# Patient Record
Sex: Male | Born: 1960 | Race: White | Hispanic: No | Marital: Married | Smoking: Former smoker
Health system: Southern US, Community
[De-identification: ages and names within clinical notes are randomized; demographics above are authoritative.]

## PROBLEM LIST (undated history)

## (undated) DIAGNOSIS — K219 Gastro-esophageal reflux disease without esophagitis: Secondary | ICD-10-CM

## (undated) DIAGNOSIS — I214 Non-ST elevation (NSTEMI) myocardial infarction: Secondary | ICD-10-CM

## (undated) DIAGNOSIS — E349 Endocrine disorder, unspecified: Secondary | ICD-10-CM

## (undated) DIAGNOSIS — N529 Male erectile dysfunction, unspecified: Secondary | ICD-10-CM

## (undated) DIAGNOSIS — I25810 Atherosclerosis of coronary artery bypass graft(s) without angina pectoris: Secondary | ICD-10-CM

## (undated) DIAGNOSIS — K2270 Barrett's esophagus without dysplasia: Secondary | ICD-10-CM

## (undated) DIAGNOSIS — J411 Mucopurulent chronic bronchitis: Secondary | ICD-10-CM

## (undated) DIAGNOSIS — I10 Essential (primary) hypertension: Secondary | ICD-10-CM

## (undated) DIAGNOSIS — I208 Other forms of angina pectoris: Secondary | ICD-10-CM

## (undated) DIAGNOSIS — Z951 Presence of aortocoronary bypass graft: Principal | ICD-10-CM

## (undated) DIAGNOSIS — M109 Gout, unspecified: Secondary | ICD-10-CM

## (undated) DIAGNOSIS — I255 Ischemic cardiomyopathy: Secondary | ICD-10-CM

## (undated) DIAGNOSIS — I8390 Asymptomatic varicose veins of unspecified lower extremity: Secondary | ICD-10-CM

## (undated) DIAGNOSIS — R7302 Impaired glucose tolerance (oral): Secondary | ICD-10-CM

## (undated) DIAGNOSIS — Z87891 Personal history of nicotine dependence: Secondary | ICD-10-CM

## (undated) DIAGNOSIS — I2510 Atherosclerotic heart disease of native coronary artery without angina pectoris: Secondary | ICD-10-CM

## (undated) DIAGNOSIS — E785 Hyperlipidemia, unspecified: Secondary | ICD-10-CM

## (undated) HISTORY — DX: Atherosclerotic heart disease of native coronary artery without angina pectoris: I25.10

## (undated) HISTORY — DX: Endocrine disorder, unspecified: E34.9

## (undated) HISTORY — DX: Hyperlipidemia, unspecified: E78.5

## (undated) HISTORY — DX: Atherosclerosis of coronary artery bypass graft(s) without angina pectoris: I25.810

## (undated) HISTORY — DX: Non-ST elevation (NSTEMI) myocardial infarction: I21.4

## (undated) HISTORY — DX: Personal history of nicotine dependence: Z87.891

## (undated) HISTORY — DX: Barrett's esophagus without dysplasia: K22.70

## (undated) HISTORY — DX: Male erectile dysfunction, unspecified: N52.9

## (undated) HISTORY — DX: Presence of aortocoronary bypass graft: Z95.1

## (undated) HISTORY — DX: Mucopurulent chronic bronchitis: J41.1

## (undated) HISTORY — DX: Gastro-esophageal reflux disease without esophagitis: K21.9

## (undated) HISTORY — DX: Gout, unspecified: M10.9

## (undated) HISTORY — PX: ESOPHAGOGASTRODUODENOSCOPY: SHX1529

## (undated) HISTORY — DX: Impaired glucose tolerance (oral): R73.02

## (undated) HISTORY — DX: Asymptomatic varicose veins of unspecified lower extremity: I83.90

## (undated) HISTORY — DX: Essential (primary) hypertension: I10

---

## 2001-09-29 ENCOUNTER — Encounter: Payer: Self-pay | Admitting: *Deleted

## 2001-09-29 ENCOUNTER — Ambulatory Visit (HOSPITAL_COMMUNITY): Admission: RE | Admit: 2001-09-29 | Discharge: 2001-09-29 | Payer: Self-pay | Admitting: *Deleted

## 2004-01-30 DIAGNOSIS — Z951 Presence of aortocoronary bypass graft: Secondary | ICD-10-CM

## 2004-01-30 HISTORY — DX: Presence of aortocoronary bypass graft: Z95.1

## 2004-02-22 ENCOUNTER — Inpatient Hospital Stay (HOSPITAL_COMMUNITY): Admission: EM | Admit: 2004-02-22 | Discharge: 2004-03-03 | Payer: Self-pay | Admitting: Emergency Medicine

## 2004-02-22 DIAGNOSIS — I214 Non-ST elevation (NSTEMI) myocardial infarction: Secondary | ICD-10-CM

## 2004-02-22 DIAGNOSIS — I2510 Atherosclerotic heart disease of native coronary artery without angina pectoris: Secondary | ICD-10-CM

## 2004-02-22 HISTORY — DX: Non-ST elevation (NSTEMI) myocardial infarction: I21.4

## 2004-02-22 HISTORY — DX: Atherosclerotic heart disease of native coronary artery without angina pectoris: I25.10

## 2004-02-27 HISTORY — PX: CORONARY ARTERY BYPASS GRAFT: SHX141

## 2004-03-20 ENCOUNTER — Encounter: Admission: RE | Admit: 2004-03-20 | Discharge: 2004-03-20 | Payer: Self-pay | Admitting: Cardiothoracic Surgery

## 2004-08-07 ENCOUNTER — Ambulatory Visit (HOSPITAL_COMMUNITY): Admission: RE | Admit: 2004-08-07 | Discharge: 2004-08-07 | Payer: Self-pay | Admitting: Cardiology

## 2005-05-08 ENCOUNTER — Encounter: Admission: RE | Admit: 2005-05-08 | Discharge: 2005-05-08 | Payer: Self-pay | Admitting: Cardiology

## 2005-05-21 ENCOUNTER — Ambulatory Visit: Payer: Self-pay | Admitting: Internal Medicine

## 2005-06-11 ENCOUNTER — Encounter (INDEPENDENT_AMBULATORY_CARE_PROVIDER_SITE_OTHER): Payer: Self-pay | Admitting: Specialist

## 2005-06-11 ENCOUNTER — Ambulatory Visit: Payer: Self-pay | Admitting: Internal Medicine

## 2007-06-20 ENCOUNTER — Emergency Department (HOSPITAL_COMMUNITY): Admission: EM | Admit: 2007-06-20 | Discharge: 2007-06-21 | Payer: Self-pay | Admitting: *Deleted

## 2007-12-30 DIAGNOSIS — R7302 Impaired glucose tolerance (oral): Secondary | ICD-10-CM

## 2007-12-30 HISTORY — DX: Impaired glucose tolerance (oral): R73.02

## 2008-06-20 LAB — HEMOGLOBIN A1C: A1c: 5.9

## 2009-12-29 HISTORY — PX: VEIN SURGERY: SHX48

## 2010-12-22 ENCOUNTER — Inpatient Hospital Stay (HOSPITAL_COMMUNITY)
Admission: EM | Admit: 2010-12-22 | Discharge: 2010-12-26 | Payer: Self-pay | Source: Home / Self Care | Attending: Cardiology | Admitting: Cardiology

## 2010-12-25 HISTORY — PX: CORONARY ANGIOPLASTY WITH STENT PLACEMENT: SHX49

## 2011-03-10 LAB — BASIC METABOLIC PANEL
BUN: 7 mg/dL (ref 6–23)
BUN: 8 mg/dL (ref 6–23)
Chloride: 102 mEq/L (ref 96–112)
Chloride: 104 mEq/L (ref 96–112)
Chloride: 105 mEq/L (ref 96–112)
Creatinine, Ser: 0.9 mg/dL (ref 0.4–1.5)
Creatinine, Ser: 0.98 mg/dL (ref 0.4–1.5)
GFR calc Af Amer: >60 mL/min (ref >60)
GFR calc non Af Amer: >60 mL/min (ref >60)
GFR calc non Af Amer: >60 mL/min (ref >60)
Glucose, Bld: 105 mg/dL — ABNORMAL HIGH (ref 70–99)
Potassium: 3.7 mEq/L (ref 3.5–5.1)
Potassium: 3.9 mEq/L (ref 3.5–5.1)
Potassium: 3.9 mEq/L (ref 3.5–5.1)
Sodium: 140 mEq/L (ref 135–145)

## 2011-03-10 LAB — COMPREHENSIVE METABOLIC PANEL
ALT: 30 U/L (ref 0–53)
AST: 59 U/L — ABNORMAL HIGH (ref 0–37)
Alkaline Phosphatase: 61 U/L (ref 39–117)
CO2: 23 mEq/L (ref 19–32)
Calcium: 8.6 mg/dL (ref 8.4–10.5)
GFR calc Af Amer: >60 mL/min (ref >60)
GFR calc non Af Amer: >60 mL/min (ref >60)
Glucose, Bld: 99 mg/dL (ref 70–99)
Potassium: 4 mEq/L (ref 3.5–5.1)
Sodium: 138 mEq/L (ref 135–145)
Total Protein: 6.1 g/dL (ref 6.0–8.3)

## 2011-03-10 LAB — LIPID PANEL
Cholesterol: 190 mg/dL (ref 0–200)
HDL: 36 mg/dL — ABNORMAL LOW (ref >39)
LDL Cholesterol: 111 mg/dL — ABNORMAL HIGH (ref 0–99)
Total CHOL/HDL Ratio: 5.3 RATIO
VLDL: 43 mg/dL — ABNORMAL HIGH (ref 0–40)

## 2011-03-10 LAB — POCT I-STAT, CHEM 8
BUN: 8 mg/dL (ref 6–23)
Calcium, Ion: 1.12 mmol/L (ref 1.12–1.32)
Chloride: 104 mEq/L (ref 96–112)
Creatinine, Ser: 1 mg/dL (ref 0.4–1.5)
Glucose, Bld: 105 mg/dL — ABNORMAL HIGH (ref 70–99)
TCO2: 28 mmol/L (ref 0–100)

## 2011-03-10 LAB — CBC
HCT: 35.2 % — ABNORMAL LOW (ref 39.0–52.0)
HCT: 36.9 % — ABNORMAL LOW (ref 39.0–52.0)
HCT: 36.9 % — ABNORMAL LOW (ref 39.0–52.0)
HCT: 37.8 % — ABNORMAL LOW (ref 39.0–52.0)
HCT: 41 % (ref 39.0–52.0)
Hemoglobin: 12.7 g/dL — ABNORMAL LOW (ref 13.0–17.0)
Hemoglobin: 14 g/dL (ref 13.0–17.0)
MCH: 28.1 pg (ref 26.0–34.0)
MCH: 28.5 pg (ref 26.0–34.0)
MCHC: 33.5 g/dL (ref 30.0–36.0)
MCV: 83.5 fL (ref 78.0–100.0)
MCV: 83.8 fL (ref 78.0–100.0)
MCV: 83.9 fL (ref 78.0–100.0)
MCV: 84.7 fL (ref 78.0–100.0)
MCV: 84.8 fL (ref 78.0–100.0)
Platelets: 181 10*3/uL (ref 150–400)
Platelets: 197 10*3/uL (ref 150–400)
RBC: 4.4 MIL/uL (ref 4.22–5.81)
RBC: 4.42 MIL/uL (ref 4.22–5.81)
RBC: 4.46 MIL/uL (ref 4.22–5.81)
RDW: 13.4 % (ref 11.5–15.5)
RDW: 13.4 % (ref 11.5–15.5)
RDW: 13.4 % (ref 11.5–15.5)
WBC: 8.1 10*3/uL (ref 4.0–10.5)
WBC: 8.2 10*3/uL (ref 4.0–10.5)
WBC: 8.3 10*3/uL (ref 4.0–10.5)
WBC: 8.9 10*3/uL (ref 4.0–10.5)

## 2011-03-10 LAB — HEMOCCULT GUIAC POC 1CARD (OFFICE): Fecal Occult Bld: NEGATIVE

## 2011-03-10 LAB — DRUGS OF ABUSE SCREEN W/O ALC, ROUTINE URINE
Barbiturate Quant, Ur: NEGATIVE
Benzodiazepines.: NEGATIVE
Cocaine Metabolites: NEGATIVE
Methadone: NEGATIVE
Opiate Screen, Urine: NEGATIVE
Phencyclidine (PCP): NEGATIVE

## 2011-03-10 LAB — HEPARIN LEVEL (UNFRACTIONATED)
Heparin Unfractionated: 0.12 IU/mL — ABNORMAL LOW (ref 0.30–0.70)
Heparin Unfractionated: 0.3 IU/mL (ref 0.30–0.70)
Heparin Unfractionated: 0.36 IU/mL (ref 0.30–0.70)

## 2011-03-10 LAB — POCT CARDIAC MARKERS
Myoglobin, poc: 317 ng/mL (ref 12–200)
Troponin i, poc: 0.12 ng/mL — ABNORMAL HIGH (ref 0.00–0.09)
Troponin i, poc: <0.05 ng/mL (ref 0.00–0.09)

## 2011-03-10 LAB — CK TOTAL AND CKMB (NOT AT ARMC)
CK, MB: 40.8 ng/mL (ref 0.3–4.0)
Relative Index: 9.2 — ABNORMAL HIGH (ref 0.0–2.5)
Total CK: 578 U/L — ABNORMAL HIGH (ref 7–232)

## 2011-03-10 LAB — DIFFERENTIAL
Basophils Absolute: 0.1 10*3/uL (ref 0.0–0.1)
Eosinophils Relative: 7 % — ABNORMAL HIGH (ref 0–5)
Lymphocytes Relative: 33 % (ref 12–46)
Lymphs Abs: 2.7 10*3/uL (ref 0.7–4.0)
Monocytes Absolute: 0.5 10*3/uL (ref 0.1–1.0)
Monocytes Relative: 6 % (ref 3–12)
Neutro Abs: 4.4 10*3/uL (ref 1.7–7.7)

## 2011-03-10 LAB — MRSA PCR SCREENING
MRSA by PCR: NEGATIVE
MRSA by PCR: NEGATIVE

## 2011-03-10 LAB — CARDIAC PANEL(CRET KIN+CKTOT+MB+TROPI)
Relative Index: 14.5 — ABNORMAL HIGH (ref 0.0–2.5)
Relative Index: 2.3 (ref 0.0–2.5)
Relative Index: 6.5 — ABNORMAL HIGH (ref 0.0–2.5)
Total CK: 1282 U/L — ABNORMAL HIGH (ref 7–232)
Total CK: 835 U/L — ABNORMAL HIGH (ref 7–232)
Troponin I: 5.3 ng/mL (ref 0.00–0.06)

## 2011-03-10 LAB — URINALYSIS, ROUTINE W REFLEX MICROSCOPIC
Glucose, UA: NEGATIVE mg/dL
Hgb urine dipstick: NEGATIVE
Ketones, ur: NEGATIVE mg/dL
Protein, ur: NEGATIVE mg/dL
Urobilinogen, UA: 0.2 mg/dL (ref 0.0–1.0)

## 2011-03-10 LAB — TROPONIN I: Troponin I: 11.05 ng/mL (ref 0.00–0.06)

## 2011-03-10 LAB — PROTIME-INR
INR: 1.02 (ref 0.00–1.49)
Prothrombin Time: 13.6 seconds (ref 11.6–15.2)

## 2011-03-10 LAB — HEMOGLOBIN A1C: Hgb A1c MFr Bld: 6 % — ABNORMAL HIGH (ref <5.7)

## 2011-04-28 ENCOUNTER — Ambulatory Visit
Admission: RE | Admit: 2011-04-28 | Discharge: 2011-04-28 | Disposition: A | Discharge status: Home / Self Care | Payer: 59 | Source: Ambulatory Visit | Attending: Cardiology | Admitting: Cardiology

## 2011-04-28 ENCOUNTER — Other Ambulatory Visit: Payer: Self-pay | Admitting: Cardiology

## 2011-04-28 DIAGNOSIS — Z01811 Encounter for preprocedural respiratory examination: Secondary | ICD-10-CM

## 2011-04-28 DIAGNOSIS — R0602 Shortness of breath: Secondary | ICD-10-CM

## 2011-05-01 ENCOUNTER — Observation Stay (HOSPITAL_COMMUNITY)
Admission: RE | Admit: 2011-05-01 | Discharge: 2011-05-02 | Disposition: A | Discharge status: Home / Self Care | Payer: 59 | Source: Ambulatory Visit | Attending: Cardiology | Admitting: Cardiology

## 2011-05-01 DIAGNOSIS — I10 Essential (primary) hypertension: Secondary | ICD-10-CM | POA: Insufficient documentation

## 2011-05-01 DIAGNOSIS — I25810 Atherosclerosis of coronary artery bypass graft(s) without angina pectoris: Principal | ICD-10-CM | POA: Insufficient documentation

## 2011-05-01 DIAGNOSIS — I872 Venous insufficiency (chronic) (peripheral): Secondary | ICD-10-CM | POA: Insufficient documentation

## 2011-05-01 DIAGNOSIS — E785 Hyperlipidemia, unspecified: Secondary | ICD-10-CM | POA: Insufficient documentation

## 2011-05-01 HISTORY — PX: CARDIAC CATHETERIZATION: SHX172

## 2011-05-02 LAB — BASIC METABOLIC PANEL
BUN: 8 mg/dL (ref 6–23)
CO2: 26 mEq/L (ref 19–32)
Calcium: 8.9 mg/dL (ref 8.4–10.5)
Chloride: 105 mEq/L (ref 96–112)
Creatinine, Ser: 0.88 mg/dL (ref 0.4–1.5)
GFR calc Af Amer: >60 mL/min (ref >60)
GFR calc non Af Amer: >60 mL/min (ref >60)
Glucose, Bld: 114 mg/dL — ABNORMAL HIGH (ref 70–99)
Potassium: 4.1 mEq/L (ref 3.5–5.1)
Sodium: 137 mEq/L (ref 135–145)

## 2011-05-02 LAB — CBC
MCH: 27 pg (ref 26.0–34.0)
MCHC: 33.2 g/dL (ref 30.0–36.0)
MCV: 81.3 fL (ref 78.0–100.0)
Platelets: 162 10*3/uL (ref 150–400)
RBC: 4.71 MIL/uL (ref 4.22–5.81)
RDW: 14.3 % (ref 11.5–15.5)

## 2011-05-15 NOTE — Cardiovascular Report (Signed)
Calvin Francis, Calvin Francis                ACCOUNT NO.:  [id number]  MEDICAL RECORD NO.:  [id number]           PATIENT TYPE:  O  LOCATION:  6531                         FACILITY:  [facility]  PHYSICIAN:  Thereasa Solo. Little, M.D. DATE OF BIRTH:  May 12, 1961  DATE OF PROCEDURE:  05/01/2011 DATE OF DISCHARGE:                           CARDIAC CATHETERIZATION   INDICATIONS FOR TEST:  This 50 year old male had bypass surgery in 2005. He had previous loss of a vein graft to his right coronary artery with left-to-right collaterals.  He was hospitalized in December 2011 with unstable angina, had a Vision stent placed to the vein graft to his OM system.  Unfortunately, he continued to have angina at least 3 times a week to his nitroglycerin responsive, because of this he was brought in for repeat cardiac catheterization.  PROCEDURE:  After obtaining informed consent, the patient was prepped and draped in the usual sterile fashion exposing the right groin, following local anesthetic with 1% Xylocaine the Seldinger technique was employed and a 5-French introducer sheath was placed in the right femoral artery, graft visualization x4, and left coronary arteriography was performed.  COMPLICATIONS:  None.  TOTAL CONTRAST USED:  115 mL  RESULTS:  Central aortic pressure was 119/79.  CORONARY ARTERIOGRAPHY:  The right coronary artery was occluded on previous cath. 1. No ventriculogram was performed. 2. Vein graft to the RCA was a 100% occluded his ostium. 3. Saphenous vein graft to OM #1 was occluded just past the ostium and     there was a small B-type area leading into the vein graft.  This is     the same vein graft and underwent intervention in December 2011 and     you could see a stent in the more proximal portion of this vessel. 4. Free radial to OM #2, this graft was widely patent.  OM #2 was free     of disease. 5. Internal mammary artery to the LAD.  The internal mammary artery     was patent.   The LAD itself, however , was small with 70 and 80%     sequential areas in the mid and distal segment.  This area is too     small for any type of intervention.  This is unchanged from his     cath December 2011.  1. Left main normal, bifurcating 2. Circumflex.  The circumflex was 95% occluded at the takeoff of OM     #1.  OM #1 itself was a totally occluded.  OM #2 was not visualized     to the native circulation and the ongoing circumflex stayed in the     AV groove and was small.  There were left-to-right collaterals     noted. 3. LAD.  The LAD was a 100% occluded after the first diagonal and     septal perforator.  The proximal LAD was free of significant     disease.  The diagonal was relatively long, but small in diameter     with moderate irregularities.  Because of what appeared to be a more recent obstruction of the vein  graft to the OM #1, the decision was made to try to intervene upon this. The patient was given IV Angiomax, had an ACT of 393, the system was upgraded to 6-French.  A variety of guide catheter was used, a JR-3.5, a JR-4 and LCB and finally an Amplatz catheter which was the best of the guide catheters.  A short Luge wire of [employer] and finally Miracle Brothers wire was used. The Miracle Brother wire with the Amplatz catheter gave me adequate backup, so that I could place the wire to pass the obstruction about 10 mm.  At this point, the guidewire would stop and back out the guide catheter.  I was able to pass into this area about 5 different times.  I was never able to visualize any type of channel or canal that had been accomplished with this wire and I could never see any distal flow down the graft.  At this point, the procedure was terminated.  There was no evidence of a dissection and there was no evidence of any perforation.  The patient will be treated medically.  We will start him on Ranexa 500 mg in addition to his outpatient medicines.           ______________________________ Thereasa Solo Little, M.D.     ABL/MEDQ  D:  05/01/2011  T:  05/01/2011  Job:  [id number]  cc:   Verline Lema  Electronically Signed by Julieanne Manson M.D. on 05/15/2011 03:51:17 PM

## 2011-05-15 NOTE — Discharge Summary (Signed)
NAMEALMON, Calvin Francis                ACCOUNT NO.:  [id number]  MEDICAL RECORD NO.:  [id number]           Francis TYPE:  O  LOCATION:  6531                         FACILITY:  [facility]  PHYSICIAN:  Thereasa Solo. Little, M.D. DATE OF BIRTH:  1961-02-17  DATE OF ADMISSION:  05/01/2011 DATE OF DISCHARGE:  05/02/2011                              DISCHARGE SUMMARY   DISCHARGE DIAGNOSES: 1. Recurrent angina. 2. Coronary artery disease with bypass grafting in 2005.     a.     Known loss of vein graft to Calvin right coronary artery with      left-to-right collaterals.     b.     Vision stent placed to Calvin vein graft to his obtuse marginal      in December 2011.     c.     Unsuccessful percutaneous coronary intervention to Calvin vein      graft to obtuse marginal 1 medical therapy now. 3. Premature ventricular contractions with increased dose of     metoprolol. 4. Hypertension. 5. Dyslipidemia. 6. Venous disease with improved venous insufficiency to his lower     extremity.  Discharge condition is stable.  PROCEDURES:  Combined left heart cath and attempted PCI by Dr. Julieanne Manson.  HOSPITAL COURSE:  Calvin Francis was brought in electively for cardiac catheterization to further evaluate his coronary anatomy as he was having increasing episodes of Calvin angina with chest pressure, shortness of breath with any type of sustained activity occurring at least 3 times a week and occasionally nitroglycerin responsive.  There was a crescendo- type response of Calvin angina.  It was felt due to his unknown disease and vein graft dysfunction after his bypass grafting, Calvin cath would be Calvin best test for Calvin Francis.  He was brought in, was found to have further graft dysfunction, PCI was attempted but was unsuccessful.  Please see Dr. Fredirick Maudlin dictated note.  Calvin Francis was able to ambulate Calvin next morning without complications.  We did add Ranexa to his medical regimen.  He did have PVCs on his telemetry  monitor couplets and his Lopressor was increased to 37.5 mg twice a day.  DISCHARGE INSTRUCTIONS: 1. Increase activity slowly.  May shower.  No lifting for 3 days.  No     driving for 3 days.  May return to work May 07, 2011. 2. Low-sodium heart-healthy diet. 3. Pick up Ranexa samples at our office. 4. Wash cath site with soap and water.  Call if any bleeding, swelling     or drainage. 5. Follow up with Dr. Clarene Duke May 14, 2011, at 11:30 a.m.  DISCHARGE MEDICATIONS:  See medication reconciliation sheet.  Calvin Francis was seen by Dr. Bryan Lemma on Calvin morning of discharge.  LABORATORY FINDINGS:  Sodium 137, potassium 4.1, BUN 8, creatinine 0.88, glucose 114.  Hemoglobin 12.7, hematocrit 38.3, platelets 162 and WBC 7.5.  Blood pressure 118/79, pulse 69, respiratory rate is 11 and temperature 97.6.  Oxygen saturation on room air was 96%.  Heart regular rate and rhythm.  S1 and S2 without murmur, gallop, rub or click.  Lungs are clear  to auscultation bilaterally.  Abdomen is soft, nontender. Positive bowel sounds.  Extremities, left lower extremity varicose veins.  Right groin site without hematoma.  PLAN:  Medical therapy as stated.     Darcella Gasman. Ingold, N.P.   ______________________________ Thereasa Solo Little, M.D.    LRI/MEDQ  D:  05/02/2011  T:  05/02/2011  Job:  [id number]  Electronically Signed by Nada Boozer N.P. on 05/05/2011 03:46:24 PM Electronically Signed by Julieanne Manson M.D. on 05/15/2011 03:51:15 PM

## 2011-05-16 NOTE — Cardiovascular Report (Signed)
NAME:  Calvin Francis, Calvin Francis                          ACCOUNT NO.:  [id number]   MEDICAL RECORD NO.:  [id number]                   PATIENT TYPE:  INP   LOCATION:  4734                                 FACILITY:  [facility]   PHYSICIAN:  Thereasa Solo. Little, M.D.              DATE OF BIRTH:  07-06-1961   DATE OF PROCEDURE:  02/23/2004  DATE OF DISCHARGE:                              CARDIAC CATHETERIZATION   INDICATIONS FOR TEST:  This 50 year old male on February 20, 2004 developed  chest discomfort that resolved when he went to bed.  He awoke on February 24  and had reoccurrence of the chest discomfort that sounded relatively classic  for angina.  He presented to the emergency room  and was pain free.  His  cardiac enzymes were low positive and his EKG was completely normal.  He was  started on IV integrilin and heparin last night and brought to the cath lab  this morning after sustaining what appears to be a non-Q-wave myocardial  infarction.  His only risk factor is cigarette smoking.   After obtaining informed consent, the patient was prepped and draped in the  usual sterile fashion.  Following local anesthetic with 1% Xylocaine, the  Seldinger technique was employed and a 5 [place] introducer sheath was placed  into his right femoral artery.  Left and right coronary arteriography,  ventriculography, distal aortogram and evaluation of the internal mammary  artery was done.   RESULTS:   HEMODYNAMIC MONITORING:  Central aortic pressure was 120/82.  Left  ventricular pressure was 121/16.   VENTRICULOGRAPHY:  Ventriculography in the RAO projection revealed the  inferior wall to be hypokinetic.  The anterior wall appeared to have normal  to slightly hyperdynamic function.  The ejection fraction was 45-50%.  End-  diastolic pressure was 20.   DISTAL AORTOGRAM:  No abdominal aortic aneurysm was seen.   LEFT SUBCLAVIAN/INTERNAL MAMMARY ARTERY:  Subclavian was without disease.  Internal mammary  artery was widely patent.   CORONARY ARTERIOGRAPHY:  Calcification in the proximal LAD and left main was  seen on fluoroscopy.   1. Left main normal.  2. LAD:  The left anterior descending cross the apex of the heart.  In the     mid portion of the LAD at the bifurcation of the second diagonal was an     area of 60% narrowing involving both the LAD and the diagonal itself.     The distal portion of the LAD had mild irregularities.  The first     diagonal had an area of 40% narrowing.  3. Circumflex:  The circumflex had some aneurysmal dilatation in its mid     portion.  There was a long proximal segment of 70-80% narrowing.  OM-1     was 100% occluded and was probably the culprit vessel for his event 48     hours earlier.  The ongoing circumflex after  OM-1 had an area of 80%     narrowing and this gave rise to an OM-2.  4. Right coronary artery:  The right coronary artery was 100% occluded in     its mid portion.  There was a large left-to-right collateral bed seen.   CONCLUSION:  1. Three-vessel coronary disease with total occlusion of the right with left-     to-right collaterals, high grade stenosis in the left anterior     descending, high grade stenosis in the circumflex and total occlusion of     a large marginal vessel.  2. Mild left ventricular dysfunction with an ejection fraction of around 45-     50%.  3. Patent internal mammary artery.   Despite his young age, the diffuseness of his disease will require bypass  surgery.  CVTS will see the patient.  I have restarted the heparin and will  continue integrilin.                                               Thereasa Solo. Little, M.D.    ABL/MEDQ  D:  02/23/2004  T:  02/24/2004  Job:  [id number]   cc:   CVTS   [facility]

## 2011-05-16 NOTE — Op Note (Signed)
NAME:  CLARE, CASTO NO.:  [id number]   MEDICAL RECORD NO.:  [id number]                   PATIENT TYPE:  INP   LOCATION:  2314                                 FACILITY:  [facility]   PHYSICIAN:  Kerin Perna III, M.D.           DATE OF BIRTH:  [birth year]   DATE OF PROCEDURE:  02/27/2004  DATE OF DISCHARGE:                                 OPERATIVE REPORT   OPERATION:  Coronary artery bypass grafting x 4 (left internal mammary  artery to LAD, right radial artery graft to OM2, saphenous vein graft to  OM1, saphenous vein graft to right coronary artery ).   Dictation ended at this point.                                               Mikey Bussing, M.D.    PV/MEDQ  D:  02/27/2004  T:  02/28/2004  Job:  [id number]

## 2011-05-16 NOTE — Op Note (Signed)
NAME:  Calvin Francis, Calvin Francis NO.:  [id number]   MEDICAL RECORD NO.:  [id number]                   PATIENT TYPE:  INP   LOCATION:  2314                                 FACILITY:  [facility]   PHYSICIAN:  Kerin Perna III, M.D.           DATE OF BIRTH:  12/02/1961   DATE OF PROCEDURE:  02/27/2004  DATE OF DISCHARGE:                                 OPERATIVE REPORT   OPERATION PERFORMED:  Coronary artery bypass grafting time four (left  internal mammary artery to left anterior descending, right radial artery  graft to obtuse marginal 2, saphenous vein graft to obtuse marginal 1,  saphenous vein graft to right coronary artery).   PREOPERATIVE DIAGNOSIS:  Class IV unstable angina with severe three-vessel  coronary artery disease.   POSTOPERATIVE DIAGNOSIS:  Class IV unstable angina with severe three-vessel  coronary artery disease.   SURGEON:  Kerin Perna, M.D.   ASSISTANT:  1. Salvatore Decent. Dorris Fetch, M.D.  2. Rowe Clack, P.A.-C.   ANESTHESIA:  General.   ANESTHESIOLOGIST:  Zenon Mayo, MD   INDICATIONS FOR PROCEDURE:  The patient is a 50 year old white male smoker  who presented with symptoms of unstable angina and he had mild elevation of  cardiac enzymes.  Cardiac catheterization demonstrated severe three-vessel  coronary artery disease with ejection fraction of 40%.  He was felt to be a  candidate for surgical revascularization.  Prior to surgery, I examined the  patient in his hospital room and reviewed the results of the cardiac  catheterization with the patient and his family.  I discussed the  indications and expected benefits of coronary artery bypass surgery for  treatment of his coronary artery disease.  I discussed the alternatives to  surgical therapy for treatment of his coronary artery disease.  I reviewed  with the patient the major aspects of the proposed procedure including the  choice of conduit to include radial artery,  mammary artery, and saphenous  vein, the location of the surgical incisions, the use of general anesthesia,  and cardiopulmonary bypass, and the expected postoperative hospital  recovery.  He understood that we would delay the surgery for four days to  allow the effect of his Plavix load to resolve to reduce bleeding  complications.  He understood the risks associated with this operation to  him including the risks of MI, CVA, bleeding, blood transfusion requirement,  infection and death.  The patient understood these implications for the  surgery and agreed to proceed with the operation as planned under what I  felt was an informed consent.   OPERATIVE FINDINGS:  The mammary artery was a good vessel with excellent  flow.  The right radial artery was a good vessel with excellent flow.  The  saphenous vein was harvested from the right leg using an open technique.  There is very little saphenous vein remaining in right leg and the left  leg  saphenous vein  is severely diseased with varicosities.  The patient  received a unit of platelets in the operating room for post pump  coagulopathy consistent with his preoperative use of Plavix.  He received  blood transfusion in the operating room for a hemoglobin of 7 g.  The  diagonal vessel was too small and diffusely diseased to graft.   DESCRIPTION OF PROCEDURE:  The patient was brought to the operating room and  placed supine on the operating table where general anesthesia was induced  under invasive hemodynamic monitoring.  The chest, abdomen and right arm  were prepped with Betadine and draped as a sterile field.  The radial artery  was harvested using a right forearm incision using the [employer] scalpel.  Prior to removing the radial artery, the patency of the palmar arch was  confirmed with the use of intraoperative Doppler ultrasound.  Radial artery  was harvested and flushed with a papaverine heparin solution.  The arm  incision was  closed in two layers.  A sterile dressing was applied and the  right arm was tucked to the patient's side.   A sternal incision was then made as the saphenous vein was harvested from  the right thigh.  The left internal mammary artery was harvested as a  pedicle graft from its origin at the subclavian vessels and was a good  vessel with excellent flow.  Heparin was administered and the ACT was  documented as being therapeutic.  The sternal retractor was placed.  The  pericardium was opened. Through pursestrings placed in the ascending aorta  and right atrium, the patient was cannulated and placed on bypass and cooled  to 32 degrees.  The coronaries were identified and grafted.  The LAD had  diffuse disease.  The two circumflex vessels were intramyocardial.  The  right coronary artery had diffuse disease and distally, the posterior  descending was too small to graft and the vein graft was placed to the  distal right coronary artery proper.  The mammary artery, radial artery, and  saphenous vein grafts were prepared for the distal anastomoses and the  cardioplegia cannula was placed in the ascending aorta.  The patient was  cooled to 30 degrees.  The aortic cross-clamp was applied.  750 mL of cold  blood cardioplegia was delivered to the aortic root with good cardioplegic  arrest and septal temperature dropped less than 12 degrees.  Topical iced  saline was used to augment myocardial preservation and a pericardial  insulator pad was used to protect the left phrenic nerve.  The distal  coronary anastomoses were then performed.   The first distal anastomosis was to the distal right.  This was totally  occluded proximally.  There was a 1.7 mm sclerotic vessel.  Reversed  saphenous vein was sewn end-to-side with running 7-0 Prolene.  There was  good flow through the graft.  The second distal anastomosis was the OM1. This was a totally occluded intramyocardial vessel.  It was heavily  diseased.   A reversed saphenous vein was sewn end-to-side with running 7-0  Prolene.  There was good flow through the graft.  Cardioplegia was redosed.  The third distal anastomosis was to the obtuse marginal 2.  This was a 1.7  mm vessel with proximal 90% stenosis.  It was intramyocardial.  A radial  artery graft was sewn end-to-side with running 8-0 Prolene. There was good  flow to the graft.  The fourth distal anastomosis was the  distal third of  the LAD.  This was a thickened sclerotic vessel with diffuse disease.  The  left internal mammary artery pedicle was brought through an opening created  in the left lateral pericardium and was brought down onto the LAD and sewn  end-to-side with running 8-0 Prolene.  There was excellent flow through the  anastomosis with immediate rise in septal temperature after release of the  pedicle clamp on the mammary artery.  The mammary pedicle was secured to the  epicardium and the aortic cross-clamp was removed.   The heart resumed a spontaneous rhythm.  Using a partial occlusion clamp,  the proximal anastomoses were placed using the radial artery for the  superior graft and two vein grafts for the middle and lower grafts.  The  partial clamp was removed.  The vein graft and radial artery graft were  perfused.  All grafts had good flow and hemostasis was documented at the  proximal and distal sites.  The patient was rewarmed to 37 degrees.  The  lungs were re-expanded and the ventilator was resumed.  The patient was  weaned from bypass without difficulty.  Blood pressure and cardiac output  were stable.  Protamine was administered without adverse reaction. Cannulas  were removed.  The mediastinum was irrigated with warm antibiotic  irrigation.  The patient remained stable.  The leg incision was irrigated  and closed in a standard fashion.  The superior pericardial fat was closed  over the aorta.  Two mediastinal and a left pleural chest tube were placed  and  brought out through separate incisions.  The sternum was closed with  interrupted steel wire. The pectoralis fascia was closed with a running #1  Vicryl.  The subcutaneous and skin layers were closed with a running Vicryl  and sterile dressings were applied.  Total bypass time was 130 minutes.  Cross-clamp time was 55 minutes.                                               Mikey Bussing, M.D.    PV/MEDQ  D:  02/27/2004  T:  02/28/2004  Job:  [id number]   cc:   Thereasa Solo. Little, M.D.  [address]  [address]  [place]  [place] [address]  Fax: [phone]

## 2011-05-16 NOTE — Consult Note (Signed)
NAME:  ROBEY, MASSMANN NO.:  [id number]   MEDICAL RECORD NO.:  [id number]                   PATIENT TYPE:  INP   LOCATION:  4734                                 FACILITY:  [facility]   PHYSICIAN:  Mikey Bussing, M.D.           DATE OF BIRTH:  Oct 19, 1961   DATE OF CONSULTATION:  DATE OF DISCHARGE:                                   CONSULTATION   PHYSICIAN REQUESTING CONSULTATION:  Thereasa Solo. Little, M.D.   PRIMARY CARE PHYSICIAN:  [facility]).   CHIEF COMPLAINT:  Chest pain.   REASON FOR CONSULTATION:  Subendocardial MI with class 4 unstable angina and  three vessel coronary disease.   HISTORY OF PRESENT ILLNESS:  I was asked to evaluate this 50 year old white  male smoker for potential surgical coronary revascularization for recently  diagnosed severe three vessel coronary artery disease.  The patient has a  long history of GERD and keeps Tums at his bedside.  He presented to the  emergency room after experiencing nocturnal chest pain that was not relieved  by antacids or rest on the evening of February 22, 2004.  An EKG at the  emergency department showed no acute changes, however, his cardiac enzymes  were mildly positive, a CPK-MB 11.2 and a troponin of 0.3.  He was admitted  and placed on heparin, nitroglycerin and Integrilin and was given Plavix  times two doses.  He underwent cardiac catheterization today which  demonstrated ejection fraction of 40%, a proximal 80% stenosis of the  circumflex, occlusion of the OM 1, 75% stenosis of the LAD with diffuse  distal disease, and chronic occlusion of the right coronary.  Left  ventricular end diastolic pressure is 20 mm/Hg and there is no mitral  regurgitation.  Ejection fraction was 45%.  He was felt to be a candidate  for surgical coronary revascularization.   PAST MEDICAL HISTORY:  1. Positive smoker, one pack per day times 20 years.  2. Right hand dominant.  3. Long history  of GERD.  4. Varicose veins with severe venous insufficiency of the left lower leg.  5. No known drug allergies.   HOME MEDICATIONS:  Mobic 15 mg p.o. daily, vitamins.   SOCIAL HISTORY:  He smokes one pack of cigarettes per day and does not use  alcohol.  He is married and has children.  He works as a Curator at [employer].   FAMILY HISTORY:  Positive for diabetes and hypertension.  Negative for  premature coronary disease or relatives requiring bypass surgery.   REVIEW OF SYSTEMS:  CONSTITUTIONAL:  No systemic symptoms of fever or weight  loss.  ENT:  Review is negative for change in vision, headache, dizziness or  active dental disease.  No difficulty swallowing.  PULMONARY:  Review is  negative for thoracic trauma, productive cough or history of abnormal chest  x-ray.  CARDIAC:  Review is positive  for his angina.  No known prior history  of myocardial infarction or heart disease or cardiac murmur.  ABDOMINAL:  Review is negative for change in bowel habits, blood per rectum, jaundice or  hepatitis.  NEUROLOGICAL:  Review is negative for dysuria, hematuria or  kidney stones.  VASCULAR:  Review is positive for venous insufficiency of  the left lower leg, otherwise negative for claudication or TIA.  HEMATOLOGIC:  Review is negative for bleeding disorder or previous blood  transfusion.  NEUROLOGICAL:  Review is negative for stroke or seizure and he  is right hand dominant.   PHYSICAL EXAMINATION:  VITAL SIGNS:  The patient is 6 feet 2 inches and  weighs 220 pounds.  Blood pressure is 118/60, heart rate is 74 and regular  in sinus.  Oxygen saturation 95%.  GENERAL APPEARANCE:  General appearance is that of an anxious middle aged  white male in the hospital room following cardiac cath in no acute distress.  HEENT:  Exam is normocephalic.  Pharynx is clear.  NECK:  Without JVD, mass or carotid bruit.  LYMPHATICS:  Reveal no palpable supraclavicular or axillary adenopathy.   LUNGS:  Clear.  There is no thoracic deformity.  CARDIAC:  Exam reveals a regular rhythm without S3 gallop or murmur.  ABDOMINAL:  Exam is soft, nontender without mass.  EXTREMITIES:  Reveal no cyanosis, clubbing or edema.  VASCULAR:  Exam reveals 2+ pulses in all extremities.  Allen test is  negative in the right arm.  RECTAL:  Exam is deferred.  SKIN:  Without rash or lesions.  NEUROLOGICAL:  Exam is alert and oriented times three with full motor  function.   LABORATORY DATA:  I reviewed his coronary arteriograms with Dr. Clarene Duke and I  agree with interpretation of severe three vessel disease with occlusion of  the right coronary and occlusion of the OM 1.  His carotid Dopplers show no  significant carotid stenosis and his brachial artery pressures are equal  bilaterally.  Palmar arch studies indicate a patent palmar arch bilaterally.  His chest x-ray is pending.   PLAN:  The patient will benefit from surgical revascularization with  arteriograph to the LAD, circumflex, OM 1, and distal right coronary.  We  will schedule his surgery for March 1 to allow the effects of Plavix to  resolve.  I reviewed the indications and benefits of surgery with the  patient as well as the alternatives and the plan for surgery on March 1st.   Thank you for the consultation.                                               Mikey Bussing, M.D.    PV/MEDQ  D:  02/23/2004  T:  02/23/2004  Job:  [id number]   cc:   CVTS Office   [facility]   [facility]

## 2011-05-16 NOTE — Discharge Summary (Signed)
NAME:  Calvin Francis, Calvin Francis NO.:  [id number]   MEDICAL RECORD NO.:  [id number]                   PATIENT TYPE:  INP   LOCATION:  2016                                 FACILITY:  [facility]   PHYSICIAN:  Kerin Perna, M.D.               DATE OF BIRTH:  [birth year]   DATE OF ADMISSION:  02/22/2004  DATE OF DISCHARGE:  03/03/2004                                 DISCHARGE SUMMARY   REFERRING PHYSICIAN:  Thereasa Solo. Little, M.D.   PRIMARY ADMITTING DIAGNOSIS:  Chest pain.   ADDITIONAL/DISCHARGE DIAGNOSES:  1. Status post acute subendocardial myocardial infarction.  2. Class IV unstable angina.  3. Three vessel coronary artery disease.  4. History of tobacco abuse.  5. History of gastroesophageal reflux disease.  6. History of varicose veins with severe venous insufficiency of the left     leg.  7. Dyslipidemia.   PROCEDURES PERFORMED:  1. Cardiac catheterization.  2. Coronary artery bypass grafting x4 (left internal mammary artery to LAD,     right radial artery to second obtuse marginal, saphenous vein graft to     the first obtuse marginal, saphenous vein graft to the right coronary     artery).  3. Open saphenous vein harvest from right thigh.   HISTORY OF PRESENT ILLNESS:  The patient is a 50 year old white male who  presented to the emergency department on the date of this admission  complaining of substernal chest pain which awakened him from sleep at night.  He has a history of gastroesophageal reflux disease and takes Tums on a  fairly regular basis.  When he began to have chest pain that evening, he  took antacids without any relief.  Upon presentation to the emergency room,  he had an EKG which showed no acute changes.  However, his cardiac enzymes  were mildly positive with CPK-MB of 11.2 and a troponin of 0.3.  He has a  long history of tobacco use and because of his ongoing symptoms and past  medical history, he was admitted for further  evaluation and treatment.   HOSPITAL COURSE:  He was admitted to [facility] and  started on heparin, Integrilin and nitroglycerin.  He is also treated with  two doses of Plavix.  He underwent cardiac catheterization on February 23, 2004, and was found to have severe three vessel coronary artery disease with  an ejection fraction of 40% and proximal 80%stenosis  of the circumflex,  occlusion of the OM1, 75% stenosis of the LAD with diffuse distal disease  and chronic occlusion of the right coronary artery.  He was not felt to be a  good candidate for percutaneous intervention, therefore a cardiothoracic  surgery consultation was obtained.  The patient was seen by Dr. Donata Clay  and he felt that CABG was the best option.  After explanation of the risks,  benefits, and alternatives,  the patient agreed to proceed with surgery.  He  was taken to the operating room on February 27, 2004, and underwent CABG x4 as  described in detail above, performed by Dr. Donata Clay.  He tolerated the  procedure well and was transferred to the SICU in stable condition.  He was  able to be extubated shortly after surgery.   He was hemodynamically stable and doing well on postoperative day #1.  At  that time, he was started on Imdur for patency of his radial artery graft.  He was also started on Lasix for volume overload.  He was able to be  transferred to the floor later in the day on postoperative day #1.  Overall,  he has done very well postoperatively.  He has been started on lipid  lowering agents as well as beta-blocker and ACE inhibitor.  He has been  quite volume overloaded and has been aggressively diuresed.  At present, he  is down to about nine pounds above his preoperative weight and still has  some mild lower extremity edema on examination, but has been diuresing very  well.  Surgical sites were all healing well.  His right hand has remained  well perfused and has good ulnar pulse.   He has been ambulating in the halls  without difficulty.  He has been weaned from supplemental oxygen and is  maintaining O2 saturations of greater than 90% on room air.  He is  maintaining sinus rhythm and other vital signs have been stable.  He has  remained afebrile throughout his hospitalization.   His most recent labs from March 03, 2004, show potassium of 3.9 which has  been supplemented.  BUN and creatinine of 9 and 1.0, respectively.  Also CBC  from March 01, 2004, showed a hemoglobin of 9.4, hematocrit 27.4, white count  7.7, platelets 176.  His most recent chest x-ray showed mild left lower lobe  atelectasis but otherwise stable.  It was felt that since he has remained  stable and is doing well, he is ready for discharge home at this time.  He  has been seen by cardiology prior to discharge and they wish to start him on  Plavix in addition to aspirin and this has been ordered.   DISCHARGE MEDICATIONS:  1. Enteric coated aspirin 325 mg daily.  2. Imdur 15 mg daily.  3. Lasix 40 mg daily x1 week.  4. K-Dur 20 mEq daily x1 week.  5. Mavik 1 mg daily.  6. Toprol XL 50 mg daily.  7. Tricor 48 mg daily.  8. Zocor 20 mg daily.  9. Plavix 75 mg daily.  10.      Tylox one to two q.4h. p.r.n. for pain.   DISCHARGE INSTRUCTIONS:  1. He is to refrain from driving, heavy lifting or strenuous activity.  He     may continue ambulating daily and using his incentive spirometer.  He is     also encouraged to keep his legs elevated when he is not ambulating.  2. He will continue a low fat, low sodium diet.  3. He is asked to shower daily and clean his incisions with soap and water.   FOLLOW UP:  He will make an appointment to see Dr. Clarene Duke for follow-up in  two weeks. The CVTS office will contact him with appointments for staple  removal and wound recheck with the nurse in one week, and appointment with Dr. Donata Clay in three weeks.  Prior to his  appointment with Dr. Donata Clay, he will  have a chest x-ray at [facility] and will  bring his films to our office for Dr. Donata Clay to review.  In the interim,  if he experiences any problems or has questions, he is asked to contact our  office immediately.      Coral Ceo, P.A.                        Kerin Perna, M.D.    GC/MEDQ  D:  03/03/2004  T:  03/04/2004  Job:  [id number]   cc:   Thereasa Solo. Little, M.D.  [address]  [address]  [place]  [place] [address]  Fax: [phone]   [facility] [place])

## 2011-05-16 NOTE — Cardiovascular Report (Signed)
NAME:  KAYLEM, Calvin Francis NO.:  [id number]   MEDICAL RECORD NO.:  [id number]                   PATIENT TYPE:  OIB   LOCATION:  2899                                 FACILITY:  [facility]   PHYSICIAN:  Thereasa Solo. Little, M.D.              DATE OF BIRTH:  1961-02-18   DATE OF PROCEDURE:  08/07/2004  DATE OF DISCHARGE:  08/07/2004                              CARDIAC CATHETERIZATION   INDICATIONS FOR TEST:  Mr. Spackman is a 50 year old male who had bypass  surgery February 27, 2004.  At that time he had saphenous vein grafts to the RCA  and OM #1, free right radial graft to OM #2, and a left internal mammary  artery to his LAD.   He had had a nuclear study because of recurrent exertional chest discomfort.  This was unremarkable.  He has returned to work and started having pain  identical to what he had prior to his bypass surgery and because of this is  brought in for outpatient cardiac catheterization with graft visualization.   PROCEDURE:  After obtaining informed consent the patient was prepped and  draped in the usual sterile fashion.  Following local anesthetic 1%  Xylocaine the Seldinger technique was employed and a 5-French introducer  sheath was placed into the right femoral artery.  Left and right coronary  arteriography, ventriculography, aortic root injection, and graft  visualization were performed.   COMPLICATIONS:  None.   CONTRAST:  115 mL total.   EQUIPMENT:  5-French Judkins configuration catheters and all grafts  including the internal mammary artery were easily cannulated using the RCA  catheter.   RESULTS:  HEMODYNAMIC MONITORING:  Central aortic pressure was 137/92.  Left ventricular pressure was 138/12  and there was no aortic valve gradient noted at time of pullback.   VENTRICULOGRAPHY:  Ventriculography in the RAO projection using 25 mL/second was performed.  Marked ventricular ectopy occurred.  There appeared to be no wall motion  abnormality and the ejection fraction was approximately 50%.  The end-  diastolic pressure was 19.   Aortic root:  Aortic root injection was performed to verify closure of the  right coronary graft.  The root appeared to be normal in size and there was  no evidence of a right coronary graft being patent.   CORONARY ARTERIOGRAPHY:  On fluoroscopy calcification was seen in the proximal LAD and left main  area.  1. Left main normal.  It bifurcated.  2. LAD had mild irregularities in the proximal LAD and in the first     diagonal.  No high grade stenosis was seen.  The second OM was a     relatively smaller vessel with mild irregularities also.  The LAD itself     was 100% occluded after the second diagonal.  Left to right collaterals     were noted.  3. Circumflex 100% occluded in its mid portion  with only a string of an     ongoing circumflex.  No obtuse marginal vessels were visualized via the     native circumflex.  There was, however, some left to right collaterals     seen supplied by the ongoing circumflex.  4. Right coronary artery 100% occluded in its mid portion with several right     ventricular branches giving rise to some collaterals to the distal RCA.     In addition to the right to right collaterals there were also left to     right collaterals from both the LAD and the circumflex.   GRAFTS:  1. Saphenous vein graft to the RCA:  This graft was 100% occluded at the     aorta.  An aortic root injection failed to show the graft being patent     also.  2. Saphenous vein graft to OM #1:  This graft was patent.  There was a valve     in the distal segment, but no high grade stenosis.  OM #1 was free of     disease.  3. Free right radial graft to OM #2:  The graft was patent.  The OM #2 was     patent with no significant disease seen.  4. Left internal mammary artery to the LAD:  This internal mammary artery     was patent.  It was markedly tortuous and did a significant  Shepherd's     crook in its mid portion.  It inserted into the mid/distal portion of the     LAD and about 1 cm distal to the insertion site was an area of 60%     narrowing in the LAD.   CONCLUSION:  1. Low normal ejection fraction of approximately 50%.  2. Severe native disease with total occlusion of the right coronary artery,     circumflex, and distal left anterior descending.  3. Occlusion of the saphenous vein graft to the right coronary artery;     however, there were left to right and right to right collaterals to the     distal right coronary artery which was very small.  4. Moderate area of narrowing in the left anterior descending distal to the     insertion site of the internal mammary artery.   DISCUSSION:  In view of the occlusion of the RCA, this is clearly a  potential source of his pain.  The vessel is very small and this appears to  be identical to what his catheterization was prior to his bypass surgery.  I  suspect the distal RCA bed was too small to maintain graft patency.  The LAD  lesion is also worrisome.  It is downstream from the insertion site but  because of the marked tortuosity and Shepherd crook formation in the mid  portion of the internal mammary artery it would be very difficult and we  would be jeopardizing the graft to the LAD should we try percutaneous  intervention.   The plan for now is to allow the patient to be discharged to home and follow  up in my office tomorrow.  I will readjust his medical therapy and consider  him for EECP therapy.                                               Thereasa Solo.  Little, M.D.    ABL/MEDQ  D:  08/07/2004  T:  08/08/2004  Job:  [id number]

## 2011-07-18 LAB — TESTOSTERONE: Testosterone: 208.52

## 2011-07-18 LAB — PSA: PSA: 0.5

## 2011-07-18 LAB — COMPREHENSIVE METABOLIC PANEL
ALT: 21 U/L (ref 10–40)
AST: 24 U/L
BUN: 17 mg/dL (ref 4–21)
Sodium: 137 mmol/L (ref 137–147)

## 2011-07-18 LAB — TSH: TSH: 0.95 u[IU]/mL (ref 0.41–5.90)

## 2011-07-24 LAB — TESTOSTERONE: Testosterone, total: 178.55

## 2011-07-24 LAB — TESTOSTERONE, FREE: Testosterone, Free: 2.7

## 2011-08-15 HISTORY — PX: OTHER SURGICAL HISTORY: SHX169

## 2012-04-28 DIAGNOSIS — Z87891 Personal history of nicotine dependence: Secondary | ICD-10-CM

## 2012-04-28 HISTORY — DX: Personal history of nicotine dependence: Z87.891

## 2012-08-11 ENCOUNTER — Encounter: Payer: Self-pay | Admitting: Family Medicine

## 2012-08-11 ENCOUNTER — Ambulatory Visit (INDEPENDENT_AMBULATORY_CARE_PROVIDER_SITE_OTHER): Payer: 59 | Admitting: Family Medicine

## 2012-08-11 VITALS — BP 110/82 | HR 84 | Temp 97.7°F | Ht 74.0 in | Wt 249.0 lb

## 2012-08-11 DIAGNOSIS — E291 Testicular hypofunction: Secondary | ICD-10-CM

## 2012-08-11 DIAGNOSIS — K2270 Barrett's esophagus without dysplasia: Secondary | ICD-10-CM

## 2012-08-11 DIAGNOSIS — E785 Hyperlipidemia, unspecified: Secondary | ICD-10-CM

## 2012-08-11 DIAGNOSIS — E349 Endocrine disorder, unspecified: Secondary | ICD-10-CM

## 2012-08-11 DIAGNOSIS — I10 Essential (primary) hypertension: Secondary | ICD-10-CM

## 2012-08-11 DIAGNOSIS — I8390 Asymptomatic varicose veins of unspecified lower extremity: Secondary | ICD-10-CM

## 2012-08-11 DIAGNOSIS — Z87891 Personal history of nicotine dependence: Secondary | ICD-10-CM

## 2012-08-11 DIAGNOSIS — I25119 Atherosclerotic heart disease of native coronary artery with unspecified angina pectoris: Secondary | ICD-10-CM | POA: Insufficient documentation

## 2012-08-11 DIAGNOSIS — I2510 Atherosclerotic heart disease of native coronary artery without angina pectoris: Secondary | ICD-10-CM

## 2012-08-11 MED ORDER — TESTOSTERONE 50 MG/5GM (1%) TD GEL: 5.0000 g | Freq: Every day | TRANSDERMAL | Status: DC

## 2012-08-11 MED ORDER — ROSUVASTATIN CALCIUM 5 MG PO TABS: 5.0000 mg | ORAL_TABLET | Freq: Every day | ORAL | Status: DC

## 2012-08-11 NOTE — Assessment & Plan Note (Signed)
Prescribed testosterone gel by cards but pt states they want me to take over.  Will request records on dx of hypogonadism.

## 2012-08-11 NOTE — Assessment & Plan Note (Signed)
Stable off cigs.  Encouraged continued abstinence.

## 2012-08-11 NOTE — Patient Instructions (Addendum)
Sign release form for records from Dr. Clarene Duke [facility] cards) and Dr. Nita Sells [facility] clinic) Call Dr. Fredirick Maudlin office to see if you're due for follow up. Good to meet you today, return in 3-4 months for follow up, sooner if needed.

## 2012-08-11 NOTE — Assessment & Plan Note (Signed)
Chronic. Good control on current regimen, continue.

## 2012-08-11 NOTE — Progress Notes (Signed)
Subjective:    Patient ID: Calvin Francis, male    DOB: 1961-01-16, 51 y.o.   MRN: [id number]  HPI CC: new pt to establish  Prior saw Dr. Yehuda Budd then [facility].  Cards is Dr. Clarene Duke ,[facility].  Brings copy of records from home.  No questions/concerns today.  H/o MI x3 - on ranexa 1000mg  daily.  Cardiologist is Dr. Clarene Duke, sees every 3-4 months.  S/p 5v CABG 2005, stent placed 2011.  Pending rpt open heart surgery.  CVTS - Donata Clay.  No upcoming appt with Dr. Clarene Duke.  H/o smoking - off for 3 months, now on patch. H/o recurrent bronchitis.  HTN - mildly elevated, controlled on current meds. No h/o HLD per pt.  States has been on statin in past, didn't like how he felt on them.  Caffeine: 1 cup coffee in am Lives with wife, 2 dogs Grown children, 3 grandchildren Occupation: disability from cardiac status for last year, prior worked for [employer] Activity: walks driveway, limited by chest pain/SOB Diet: some water, fruits/vegetables occasionally  Preventative:  no recent blood work/CPE  Medications and allergies reviewed and updated in chart.  Past histories reviewed and updated if relevant as below. There is no problem list on file for this patient.  Past Medical History  Diagnosis Date  . Testosterone deficiency   . MI (myocardial infarction)     x 3  . GERD (gastroesophageal reflux disease)     on omeprazole  . Barrett's esophagus ~2008    s/p EGD several years ago with   . Varicose veins     L leg s/p surgery  . CAD (coronary atherosclerotic disease)     native and bypass  . Bronchitis, mucopurulent recurrent    Past Surgical History  Procedure Date  . Coronary artery bypass graft 02/27/04    5v  . Coronary angioplasty with stent placement 2011    s/p autologous vessel blockage  . Vein surgery 2011    solomon - h/o vericose veins   History  Substance Use Topics  . Smoking status: Former Smoker    Quit date: 04/28/2012  . Smokeless tobacco: Never Used    Comment: long time smoker  . Alcohol Use: Yes     occasionally   Family History  Problem Relation Age of Onset  . Diabetes Paternal Aunt   . Coronary artery disease Paternal Uncle   . Stroke Maternal Uncle   . Cancer Neg Hx    Allergies  Allergen Reactions  . Codeine Rash   Current Outpatient Prescriptions on File Prior to Visit  Medication Sig Dispense Refill  . lisinopril (PRINIVIL,ZESTRIL) 2.5 MG tablet Take 2.5 mg by mouth daily.      . metoprolol tartrate (LOPRESSOR) 25 MG tablet Take 25 mg by mouth 2 (two) times daily.      Marland Kitchen omeprazole (PRILOSEC OTC) 20 MG tablet Take 20 mg by mouth daily.      . ranolazine (RANEXA) 1000 MG SR tablet Take 1,000 mg by mouth daily.      . rosuvastatin (CRESTOR) 5 MG tablet Take 1 tablet (5 mg total) by mouth daily.  30 tablet  3    Review of Systems  Constitutional: Negative for fever, chills, activity change, appetite change, fatigue and unexpected weight change.  HENT: Negative for hearing loss and neck pain.   Eyes: Negative for visual disturbance.  Respiratory: Positive for shortness of breath (see HPI). Negative for cough, chest tightness and wheezing.   Cardiovascular: Positive for  chest pain (see HPI). Negative for palpitations and leg swelling.  Gastrointestinal: Negative for nausea, vomiting, abdominal pain, diarrhea, constipation, blood in stool and abdominal distention.  Genitourinary: Negative for hematuria and difficulty urinating.  Musculoskeletal: Negative for myalgias and arthralgias.  Skin: Negative for rash.  Neurological: Positive for headaches. Negative for dizziness, seizures and syncope.  Hematological: Does not bruise/bleed easily.  Psychiatric/Behavioral: Negative for dysphoric mood. The patient is not nervous/anxious.        Objective:   Physical Exam  Nursing note and vitals reviewed. Constitutional: He is oriented to person, place, and time. He appears well-developed and well-nourished. No distress.    HENT:  Head: Normocephalic and atraumatic.  Right Ear: Hearing, tympanic membrane, external ear and ear canal normal.  Left Ear: Hearing, tympanic membrane, external ear and ear canal normal.  Nose: Nose normal.  Mouth/Throat: Oropharynx is clear and moist. No oropharyngeal exudate.  Eyes: Conjunctivae and EOM are normal. Pupils are equal, round, and reactive to light. No scleral icterus.  Neck: Normal range of motion. Neck supple. Carotid bruit is not present.  Cardiovascular: Normal rate, regular rhythm, normal heart sounds and intact distal pulses.   No murmur heard. Pulses:      Radial pulses are 2+ on the right side, and 2+ on the left side.  Pulmonary/Chest: Effort normal and breath sounds normal. No respiratory distress. He has no wheezes. He has no rales.  Abdominal: Soft. Bowel sounds are normal. He exhibits no distension and no mass. There is no tenderness. There is no rebound and no guarding.  Musculoskeletal: Normal range of motion. He exhibits no edema.  Lymphadenopathy:    He has no cervical adenopathy.  Neurological: He is alert and oriented to person, place, and time.       CN grossly intact, station and gait intact  Skin: Skin is warm and dry. No rash noted.  Psychiatric: He has a normal mood and affect. His behavior is normal. Judgment and thought content normal.       Assessment & Plan:

## 2012-08-11 NOTE — Assessment & Plan Note (Signed)
Advised pt to restart crestor.  Unsure why he's off this medicine. Start at low dose to ensure tolerated (states h/o mylagia with statins in past).

## 2012-08-11 NOTE — Assessment & Plan Note (Signed)
No records in our system of latest EGD although pt states done by [employer]. Continue PPI daily.  May ned future EGD to monitor h/o barretts

## 2012-08-11 NOTE — Assessment & Plan Note (Signed)
Chronic.  continud chest pain, on daily ranexa 1000mg  as well as effient and ASA. Has discussed possible rpt CABG with cards. Has regular f/u with cards (Dr. Clarene Duke, [facility]).  Will request updated records today.

## 2012-08-30 ENCOUNTER — Encounter: Payer: Self-pay | Admitting: Family Medicine

## 2012-09-06 ENCOUNTER — Encounter: Payer: Self-pay | Admitting: Family Medicine

## 2012-10-29 HISTORY — PX: TRANSTHORACIC ECHOCARDIOGRAM: SHX275

## 2012-11-02 HISTORY — PX: OTHER SURGICAL HISTORY: SHX169

## 2012-11-03 ENCOUNTER — Encounter: Payer: Self-pay | Admitting: Family Medicine

## 2012-11-22 ENCOUNTER — Encounter: Payer: Self-pay | Admitting: Family Medicine

## 2012-11-28 DIAGNOSIS — I208 Other forms of angina pectoris: Secondary | ICD-10-CM

## 2012-11-28 DIAGNOSIS — I8390 Asymptomatic varicose veins of unspecified lower extremity: Secondary | ICD-10-CM

## 2012-11-28 DIAGNOSIS — I2089 Other forms of angina pectoris: Secondary | ICD-10-CM

## 2012-11-28 HISTORY — DX: Other forms of angina pectoris: I20.8

## 2012-11-28 HISTORY — DX: Other forms of angina pectoris: I20.89

## 2012-11-28 HISTORY — DX: Asymptomatic varicose veins of unspecified lower extremity: I83.90

## 2012-11-30 ENCOUNTER — Other Ambulatory Visit (HOSPITAL_COMMUNITY): Payer: Self-pay | Admitting: Cardiology

## 2012-11-30 DIAGNOSIS — R079 Chest pain, unspecified: Secondary | ICD-10-CM

## 2012-11-30 DIAGNOSIS — I2510 Atherosclerotic heart disease of native coronary artery without angina pectoris: Secondary | ICD-10-CM

## 2012-11-30 DIAGNOSIS — R9439 Abnormal result of other cardiovascular function study: Secondary | ICD-10-CM

## 2012-12-13 ENCOUNTER — Ambulatory Visit: Payer: 59 | Admitting: Family Medicine

## 2012-12-15 ENCOUNTER — Ambulatory Visit (HOSPITAL_COMMUNITY)
Admission: RE | Admit: 2012-12-15 | Discharge: 2012-12-15 | Disposition: A | Discharge status: Home / Self Care | Payer: 59 | Source: Ambulatory Visit | Attending: Cardiology | Admitting: Cardiology

## 2012-12-15 DIAGNOSIS — R079 Chest pain, unspecified: Secondary | ICD-10-CM | POA: Insufficient documentation

## 2012-12-15 DIAGNOSIS — R9439 Abnormal result of other cardiovascular function study: Secondary | ICD-10-CM | POA: Insufficient documentation

## 2012-12-15 DIAGNOSIS — I2510 Atherosclerotic heart disease of native coronary artery without angina pectoris: Secondary | ICD-10-CM

## 2012-12-15 DIAGNOSIS — I10 Essential (primary) hypertension: Secondary | ICD-10-CM | POA: Insufficient documentation

## 2012-12-15 DIAGNOSIS — Z87891 Personal history of nicotine dependence: Secondary | ICD-10-CM | POA: Insufficient documentation

## 2012-12-15 DIAGNOSIS — E785 Hyperlipidemia, unspecified: Secondary | ICD-10-CM | POA: Insufficient documentation

## 2012-12-15 HISTORY — PX: NM MYOCAR PERF WALL MOTION: HXRAD629

## 2012-12-15 MED ORDER — REGADENOSON 0.4 MG/5ML IV SOLN
0.4000 mg | Freq: Once | INTRAVENOUS | Status: AC
  Administered 2012-12-15: 0.4 mg via INTRAVENOUS

## 2012-12-15 MED ORDER — TECHNETIUM TC 99M SESTAMIBI GENERIC - CARDIOLITE
10.1000 | Freq: Once | INTRAVENOUS | Status: AC | PRN
  Administered 2012-12-15: 10.1 via INTRAVENOUS

## 2012-12-15 MED ORDER — TECHNETIUM TC 99M SESTAMIBI GENERIC - CARDIOLITE
31.0000 | Freq: Once | INTRAVENOUS | Status: AC | PRN
  Administered 2012-12-15: 31 via INTRAVENOUS

## 2012-12-15 NOTE — Procedures (Addendum)
[facility] [address] [place] [address] [phone]  Cardiology Nuclear Med Study  Calvin Francis is a 51 y.o. male     MRN : [id number]     DOB: March 02, 1961  Procedure Date: 12/15/2012  Nuclear Med Background Indication for Stress Test:  Evaluation for Ischemia, Graft Patency and Stent Patency History:  CAD Cardiac Risk Factors: History of Smoking, Hypertension, Lipids and Overweight  Symptoms:  Chest Pain, Dizziness, DOE and SOB   Nuclear Pre-Procedure Caffeine/Decaff Intake:  9:00pm NPO After: 7:00am   IV Site: R Antecubital  IV 0.9% NS with Angio Cath:  22g  Chest Size (in):  46 IV Started by: Koren Shiver, CNMT  Height: 6\' 2"  (1.88 m)  Cup Size: n/a  BMI:  Body mass index is 32.35 kg/(m^2). Weight:  252 lb (114.306 kg)   Tech Comments:  n/a    Nuclear Med Study 1 or 2 day study: 1 day  Stress Test Type:  Lexiscan  Order Authorizing Provider:  Bryan Lemma, MD   Resting Radionuclide: Technetium 59m Sestamibi  Resting Radionuclide Dose: 10.1 mCi   Stress Radionuclide:  Technetium 8m Sestamibi  Stress Radionuclide Dose: 31.0 mCi           Stress Protocol Rest HR: 71 Stress HR: 86  Rest BP: 134/92 Stress BP: 128/93  Exercise Time (min): n/a METS: n/a          Dose of Adenosine (mg):  n/a Dose of Lexiscan: 0.4 mg  Dose of Atropine (mg): n/a Dose of Dobutamine: n/a mcg/kg/min (at max HR)  Stress Test Technologist: Ernestene Mention, CCT Nuclear Technologist: Koren Shiver, CNMT   Rest Procedure:  Myocardial perfusion imaging was performed at rest 45 minutes following the intravenous administration of Technetium 66m Sestamibi. Stress Procedure:  The patient received IV Lexiscan 0.4 mg over 15-seconds.  Technetium 86m Sestamibi injected at 30-seconds.  There were no significant changes with Lexiscan.  Quantitative spect images were obtained after a 45 minute delay.  Transient Ischemic Dilatation (Normal  <1.22):  1.07 Lung/Heart Ratio (Normal <0.45):  0.33 QGS EDV:  206 ml QGS ESV:  145 ml LV Ejection Fraction: 30%  Signed by   Rest ECG: Normal sinus rhythm  Stress ECG: No significant change from baseline ECG  QPS Raw Data Images:  Mild subdiaphragmatic attenuation with dilated LV.  ESV 145 Ml and EDV 206 ml. Stress Images:  Large fixed lateral wall defect which is scar. There is a paritally reversible anterolateral defect. Rest Images:  Fixed lateral wall defect. Mildly reduced anteroapical defect at rest. Subtraction (SDS):  5   Impression Exercise Capacity:  Lexiscan with no exercise. BP Response:  Normal blood pressure response. Clinical Symptoms:  There is dyspnea. ECG Impression:  No significant ECG changes with Lexiscan. Comparison with Prior Nuclear Study: Compared to a prior study in 2007, there is a new lateral scar and signs of anterior ischemia.  Overall Impression:  High risk stress nuclear study.  There is a large lateral wall scar.  There is partial, but significant reversibility of the anterior wall suggesting ischemia.   LV Wall Motion:  Severe global hypokinesis, worse of the mid to distal anterior wall and akinesis of the lateral wall. Inferior and septal walls seem to be preserved. LVEF is 30%.  Chrystie Nose, MD, [facility] Attending Cardiologist [facility]  Chrystie Nose, MD  12/15/2012 1:14 PM

## 2012-12-21 ENCOUNTER — Ambulatory Visit
Admission: RE | Admit: 2012-12-21 | Discharge: 2012-12-21 | Disposition: A | Discharge status: Home / Self Care | Payer: 59 | Source: Ambulatory Visit | Attending: Cardiology | Admitting: Cardiology

## 2012-12-21 ENCOUNTER — Other Ambulatory Visit: Payer: Self-pay | Admitting: Cardiology

## 2012-12-21 ENCOUNTER — Encounter: Payer: Self-pay | Admitting: Family Medicine

## 2012-12-21 DIAGNOSIS — Z01818 Encounter for other preprocedural examination: Secondary | ICD-10-CM

## 2012-12-23 ENCOUNTER — Encounter (HOSPITAL_COMMUNITY): Payer: Self-pay | Admitting: Pharmacy Technician

## 2012-12-27 ENCOUNTER — Encounter (HOSPITAL_COMMUNITY)
Admission: RE | Disposition: A | Discharge status: Home / Self Care | Payer: Self-pay | Source: Ambulatory Visit | Attending: Cardiology

## 2012-12-27 ENCOUNTER — Encounter (HOSPITAL_COMMUNITY): Payer: Self-pay | Admitting: Cardiology

## 2012-12-27 ENCOUNTER — Ambulatory Visit (HOSPITAL_COMMUNITY)
Admission: RE | Admit: 2012-12-27 | Discharge: 2012-12-27 | Disposition: A | Discharge status: Home / Self Care | Payer: 59 | Source: Ambulatory Visit | Attending: Cardiology | Admitting: Cardiology

## 2012-12-27 DIAGNOSIS — E785 Hyperlipidemia, unspecified: Secondary | ICD-10-CM | POA: Diagnosis present

## 2012-12-27 DIAGNOSIS — I10 Essential (primary) hypertension: Secondary | ICD-10-CM | POA: Diagnosis present

## 2012-12-27 DIAGNOSIS — I25810 Atherosclerosis of coronary artery bypass graft(s) without angina pectoris: Secondary | ICD-10-CM | POA: Insufficient documentation

## 2012-12-27 DIAGNOSIS — I25119 Atherosclerotic heart disease of native coronary artery with unspecified angina pectoris: Secondary | ICD-10-CM | POA: Diagnosis present

## 2012-12-27 DIAGNOSIS — Z87891 Personal history of nicotine dependence: Secondary | ICD-10-CM | POA: Insufficient documentation

## 2012-12-27 DIAGNOSIS — I2589 Other forms of chronic ischemic heart disease: Secondary | ICD-10-CM | POA: Insufficient documentation

## 2012-12-27 DIAGNOSIS — I2510 Atherosclerotic heart disease of native coronary artery without angina pectoris: Secondary | ICD-10-CM | POA: Insufficient documentation

## 2012-12-27 DIAGNOSIS — I2089 Other forms of angina pectoris: Secondary | ICD-10-CM | POA: Diagnosis present

## 2012-12-27 DIAGNOSIS — I209 Angina pectoris, unspecified: Secondary | ICD-10-CM | POA: Insufficient documentation

## 2012-12-27 DIAGNOSIS — I208 Other forms of angina pectoris: Secondary | ICD-10-CM | POA: Diagnosis present

## 2012-12-27 DIAGNOSIS — R9439 Abnormal result of other cardiovascular function study: Secondary | ICD-10-CM | POA: Diagnosis present

## 2012-12-27 DIAGNOSIS — K219 Gastro-esophageal reflux disease without esophagitis: Secondary | ICD-10-CM | POA: Insufficient documentation

## 2012-12-27 HISTORY — DX: Other forms of angina pectoris: I20.8

## 2012-12-27 HISTORY — PX: LEFT HEART CATHETERIZATION WITH CORONARY/GRAFT ANGIOGRAM: SHX5450

## 2012-12-27 HISTORY — DX: Ischemic cardiomyopathy: I25.5

## 2012-12-27 SURGERY — LEFT HEART CATHETERIZATION WITH CORONARY/GRAFT ANGIOGRAM
Anesthesia: LOCAL

## 2012-12-27 MED ORDER — ISOSORBIDE MONONITRATE ER 30 MG PO TB24
30.0000 mg | ORAL_TABLET | Freq: Every day | ORAL | Status: DC
  Filled 2012-12-27: qty 1

## 2012-12-27 MED ORDER — ACETAMINOPHEN 325 MG PO TABS: 650.0000 mg | ORAL_TABLET | ORAL | Status: DC | PRN

## 2012-12-27 MED ORDER — MORPHINE SULFATE 2 MG/ML IJ SOLN: 2.0000 mg | INTRAMUSCULAR | Status: DC | PRN

## 2012-12-27 MED ORDER — SODIUM CHLORIDE 0.9 % IV SOLN
INTRAVENOUS | Status: DC
  Administered 2012-12-27: 09:00:00 via INTRAVENOUS

## 2012-12-27 MED ORDER — ONDANSETRON HCL 4 MG/2ML IJ SOLN: 4.0000 mg | Freq: Four times a day (QID) | INTRAMUSCULAR | Status: DC | PRN

## 2012-12-27 MED ORDER — SODIUM CHLORIDE 0.9 % IV SOLN: 250.0000 mL | INTRAVENOUS | Status: DC | PRN

## 2012-12-27 MED ORDER — NITROGLYCERIN 0.2 MG/ML ON CALL CATH LAB
INTRAVENOUS | Status: AC
  Filled 2012-12-27: qty 1

## 2012-12-27 MED ORDER — DIAZEPAM 5 MG PO TABS
5.0000 mg | ORAL_TABLET | ORAL | Status: AC
  Administered 2012-12-27: 5 mg via ORAL
  Filled 2012-12-27: qty 1

## 2012-12-27 MED ORDER — HEPARIN (PORCINE) IN NACL 2-0.9 UNIT/ML-% IJ SOLN
INTRAMUSCULAR | Status: AC
  Filled 2012-12-27: qty 1000

## 2012-12-27 MED ORDER — MIDAZOLAM HCL 2 MG/2ML IJ SOLN
INTRAMUSCULAR | Status: AC
  Filled 2012-12-27: qty 2

## 2012-12-27 MED ORDER — LIDOCAINE HCL (PF) 1 % IJ SOLN
INTRAMUSCULAR | Status: AC
  Filled 2012-12-27: qty 30

## 2012-12-27 MED ORDER — SODIUM CHLORIDE 0.9 % IJ SOLN: 3.0000 mL | INTRAMUSCULAR | Status: DC | PRN

## 2012-12-27 MED ORDER — VERAPAMIL HCL 2.5 MG/ML IV SOLN
INTRAVENOUS | Status: AC
  Filled 2012-12-27: qty 2

## 2012-12-27 MED ORDER — ISOSORBIDE MONONITRATE ER 30 MG PO TB24: 30.0000 mg | ORAL_TABLET | Freq: Every day | ORAL | Status: DC

## 2012-12-27 MED ORDER — FENTANYL CITRATE 0.05 MG/ML IJ SOLN
INTRAMUSCULAR | Status: AC
  Filled 2012-12-27: qty 2

## 2012-12-27 MED ORDER — SODIUM CHLORIDE 0.9 % IV SOLN: 1.0000 mL/kg/h | INTRAVENOUS | Status: DC

## 2012-12-27 MED ORDER — SODIUM CHLORIDE 0.9 % IJ SOLN: 3.0000 mL | Freq: Two times a day (BID) | INTRAMUSCULAR | Status: DC

## 2012-12-27 NOTE — CV Procedure (Signed)
[facility] A CARDIAC CATHETERIZATION REPORT  NAME: Calvin Francis   MRN: [id number] DOB: 06-Jan-1961   ADMIT DATE:  12/27/2012 Procedure date: 12/27/2012  Performing Cardiologist: Marykay Lex , MD, MS Primary Physician: Eustaquio Boyden, MD Primary Cardiologist:  Marykay Lex, MD, MS  Procedures Performed:  Left Heart Catheterization via 5 Fr Left Radial Artery access  Left Ventriculography, (RAO/LAO) 12 ml/sec for 25 ml total contrast  Native Coronary Angiography  Internal Mammary Graft Angiography  Saphenous Vein Graft / Free Radial Artery Graft Angiography  IC NTG Injection   Indication(s): High Risk Myoview Stress Test  Stable Exertional Angina, Class II  Abnormal, moderate risk MET/CPET functional stress test  Known Coronary Artery Disease s/p Post CABG and PCI  History: 51 y.o. male with an extensive cardiac history noted below, who has been dealing with chronic stable (Class II) angina despite aggressive medical therapy.  He recently underwent a MET/CPET physiologic stress test that was noted to be significantly abnormal. This was followed up by a Lexiscan Myoview that was read as HIGH RISK - with anterior ischemia that is likely explained by the distal LAD disease noted on his most recent cardiac catheterizations. However, LIMA stenosis or an insertion site lesion cannot be excluded without invasive evaluation.  He was seen by me on 12/19 to discuss the results of the stress test. He has agreed to proceed with LHC/Angiography to better assess his anatomy to determine if there are any interventional options.   CAD (coronary artery disease) 2005: ischemic cardiomyopathy EF 40% by Echo  -- Last Cath May 2012 nonrevascularizable, h/o MI x3 s/p CABG and PCI (native and bypass CAD), SVG-RCA & SVG-OM1 100%; patent LIMA -LAD (severe ~70-80% distal LAD disease after LIMA) & Free Radial OM2   Consent: The procedure with  Risks/Benefits/Alternatives and Indications was reviewed with the patient (and wife).  All questions were answered.    Risks / Complications include, but not limited to: Death, MI, CVA/TIA, VF/VT (with defibrillation), Bradycardia (need for temporary pacer placement), contrast induced nephropathy, bleeding / bruising / hematoma / pseudoaneurysm, vascular or coronary injury (with possible emergent CT or Vascular Surgery), adverse medication reactions, infection.    The patient and his wife voice understanding and agree to proceed.    Consent for signed by MD and patient with RN witness -- placed on chart.  Procedure: The patient was brought to the 2nd Floor [facility] in the fasting state and prepped and draped in the usual sterile fashion for Right Groin or Left Radial access. A modified Allen's test with plethysmography was performed on the left wrist demonstrating adequate Ulnar Artery collateral flow.   Sterile technique was used including antiseptics, cap, gloves, gown, hand hygiene, mask and sheet.  Skin prep: Chlorhexidine.  Time Out: Verified patient identification, verified procedure, site/side was marked, verified correct patient position, special equipment/implants available, medications/allergies/relevent history reviewed, required imaging and test results available.  Performed  The left wrist was anesthetized with 1% subcutaneous Lidocaine.  The left radial artery was accessed using the Seldinger Technique with placement of a 5 Fr Glide Sheath. The sheath was aspirated and flushed.  Then a total of 10 ml of standard Radial Artery Cocktail (see medications) was infused.  Radial Cocktail: 5 mg Verapamil, 400 mcg NTG, 2 ml 2% Lidocaine in 10 ml NS  A 5 Fr TIG 4.0 Catheter was advanced of over a Versicore wire into the ascending Aorta.  The catheter was used to  engage the native Left Coronary Artery as well as the SVG-OM1 and Free Radial Graft- OM 2.  Multiple  cineangiographic views of the were performed.   This catheter was then exchanged over the Long Exchange Safety J wire for an angled Pigtail catheter that was advanced across the Aortic Valve.  LV hemodynamics were measured and Left Ventriculography was performed.  LV hemodynamics were then re-sampled, and the catheter was pulled back across the Aortic Valve for measurement of "pull-back" gradient.    The catheter was then exchanged over a Lexicographer J for first a JR 4 followed by an  IMA catheter that was used to engage the Left Internal Mammary-LAD graft. Multiple cineangiographic images were performed.   The catheter and wire  wire  were removed completely out of the body.  The sheath was removed in the Cath Lab with a TR band placed at 12  ml Air at  1200 hours.  Reverse Allen's test revealed non-occlusive hemostasis.  The patient was transported to the  PACU holding area in a  chest pain free, hemodynamically stable condition.   The patient  was stable before, during and following the procedure.   Patient did tolerate procedure well. There were not complications. EBL: <5 mL   Medications:  Premedication:  5 mg  Valium by mouth,   Sedation:  2 mg IV Versed,  50  mcg IV Fentanyl  Contrast:  140 mL  Omnipaque  Radial Cocktail: 5 mg Verapamil, 400 mcg NTG, 2 ml 2% Lidocaine in 10 ml NS  Hemodynamics:  Central Aortic Pressure / Mean Aortic Pressure: 110/77 mmHg; 96 mm Hg   LV Pressure / LV End diastolic Pressure:   114/5 mm Hg; 6 mm Hg   Left Ventriculography: Likely inaccurate, do to catheter related right bundle branch block induced when crossing the valve.  EF:   roughly 35%   Wall Motion: severe mid to distal/apical inferior hypokinesis as well as moderate anterolateral hypokinesis   Coronary Angiographic Data:  Left Main:   large-caliber vessel bifurcates into the circumflex and LAD. Mild distal tapering prior to bifurcation.   Left Anterior Descending (LAD):   Initially a large caliber vessel that tapers with diffuse calcification and disease up to a major septal trunk/first diagonal bifurcation. Beyond this the LAD is occluded 100%. There is slight progression of disease in this proximal portion of the LAD with near total occlusion prior to the septal trunk and diagonal takeoff.    1st diagonal (D1):  Small-caliber vessel with ostial 70-80% stenosis and diffuse luminal irregularities distally. The vessel is a roughly 1.5 mm vessel   Circumflex (LCx):  Large-caliber vessel with proximal 60% stenosis. This is followed by the takeoff of a OM1 with ostial 90% stenosis with mid occlusion. The AV Groove Circumflex then has a severe 90% stenosis in the AV groove itself before and after the stump of OM 2. The follow on vessel provides collaterals ages to the Right Posterior Lateral System that actually provides retrograde filling of the Right PDA   1st obtuse marginal:  Small to moderate caliber vessel with ostial 90% stenosis followed by a mid-vessel occlusion   2nd obtuse marginal:  Fills via widely patent Free Radial Artery Graft    Left to Right Collaterals to the Right Posterior Lateral Branch:  Small in caliber but robust. Somewhat improved from last catheterization  Right Coronary Artery: Known 100% occlusion, not visualized  LIMA-LAD: Widely patent with a tortuous course in the distal portion  of the graft. The downstream LAD has several lesions ranging from 60-80% in the mid to distal vessel as it wraps around the apex. There is minimal retrograde flow up the native vessel. No appreciable change noted from last heart catheterization. The distal vessel is small likely less than 2 mm diameter and due to the tortuous LIMA and small-caliber vessel, not likely a viable PCI target.  Free Radial Graft  to OM2: Widely patent with minimal retrograde flow to the native circumflex   SVG-OM1: 100 and occluded proximally at the previously placed stent.  Known  occlusion of SVG to RCA not a visualized  Impression:  Essentially stable coronary artery disease with progression of native disease in the proximal LAD but improved left to right collateralization from the left circumflex system.  Anterior ischemia can easily explained by the existence of significant lesions in the downstream LAD, that were previously noted and are not amenable to PCI.  Borderline significantly reduced LVEF with normal EDP.  Plan:  Discharge home today after post radial cath care.  Continue to optimize medical therapy for stable exertional angina. We'll add each bedtime Imdur 30 mg to his Ranexa. Unfortunately his beta blocker intolerant, therefore will not increase his ACE inhibitor dose as well as an outpatient.  I will see him as scheduled and followup.   The case and results was discussed with the patient (and family).  Time Spend Directly with Patient:  45 minutes  HARDING,Veto W, M.D., M.S. [facility] [address] [place], [place]  [address]  [phone]  12/27/2012 12:42 PM

## 2012-12-27 NOTE — H&P (Signed)
History and Physical Interval Note:  NAME:  Calvin Francis   MRN: [id number] DOB:  1961-12-07   ADMIT DATE: (Not on file)   12/27/2012 8:39 AM  Calvin Francis is a 51 y.o. male with an extensive cardiac history noted below who has been dealing with chronic stable (Class II) angina despite aggressive medical therapy. He recently underwent a MET/CPET physiologic stress test that was noted to be significantly abnormal.  This was followed up by a Lexiscan Myoview that was read as HIGH RISK - with anterior ischemia that is likely explained by the distal LAD disease noted on his most recent cardiac catheterizations.  However, LIMA stenosis or an insertion site lesion cannot be excluded without invasive evaluation.   He was seen by me on 12/19 to discuss the results of the stress test.  He has agreed to proceed with LHC/Angiography to better assess his anatomy to determine if there are any interventional options.  Past Medical History  Diagnosis Date  . CAD (coronary artery disease) 2005    severe; now nonrevascularizable, h/o MI x3 s/p CABG and PCI (native and bypass CAD), SVG-RCA & SVG-OM1 100%; patent LIMA -LAD (severe ~70-80% distal LAD disease after LIMA) & Free Radial OM2   . GERD (gastroesophageal reflux disease)     ?barrett's, with esophageal dysmotility, on omeprazole  . Barrett's esophagus ~2008    s/p EGD several years ago with [facility]  . Varicose veins     L leg s/p surgery  . Bronchitis, mucopurulent recurrent   . HTN (hypertension)   . HLD (hyperlipidemia)   . Smoking history quit 04/2012  . ED (erectile dysfunction)   . Prediabetes 2009  . Gout   . Testosterone deficiency   . Stable angina     Chronic  . Ischemic cardiomyopathy Echo 10/2012    EF ~40%; moderate Posterior HypoKinesis, minld-moderate inferior Hypokinesis; mild RV dilation, mild concentric LVH   Past Surgical History  Procedure Date  . Coronary artery bypass graft 02/27/04    LIMA -LAD, Free Radial-OM2 --  patent; SVG-OM1, SVG-RCA, - 100% occluded by recent cath  . Coronary angioplasty with stent placement 2011    s/p autologous vessel blockage  . Vein surgery 2011    [facility] - h/o vericose veins  . Esophagogastroduodenoscopy   . US echocardiography 09/2012    mild LVH, EF 40%, mod posterior and inf wall hypokinesis    FAMHx: Family History  Problem Relation Age of Onset  . Diabetes Paternal Aunt   . Coronary artery disease Paternal Uncle   . Stroke Maternal Uncle   . Cancer Neg Hx     SOCHx:  reports that he quit smoking about 7 months ago. He has never used smokeless tobacco. He reports that he drinks alcohol. He reports that he does not use illicit drugs.  ALLERGIES: Allergies  Allergen Reactions  . Codeine Rash    HOME MEDICATIONS: No prescriptions prior to admission    IMPRESSION & PLAN The patients' history has been reviewed, patient examined, no change in status from most recent note, stable for surgery. I have reviewed the patients' chart and labs. Questions were answered to the patient's satisfaction.    Calvin Francis has presented today for surgery, with the diagnosis of Class II Exertional Angina with abnormal MET Test and High Risk Myoview Stress Test. The various methods of treatment have been discussed with the patient and family.   Risks / Complications include, but not limited to: Death, MI, CVA/TIA,  VF/VT (with defibrillation), Bradycardia (need for temporary pacer placement), contrast induced nephropathy, bleeding / bruising / hematoma / pseudoaneurysm, vascular or coronary injury (with possible emergent CT or Vascular Surgery), adverse medication reactions, infection.     After consideration of risks, benefits and other options for treatment, the patient has consented to Procedure(s):  LEFT HEART CATHETERIZATION AND CORONARY/GRAFT ANGIOGRAPHY +/- AD HOC PERCUTANEOUS CORONARY INTERVENTION  as a surgical intervention.   We will proceed with the planned  procedure.   HARDING,Declyn W [facility] [address] [place], [place]  [address]  [phone]  12/27/2012 8:39 AM

## 2013-04-25 ENCOUNTER — Encounter: Payer: Self-pay | Admitting: Cardiology

## 2013-05-19 ENCOUNTER — Other Ambulatory Visit: Payer: Self-pay | Admitting: *Deleted

## 2013-05-19 MED ORDER — LISINOPRIL 10 MG PO TABS: 10.0000 mg | ORAL_TABLET | Freq: Every day | ORAL | Status: DC

## 2013-05-19 NOTE — Telephone Encounter (Signed)
Refill for lisinopril 

## 2013-06-09 ENCOUNTER — Telehealth: Payer: Self-pay | Admitting: Obstetrics and Gynecology

## 2013-06-09 MED ORDER — LISINOPRIL 2.5 MG PO TABS: 2.5000 mg | ORAL_TABLET | Freq: Every day | ORAL | Status: DC

## 2013-06-09 MED ORDER — PRASUGREL HCL 10 MG PO TABS: 10.0000 mg | ORAL_TABLET | Freq: Every day | ORAL | Status: DC

## 2013-06-09 MED ORDER — RANOLAZINE ER 1000 MG PO TB12: 1000.0000 mg | ORAL_TABLET | Freq: Every day | ORAL | Status: DC

## 2013-06-09 NOTE — Telephone Encounter (Signed)
Returned call.  Left message that no generics for meds and to call back before 4pm.

## 2013-06-09 NOTE — Telephone Encounter (Signed)
OK will look when next I am in clinic.

## 2013-06-09 NOTE — Telephone Encounter (Signed)
New Rx requests received from [facility] and electronic Rxs sent to pharmacy. (Lisinopril, Ranexa, Effient)

## 2013-06-09 NOTE — Telephone Encounter (Signed)
Pt called back and informed Rxs sent and that there are no generics for Effient and Ranexa.  Pt verbalized understanding.  Pt would like to know if there is something else he can be switched to that is not as expensive.  Stated he switched insurance and has to use [facility] now and they are about $100 more than [facility].  Pt informed Dr. Herbie Baltimore will be notified.  Pt verbalized understanding and agreed w/ plan.  Pt has enough meds and stated he likes them.  Doesn't want to switch unless he has to.  Message forwarded to Dr. Herbie Baltimore.

## 2013-06-09 NOTE — Telephone Encounter (Signed)
He wanted you to know he have changed mail order company-he changed to [facility]-Also wants to know if there is a generic for Ranexa and Effient? I hit the wrong doctor number-the doctor should be Dr Bryan Lemma!

## 2013-06-09 NOTE — Telephone Encounter (Signed)
Returned call and informed pt per instructions by MD/PA.  Pt verbalized understanding and agreed w/ plan.  Pt stated he thinks he may have taken Plavix before.  Reviewed paper chart and pt was on Plavix from March 2005 to August 2005.  Pt stated he can't remember why it was stopped.  Pt would like Dr. Herbie Baltimore to let him know if it's okay to take.  If so, pt would like Rx sent to [facility] for #30 for a trial.  Pt informed Dr. Herbie Baltimore will be notified and his paper chart will be placed on his cart for review when he returns to clinic next week.  Pt verbalized understanding and agreed w/ plan.  Paper chart# [id number] w/ this note on Dr. Elissa Hefty cart.

## 2013-06-09 NOTE — Telephone Encounter (Signed)
We can change from Effient to generic Plavix (clopidogrel) - which would be somewhat cost saving.  He is far enough out from last PCI for this.   Unfortunately, no alternative for Ranexa.  If he wants, we can do Clopidorel 75 mg & do [facility] online 1 yr Rx (~$60-100 for 365 tablets).  Marykay Lex, MD

## 2013-06-09 NOTE — Telephone Encounter (Signed)
Wanted you to know he have changed mail order company-He now will be using [facility]! On his Ranexa and his Effient are there generic for them?

## 2013-06-10 ENCOUNTER — Telehealth: Payer: Self-pay | Admitting: *Deleted

## 2013-06-10 NOTE — Telephone Encounter (Signed)
Returned call.  Pt stated he talked with his wife about the Plavix last night and they can't seem to remember why he was taken off of it.  Stated he was thinking about doing a trial on the Plavix before it was sent to mail order pharmacy.  Pt informed Dr. Herbie Baltimore authorized the Rx and that can be done or he can wait until Dr. Herbie Baltimore reviews his chart.  Pt informed Dr. Herbie Baltimore is aware of his concerns and will look at his chart next week.  Pt decided to wait until Dr. Herbie Baltimore reviews chart.

## 2013-06-10 NOTE — Telephone Encounter (Signed)
States that he wants to speak with [facility]

## 2013-06-14 ENCOUNTER — Telehealth: Payer: Self-pay | Admitting: *Deleted

## 2013-06-14 DIAGNOSIS — Z79899 Other long term (current) drug therapy: Secondary | ICD-10-CM

## 2013-06-14 MED ORDER — CLOPIDOGREL BISULFATE 75 MG PO TABS: 75.0000 mg | ORAL_TABLET | Freq: Every day | ORAL | Status: DC

## 2013-06-14 NOTE — Telephone Encounter (Signed)
Returned call and informed pt per instructions by MD/PA.  Pt verbalized understanding and agreed w/ plan.  Rx sent to pharmacy and lab ordered.  Pt informed he will need to go to the lab at [facility] to process this test.  Pt verbalized understanding and agreed w/ plan.  Pt stated he will start Plavix tomorrow.

## 2013-06-14 NOTE — Telephone Encounter (Signed)
Returning your call from this morning. °

## 2013-06-14 NOTE — Telephone Encounter (Signed)
Returned call.  Left message to call back before 4pm.  

## 2013-06-14 NOTE — Telephone Encounter (Signed)
Message copied by Chauncey Reading on Tue Jun 14, 2013  8:43 AM ------      Message from: [facility], Kapil      Created: Tue Jun 14, 2013  8:16 AM       I have looked through Mr. Hyudson's chart -- I cannot find any documentation on why we changed to Effient from Plavix.              He is far enough out from his stent that we can switch to Plavix.            The plan was to do a one month trial of Plavix & check a P2Y12 Inhibition assay after the 2nd week on Plavix -- if acceptable, we could then go to the yearly Rx.            Marykay Lex, MD       ------

## 2013-06-15 ENCOUNTER — Telehealth: Payer: Self-pay | Admitting: Cardiology

## 2013-06-15 NOTE — Telephone Encounter (Signed)
Returned call.  Pt concerned about change in milligrams.  Pt informed they are two different medications and come in different strengths.  Pt informed this is the dose Dr. Herbie Baltimore wants him to take and he will check labs in 2 wks to make sure it is working properly.  Pt verbalized understanding and agreed w/ plan.

## 2013-06-15 NOTE — Telephone Encounter (Signed)
Picked up his Plavix yesterday-concerned about the milligram-he was taking 10mg  of Effient-he thinks this is a big different!

## 2013-06-27 ENCOUNTER — Telehealth: Payer: Self-pay | Admitting: Cardiology

## 2013-06-27 NOTE — Telephone Encounter (Signed)
Upon review of patients chart he is suppose to be taking 2.5mg lisinopril QD not 10mg.  Had pt take BP while we were on the phone because he was telling me he was getting reading like 150/130, 140/120.  Reading today while on the phone was 143/99.  Reviewed w/kristin and we will make him an appt with her to go over all meds and check his BP. 

## 2013-06-27 NOTE — Telephone Encounter (Signed)
Upon review of patients chart he is suppose to be taking 2.5mg  lisinopril QD not 10mg .  Had pt take BP while we were on the phone because he was telling me he was getting reading like 150/130, 140/120.  Reading today while on the phone was 143/99.  Reviewed w/kristin and we will make him an appt with her to go over all meds and check his BP.

## 2013-06-27 NOTE — Telephone Encounter (Signed)
Switched from effient to clopidogrel 1 week ago.  After one day started feeling disoriented and slight blurred vision after taking for 6-8 hours. Started back on effient Saturday because he was feeling so bad. Has felt a little better.  Also found out today he has actually been taking 5mg  lisinopril QD, suppose to be taking 10mg  QD. Will review w/ kristin re: effient/plavix situation.

## 2013-06-27 NOTE — Telephone Encounter (Signed)
Needs appt w/kristin for BP/med review- 20 minute slot

## 2013-06-27 NOTE — Telephone Encounter (Signed)
Please call question-question about the generic Plavix!

## 2013-06-28 ENCOUNTER — Ambulatory Visit (INDEPENDENT_AMBULATORY_CARE_PROVIDER_SITE_OTHER): Payer: Medicare HMO | Admitting: Pharmacist Clinician (PhC)/ Clinical Pharmacy Specialist

## 2013-06-28 ENCOUNTER — Encounter: Payer: Self-pay | Admitting: Pharmacist Clinician (PhC)/ Clinical Pharmacy Specialist

## 2013-06-28 VITALS — BP 128/84

## 2013-06-28 DIAGNOSIS — I10 Essential (primary) hypertension: Secondary | ICD-10-CM

## 2013-06-28 NOTE — Patient Instructions (Addendum)
Continue to take your BP at home 3-4 times per week.  Keep taking 1/2 of the 10mg  lisinopril until they are gone, then be sure to re-order the 2.5mg  tabs.    Follow up with Dr. Herbie Baltimore in 2-3 months.

## 2013-06-28 NOTE — Progress Notes (Signed)
I agree with this plan.  Cant close encounter without diagnosis.  Marykay Lex, MD

## 2013-06-28 NOTE — Progress Notes (Signed)
HPI:  Calvin Francis is a 52 y.o. male with a PMH below who presents today for a blood pressure check and medication review.  He has been calling office frequently and seems a bit confused as to his meds.  He has been taking both Ranexa 1gm bid and Effient 10mg  qd, but was concerned over high cost, and asked for alternatives to Effient (he likes Ranexa and did not want anything else in its place).  He was switched to clopidogrel 75mg  but states that after a week he was having headaches, lightheadedness and vision problems that he associated with each dose of clopidogrel.  After 8 doses he switched back to his Effient and has been feeling better, although has noted that his blood pressure was as high as 150/98.  He was asked to come in and see me today to get everything straightened out.  He has been on lisinopril 2.5mg  for some time, without problem, however he realized about 2 months ago that his mail order pharmacy sent him 10mg  tabs (from a previous prescription).  He has been breaking those in half since he realized this.     Current Outpatient Prescriptions  Medication Sig Dispense Refill  . aspirin 81 MG chewable tablet Chew 81 mg by mouth daily.      Calvin Francis Oil Omega-3 300 MG CAPS Take 300 mg by mouth daily.      Calvin Francis Kitchen lisinopril (PRINIVIL,ZESTRIL) 2.5 MG tablet Take 1 tablet (2.5 mg total) by mouth daily.  90 tablet  3  . omeprazole (PRILOSEC OTC) 20 MG tablet Take 20 mg by mouth daily.      . prasugrel (EFFIENT) 10 MG TABS Take 1 tablet (10 mg total) by mouth daily.  90 tablet  3  . ranolazine (RANEXA) 1000 MG SR tablet Take 1 tablet (1,000 mg total) by mouth daily. In the morning  90 tablet  3  . testosterone (ANDROGEL) 50 MG/5GM GEL Place 5 g onto the skin daily. Compounded 10mL syringe x 3 for 1 mo supply, 1 cc daily.  150 g  5  . clopidogrel (PLAVIX) 75 MG tablet Take 1 tablet (75 mg total) by mouth daily.  30 tablet  1  . isosorbide mononitrate (IMDUR) 30 MG 24 hr tablet Take 1 tablet (30  mg total) by mouth daily. Take qhs  30 min after ASA  30 tablet  6  . NICOTINE TD Place 1 patch onto the skin daily.       No current facility-administered medications for this visit.    Allergies  Allergen Reactions  . Codeine Rash    Past Medical History  Diagnosis Date  . CAD (coronary artery disease) 2005    severe; now nonrevascularizable, h/o MI x3 s/p CABG and PCI (native and bypass CAD), SVG-RCA & SVG-OM1 100%; patent LIMA -LAD (severe ~70-80% distal LAD disease after LIMA) & Free Radial OM2   . GERD (gastroesophageal reflux disease)     ?barrett's, with esophageal dysmotility, on omeprazole  . Barrett's esophagus ~2008    s/p EGD several years ago with [facility]  . Varicose veins     L leg s/p surgery  . Bronchitis, mucopurulent recurrent   . HTN (hypertension)   . HLD (hyperlipidemia)   . Smoking history quit 04/2012  . ED (erectile dysfunction)   . Prediabetes 2009  . Gout   . Testosterone deficiency   . Stable angina     Chronic  . Ischemic cardiomyopathy Echo 10/2012    EF ~  40%; moderate Posterior HypoKinesis, minld-moderate inferior Hypokinesis; mild RV dilation, mild concentric LVH    BP today sitting: 118/78 BP today standing: 128/84  ASSESSMENT AND PLAN:  [name]'s medication list was reviewed and updated today.  His blood pressure is excellent at 118/78 with a slight rise to 128/84 when standing.  His home BP cuff read 124/72.  I advised him to finish off the 5mg  lisinopril dose then be sure to re-order the 2.5mg  tablets and continue the dose at 2.5mg  qd.  I also suggested that his elevated blood pressure readings may have been related to his use of testosterone cream, which he only uses sporadically.  He was asked to continue monitoring his home blood pressures and follow up with Dr. Herbie Baltimore in 2-3 months for his regular exam.   As far as the cost of Ranexa and Effient, his copays on each are $150/3 month supply thru his mail order pharmacy.  He was given  manufacturer copay cards on each med, which should bring his copays down to $5 and $25/month respectively.

## 2013-07-12 ENCOUNTER — Telehealth: Payer: Self-pay

## 2013-07-12 NOTE — Telephone Encounter (Signed)
Pt said his wife spoke with Shirlee Limerick earlier today and was told did not need a referral to [facility]; pt has been seeing [facility] for 9 years but [employer] said any HMO needs referral for any specialist. [employer] told pt [facility] is in network. Pt request call back [phone].

## 2013-08-02 ENCOUNTER — Ambulatory Visit: Payer: Medicare HMO | Admitting: Cardiology

## 2013-08-15 ENCOUNTER — Other Ambulatory Visit: Payer: Self-pay | Admitting: Family Medicine

## 2013-08-15 DIAGNOSIS — E785 Hyperlipidemia, unspecified: Secondary | ICD-10-CM

## 2013-08-15 DIAGNOSIS — R7303 Prediabetes: Secondary | ICD-10-CM

## 2013-08-15 DIAGNOSIS — I10 Essential (primary) hypertension: Secondary | ICD-10-CM

## 2013-08-15 DIAGNOSIS — Z125 Encounter for screening for malignant neoplasm of prostate: Secondary | ICD-10-CM

## 2013-08-15 DIAGNOSIS — E349 Endocrine disorder, unspecified: Secondary | ICD-10-CM

## 2013-08-17 ENCOUNTER — Other Ambulatory Visit (INDEPENDENT_AMBULATORY_CARE_PROVIDER_SITE_OTHER): Payer: Medicare HMO

## 2013-08-17 DIAGNOSIS — E291 Testicular hypofunction: Secondary | ICD-10-CM

## 2013-08-17 DIAGNOSIS — E349 Endocrine disorder, unspecified: Secondary | ICD-10-CM

## 2013-08-17 DIAGNOSIS — R7303 Prediabetes: Secondary | ICD-10-CM

## 2013-08-17 DIAGNOSIS — Z125 Encounter for screening for malignant neoplasm of prostate: Secondary | ICD-10-CM

## 2013-08-17 DIAGNOSIS — I10 Essential (primary) hypertension: Secondary | ICD-10-CM

## 2013-08-17 DIAGNOSIS — R7309 Other abnormal glucose: Secondary | ICD-10-CM

## 2013-08-17 DIAGNOSIS — E785 Hyperlipidemia, unspecified: Secondary | ICD-10-CM

## 2013-08-17 LAB — COMPREHENSIVE METABOLIC PANEL
ALT: 23 U/L (ref 0–53)
AST: 20 U/L (ref 0–37)
Albumin: 4.1 g/dL (ref 3.5–5.2)
Calcium: 9.1 mg/dL (ref 8.4–10.5)
Chloride: 103 mEq/L (ref 96–112)
Potassium: 4.2 mEq/L (ref 3.5–5.1)
Total Protein: 6.8 g/dL (ref 6.0–8.3)

## 2013-08-17 LAB — LIPID PANEL
Total CHOL/HDL Ratio: 7
VLDL: 56.8 mg/dL — ABNORMAL HIGH (ref 0.0–40.0)

## 2013-08-17 LAB — LDL CHOLESTEROL, DIRECT: Direct LDL: 119 mg/dL

## 2013-08-18 LAB — TESTOSTERONE, FREE, TOTAL, SHBG: Testosterone-% Free: 2.7 % (ref 1.6–2.9)

## 2013-08-23 ENCOUNTER — Encounter: Payer: Medicare HMO | Admitting: Family Medicine

## 2013-09-05 ENCOUNTER — Ambulatory Visit (INDEPENDENT_AMBULATORY_CARE_PROVIDER_SITE_OTHER): Payer: Medicare HMO | Admitting: Cardiology

## 2013-09-05 ENCOUNTER — Encounter: Payer: Self-pay | Admitting: Cardiology

## 2013-09-05 VITALS — BP 120/86 | HR 97 | Ht 74.0 in | Wt 256.7 lb

## 2013-09-05 DIAGNOSIS — I209 Angina pectoris, unspecified: Secondary | ICD-10-CM

## 2013-09-05 DIAGNOSIS — I208 Other forms of angina pectoris: Secondary | ICD-10-CM

## 2013-09-05 DIAGNOSIS — E669 Obesity, unspecified: Secondary | ICD-10-CM

## 2013-09-05 DIAGNOSIS — I255 Ischemic cardiomyopathy: Secondary | ICD-10-CM

## 2013-09-05 DIAGNOSIS — I83893 Varicose veins of bilateral lower extremities with other complications: Secondary | ICD-10-CM

## 2013-09-05 DIAGNOSIS — E291 Testicular hypofunction: Secondary | ICD-10-CM

## 2013-09-05 DIAGNOSIS — R7309 Other abnormal glucose: Secondary | ICD-10-CM

## 2013-09-05 DIAGNOSIS — I2089 Other forms of angina pectoris: Secondary | ICD-10-CM

## 2013-09-05 DIAGNOSIS — I2510 Atherosclerotic heart disease of native coronary artery without angina pectoris: Secondary | ICD-10-CM

## 2013-09-05 DIAGNOSIS — Z951 Presence of aortocoronary bypass graft: Secondary | ICD-10-CM

## 2013-09-05 DIAGNOSIS — I2589 Other forms of chronic ischemic heart disease: Secondary | ICD-10-CM

## 2013-09-05 DIAGNOSIS — R7303 Prediabetes: Secondary | ICD-10-CM

## 2013-09-05 DIAGNOSIS — E785 Hyperlipidemia, unspecified: Secondary | ICD-10-CM

## 2013-09-05 DIAGNOSIS — I10 Essential (primary) hypertension: Secondary | ICD-10-CM

## 2013-09-05 MED ORDER — DIGOXIN 125 MCG PO TABS: 0.1250 mg | ORAL_TABLET | Freq: Every day | ORAL | Status: DC

## 2013-09-05 NOTE — Patient Instructions (Addendum)
Your physician has requested that you have a lower  extremity venous duplex for venous insuff. This test is an ultrasound of the veins in the legs . It looks at venous blood flow that carries blood from the heart to the legs . Allow one hour for a Lower. There are no restrictions or special instructions.  Start taking Digoxin .125 MG once a day   Your physician wants you to follow-up in 6 months. You will receive a reminder letter in the mail two months in advance. If you don't receive a letter, please call our office to schedule the follow-up appointment.

## 2013-09-06 ENCOUNTER — Ambulatory Visit (INDEPENDENT_AMBULATORY_CARE_PROVIDER_SITE_OTHER): Payer: Medicare HMO | Admitting: Family Medicine

## 2013-09-06 ENCOUNTER — Encounter: Payer: Self-pay | Admitting: Family Medicine

## 2013-09-06 VITALS — BP 130/88 | HR 87 | Temp 97.8°F | Ht 74.0 in | Wt 249.5 lb

## 2013-09-06 DIAGNOSIS — I2510 Atherosclerotic heart disease of native coronary artery without angina pectoris: Secondary | ICD-10-CM

## 2013-09-06 DIAGNOSIS — R21 Rash and other nonspecific skin eruption: Secondary | ICD-10-CM | POA: Insufficient documentation

## 2013-09-06 DIAGNOSIS — Z789 Other specified health status: Secondary | ICD-10-CM

## 2013-09-06 DIAGNOSIS — I2589 Other forms of chronic ischemic heart disease: Secondary | ICD-10-CM

## 2013-09-06 DIAGNOSIS — E785 Hyperlipidemia, unspecified: Secondary | ICD-10-CM

## 2013-09-06 DIAGNOSIS — I255 Ischemic cardiomyopathy: Secondary | ICD-10-CM | POA: Insufficient documentation

## 2013-09-06 DIAGNOSIS — R7309 Other abnormal glucose: Secondary | ICD-10-CM

## 2013-09-06 DIAGNOSIS — R7303 Prediabetes: Secondary | ICD-10-CM

## 2013-09-06 DIAGNOSIS — R4589 Other symptoms and signs involving emotional state: Secondary | ICD-10-CM

## 2013-09-06 DIAGNOSIS — N529 Male erectile dysfunction, unspecified: Secondary | ICD-10-CM

## 2013-09-06 DIAGNOSIS — Z1211 Encounter for screening for malignant neoplasm of colon: Secondary | ICD-10-CM

## 2013-09-06 DIAGNOSIS — E291 Testicular hypofunction: Secondary | ICD-10-CM

## 2013-09-06 DIAGNOSIS — Z0000 Encounter for general adult medical examination without abnormal findings: Secondary | ICD-10-CM

## 2013-09-06 DIAGNOSIS — F418 Other specified anxiety disorders: Secondary | ICD-10-CM | POA: Insufficient documentation

## 2013-09-06 DIAGNOSIS — Z23 Encounter for immunization: Secondary | ICD-10-CM

## 2013-09-06 MED ORDER — SIMVASTATIN 20 MG PO TABS: 20.0000 mg | ORAL_TABLET | Freq: Every day | ORAL | Status: DC

## 2013-09-06 NOTE — Assessment & Plan Note (Signed)
Reviewed cardiovascular risks of testosterone supplementation Pt desires to continue for now, may decide to discontinue in future. Feels effect on fatigue. Doesn't use regularly.

## 2013-09-06 NOTE — Assessment & Plan Note (Signed)
Worse itch at night. rec try benadryl at night time to help itch. Consider testing for pinworms if not better.

## 2013-09-06 NOTE — Patient Instructions (Addendum)
For cholesterol - start simvastatin (zocor) 20mg  daily.  If tolerated, we may increase dose. Flu shot today. stool kit today. Let's watch mood - let me know if you'd like to try medicine for this. Good to see you today, call us with questions. Try benadryl at night for rash and for sleep.

## 2013-09-06 NOTE — Assessment & Plan Note (Signed)
Stable. Discussed importance of carb control in diet and weight loss.

## 2013-09-06 NOTE — Assessment & Plan Note (Signed)
Continue effient, aspirin, and ranexa.  Digoxin started by cards yesterday.

## 2013-09-06 NOTE — Assessment & Plan Note (Signed)
Intolerant of viagra 2/2 flushing. Avoid PDE5 inhibitors for now Would want cards input prior to starting med like daily cialis.

## 2013-09-06 NOTE — Assessment & Plan Note (Signed)
Discussed options - start simvastatin 20mg  daily - if tolerated, consider increase to 40mg  .  O/w will do trial of pravastatin.

## 2013-09-06 NOTE — Assessment & Plan Note (Signed)
Preventative protocols reviewed and updated unless pt declined. Discussed healthy diet and lifestyle.  Flu shot today. iFOB today. DRE/PSA today.

## 2013-09-06 NOTE — Assessment & Plan Note (Signed)
Discussed stressors as well as prevalence of post-MI depression.  Pt has trouble dealing with decreased physical stamina given extensive cardiac hx. Pt declines pharmacotherapy for now.

## 2013-09-06 NOTE — Assessment & Plan Note (Signed)
Digoxin started.

## 2013-09-06 NOTE — Progress Notes (Signed)
Subjective:    Patient ID: Calvin Francis, male    DOB: 12/30/1960, 52 y.o.   MRN: [id number]  HPI CC: CPE  Broke toe 2 wks ago.  Also had recent dental work.  Using hydrocodone prn pain.  Rash around bottom - comes and goes.  Red rash, pruritic, scratches at night.  Seems to improve with neosporin.  Trouble with sweating.  H/o MI x3 - on ranexa 1000mg  daily. Cardiologist is Dr. Herbie Baltimore, sees regularly.  S/p 5v CABG 2005, stent placed 2011. Seen cards yesterday [facility] - started on digoxin.  Told EF 40%.  Discussing ICD. HTN - only on lisinopril. HLD - crestor and lipitor caused worsening myalgias.  Interested in another statin trial Hypogonadism - on androgel.  Started by previous PCP, I now prescribe this. Does not take regularly.  Aware of CV risk.  Helps with energy level and libido. ED issues - takes OTC herbal supplement.  Depressed mood mainly stemming from this.  viagra caused flushing.  CV[facility] - [facility] - Dr. Herbie Baltimore  Denies falls in last year. Endorses depression - PHQ2 = 2.  Occasionally feels overwhelmed.  Denies anhedonia.  Denies SI/HI.  PHQ9 = 5/27. Declines pharmacotherapy.  Preventative:  Colon cancer screening - discussed, requests iFOB. Prostate cancer screening - none in past - done today Flu shot today Tdap done - 11/2012  Caffeine: 1 cup coffee in am  Lives with wife, 2 dogs  Grown children, 3 grandchildren  Occupation: disability from cardiac status for last year, prior worked for [employer]  Activity: walks driveway, limited by chest pain/SOB  Diet: some water, fruits/vegetables occasionally   Medications and allergies reviewed and updated in chart.  Past histories reviewed and updated if relevant as below. Patient Active Problem List   Diagnosis Date Noted  . Prediabetes 08/15/2013  . Exertional angina -- Class II 12/27/2012  . Abnormal cardiovascular stress tests - METS test with VO2 of ~50, HIGH Risk Myoview - Anterior Ischemia 12/15/2012   . Testosterone deficiency   . CAD (coronary artery disease)   . Barrett's esophagus   . Varicose veins   . HTN (hypertension)   . HLD (hyperlipidemia)   . Smoking history    Past Medical History  Diagnosis Date  . CAD (coronary artery disease) 2005    severe; now nonrevascularizable, h/o MI x3 s/p CABG and PCI (native and bypass CAD), SVG-RCA & SVG-OM1 100%; patent LIMA -LAD (severe ~70-80% distal LAD disease after LIMA) & Free Radial OM2   . GERD (gastroesophageal reflux disease)     ?barrett's, with esophageal dysmotility, on omeprazole  . Barrett's esophagus ~2008    s/p EGD several years ago with [place] [facility]  . Varicose veins     L leg s/p surgery  . Bronchitis, mucopurulent recurrent   . HTN (hypertension)   . HLD (hyperlipidemia)   . Smoking history quit 04/2012  . ED (erectile dysfunction)   . Prediabetes 2009  . Gout   . Testosterone deficiency   . Stable angina     Chronic  . Ischemic cardiomyopathy Echo 10/2012    EF ~40%; moderate Posterior HypoKinesis, minld-moderate inferior Hypokinesis; mild RV dilation, mild concentric LVH   Past Surgical History  Procedure Laterality Date  . Coronary artery bypass graft  02/27/04    LIMA -LAD, Free Radial-OM2 -- patent; SVG-OM1, SVG-RCA, - 100% occluded by recent cath  . Coronary angioplasty with stent placement  2011    s/p autologous vessel blockage  . Vein  surgery  2011    [facility] - h/o vericose veins  . Esophagogastroduodenoscopy    . US echocardiography  09/2012    mild LVH, EF 40%, mod posterior and inf wall hypokinesis   History  Substance Use Topics  . Smoking status: Light Tobacco Smoker    Types: Cigarettes    Last Attempt to Quit: 04/28/2012  . Smokeless tobacco: Never Used     Comment: long time smoker, smokes occasional -anxiety  . Alcohol Use: Yes     Comment: occasionally   Family History  Problem Relation Age of Onset  . Diabetes Paternal Aunt   . Coronary artery disease Paternal Uncle   . Stroke  Maternal Uncle   . Cancer Neg Hx    Allergies  Allergen Reactions  . Clopidogrel Other (See Comments)    Disoriented   . Codeine Rash   Current Outpatient Prescriptions on File Prior to Visit  Medication Sig Dispense Refill  . ALPRAZolam (XANAX) 0.25 MG tablet Take 0.25 mg by mouth as needed for sleep.      Marland Kitchen aspirin 81 MG chewable tablet Chew 81 mg by mouth daily.      . digoxin (LANOXIN) 0.125 MG tablet Take 1 tablet (0.125 mg total) by mouth daily.  30 tablet  6  . HYDROcodone-acetaminophen (NORCO/VICODIN) 5-325 MG per tablet 5-325 tablets.      Boris Lown Oil Omega-3 300 MG CAPS Take 300 mg by mouth daily.      . lansoprazole (PREVACID) 15 MG capsule Take 15 mg by mouth 2 (two) times daily.      Marland Kitchen lisinopril (PRINIVIL,ZESTRIL) 2.5 MG tablet Take 5 mg by mouth daily.      . prasugrel (EFFIENT) 10 MG TABS Take 1 tablet (10 mg total) by mouth daily.  90 tablet  3  . ranolazine (RANEXA) 1000 MG SR tablet Take 1 tablet (1,000 mg total) by mouth daily. In the morning  90 tablet  3  . testosterone (ANDROGEL) 50 MG/5GM GEL Place 5 g onto the skin daily. Compounded 10mL syringe x 3 for 1 mo supply, 1 cc daily.  150 g  5   No current facility-administered medications on file prior to visit.     Review of Systems  Constitutional: Negative for fever, chills, activity change, appetite change, fatigue and unexpected weight change.  HENT: Negative for hearing loss and neck pain.   Eyes: Negative for visual disturbance.  Respiratory: Negative for cough, chest tightness, shortness of breath and wheezing.   Cardiovascular: Positive for chest pain (occaisonal). Negative for palpitations and leg swelling.  Gastrointestinal: Negative for nausea, vomiting, abdominal pain, diarrhea, constipation, blood in stool and abdominal distention.  Genitourinary: Negative for hematuria and difficulty urinating.  Musculoskeletal: Negative for myalgias and arthralgias.  Skin: Negative for rash.  Neurological:  Negative for dizziness, seizures, syncope and headaches.  Hematological: Negative for adenopathy. Does not bruise/bleed easily.  Psychiatric/Behavioral: Negative for dysphoric mood. The patient is not nervous/anxious.        Objective:   Physical Exam  Nursing note and vitals reviewed. Constitutional: He is oriented to person, place, and time. He appears well-developed and well-nourished. No distress.  HENT:  Head: Normocephalic and atraumatic.  Mouth/Throat: Oropharynx is clear and moist. No oropharyngeal exudate.  Eyes: Conjunctivae and EOM are normal. Pupils are equal, round, and reactive to light. No scleral icterus.  Neck: Normal range of motion. Neck supple. Carotid bruit is not present.  Cardiovascular: Normal rate, regular rhythm, normal heart sounds and  intact distal pulses.   No murmur heard. Pulses:      Radial pulses are 2+ on the right side, and 2+ on the left side.  Pulmonary/Chest: Effort normal and breath sounds normal. No respiratory distress. He has no wheezes. He has no rales.  Abdominal: Soft. Bowel sounds are normal. He exhibits no distension and no mass. There is no tenderness. There is no rebound and no guarding.  Genitourinary: Rectum normal and prostate normal. Rectal exam shows no external hemorrhoid, no internal hemorrhoid, no fissure, no mass, no tenderness and anal tone normal. Prostate is not enlarged (15gm) and not tender.  Musculoskeletal: Normal range of motion. He exhibits no edema.  Lymphadenopathy:    He has no cervical adenopathy.  Neurological: He is alert and oriented to person, place, and time.  CN grossly intact, station and gait intact  Skin: Skin is warm and dry. No rash noted.  Dry papulo pustular rash on perianal region  Psychiatric: He has a normal mood and affect. His behavior is normal. Judgment and thought content normal.       Assessment & Plan:

## 2013-09-13 ENCOUNTER — Encounter: Payer: Self-pay | Admitting: *Deleted

## 2013-09-18 ENCOUNTER — Encounter: Payer: Self-pay | Admitting: Cardiology

## 2013-09-18 DIAGNOSIS — I83899 Varicose veins of unspecified lower extremities with other complications: Secondary | ICD-10-CM | POA: Insufficient documentation

## 2013-09-18 DIAGNOSIS — I25810 Atherosclerosis of coronary artery bypass graft(s) without angina pectoris: Secondary | ICD-10-CM | POA: Insufficient documentation

## 2013-09-18 NOTE — Assessment & Plan Note (Signed)
So he has been intolerant to Crestor or Lipitor in the past, I see simvastatin listed, diagnoses been intolerant of that as well. We just checked last in August which were not at goal at all. I recommended to his PCP to consider starting either Zetia or TriCor. He is due to see his PCP soon for labs. Just in case, I will order a set of lipids and chemistries.

## 2013-09-18 NOTE — Assessment & Plan Note (Signed)
He does have chronic stable angina symptoms. He is on Ranexa which seems that it pretty well controlled. As I mentioned above May consider beta blockers, although he did  not tolerate beta blockers in the past. is also been not great: Nitrates due to headaches.  This unfortunate adult he doesn't have any revascularization options for him because of his severe native vessel disease as poor distal targets.  He has a lot of anxiety and probably some depression symptoms as a result of this. I would just continue encourage him to continue to exercise and use nitroglycerin if need be. As long as not resting pain, angina is not harmful as long as it subsides.

## 2013-09-18 NOTE — Assessment & Plan Note (Signed)
Overall severe native CAD with 2 vein grafts occluded with the native vessel is totally occluded. The distal LAD is also very severe. Just because of the significance of that lesion, we made a decision to continue on dual antiplatelet therapy unless her significant bleeding issues. He was intolerant of Plavix at that gives her rash or some other interaction. Therefore he continues on Effient plus aspirin. Is also on a PPI. There are 2 PPI as listed, and he is not clear on which one he is using.

## 2013-09-18 NOTE — Assessment & Plan Note (Signed)
MS until his swelling will worsen it had been before. He is also getting more cramps in his legs. There was some thought that he may have some other things to work on him when he had his VNUS ablation of his saphenous vein.  Plan: Followup Lower Extremity Venous Insufficiency Dopplers --> if abnormal, will refer to Dr. Rennis Golden.

## 2013-09-18 NOTE — Assessment & Plan Note (Signed)
His blood pressure is well-controlled on lisinopril. He really only on low-dose. He never really had that much blood pressure with which to work.

## 2013-09-18 NOTE — Assessment & Plan Note (Signed)
Again we discussed important dietary modification. His limiting anginal symptoms, he really needs to work on dietary modification. Dr. reduce carbohydrates, and fatty foods. In with increased vegetables and fruits with lean proteins at smaller portion sizes.

## 2013-09-18 NOTE — Assessment & Plan Note (Signed)
I reiterated his primary physicians recommendations for dietary modification as well as continued efforts for exercise. Weight loss is vital.

## 2013-09-18 NOTE — Assessment & Plan Note (Addendum)
His EF is roughly 40%, not low enough really to pursue defibrillator. He will have that much to live 2 heart failure symptoms. This is a really simply exertion related. He is not even on diuretic. He is not currently on beta blocker, this is partly because it is history of fatigue, and presumed intolerance. I would like to probably pursue restarting a beta blocker such as Bystolic in the future.  He is on Ranexa for small vessel disease, but was relatively intolerant of Imdur in the past. He seemed to do pretty well with cruise and was not as much limited by dyspnea and chest pain he was by if he hurt his leg.  Plan: Continue lisinopril, DAPT, and digoxin. Consider adding Bystolic, if blood pressure will tolerate.

## 2013-09-18 NOTE — Progress Notes (Signed)
PCP: Eustaquio Boyden, MD  Clinic Note: Chief Complaint  Patient presents with  . 4 month visit    no chest pain the normal, sob wth activity, no edema ,broke toe on a cruise    HPI: Calvin Francis is a 52 y.o. male with a PMH below who presents today for a long overdue followup. He was a former patient of Dr. Julieanne Manson, who I first met back in December  2011 when he presented with a Non-STEMI. At that time he underwent staged PCI of SVG-OM1 with a bare-metal stent. He had a known occlusion of the SVG-RCA but patent LIMA LAD and free radial to OM 2. The distal LAD had a significant 70% stenosis noted then as well. He said to her catheterizations and several stress tests since then. The upshot of these catheterizations is that he has an occluded SVG-OM1 prior to the stent, as well as the occluded SVG-RCA with occluded native RCA. An attempt was made to intervene on the native circumflex which was unsuccessful. So now he hasn't LIMA-LAD with a distal LAD 70% lesion not amenable to PCI, along with a radial graft to the OM 2. He has chronic stable angina in quite a bit of anxiety..  Interval History: Actually since I last saw him he had gone for his heart catheterization that was done right before any years of 2013. There was no new lesions to intervene upon. This is done secondary to an abnormal CPET test where his peak VO2 was only 52%. He actually has been doing okay since I last saw him. He remains on dual antiplatelet therapy, and was placed on Ranexa along with his ACE inhibitor and digoxin. He had labs checked in August at that showed his lipids were poorly controlled, so he was started on simvastatin which far as I can tell he did not tolerate.  He just returned from a cruise, which he really enjoyed. He was however limited because he injured his foot tripping over a step. He was really more limited due to leg pain and he was noted to his angina or shortness of breath.  He just basically  anxious about the fact that his angina. He's had some occasional palpitations and occasional orthostatic hypotension symptoms. Nothing significant. His Motrin and Valium more than usual. There cramping more and more uncomfortable.  The remainder of Cardiovascular ROS is as follows: positive for - chest pain, dyspnea on exertion, edema, orthopnea and palpitations negative for - irregular heartbeat, loss of consciousness, murmur, paroxysmal nocturnal dyspnea, rapid heart rate or shortness of breath  Additional cardiac review of systems: Lightheadedness / dizziness- occasionally he has orthostatic symptoms, syncope/near-syncope - no; TIA/amaurosis fugax - no Melena - no, hematochezia no; hematuria - no; nosebleeds - no; claudication - no;  he does have mild bruising  Past Medical History  Diagnosis Date  . History of Non-STEMI (non-ST elevated myocardial infarction) 02/22/2004    Three-vessel disease --> referred for CABG  . 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 --> CABG x 4  . S/P CABG x 02 February 2004    LIMA-LAD, SVG to OM 1, SVG to RCA,fRad-OM2  . Abnormal nuclear stress test August 2005    Occluded SVG-RCA  . History of Non-STEMI (non-ST elevated myocardial infarction) December 2011    Hazy lesion in SVG-OM1 --> staged PCI with 4.0 mm x  20 mm vision BMS (4.5 mm)  . CAD (coronary artery disease) of bypass graft May 2012; December 2013    Cath for angina and abnormal stress test: SVG-OM1 now occluded, attempt at PCI to the native circumflex OM1 unsuccessful; distal LAD beyond patent LIMA 70-80% (not PCI amenable) free radial-OM 2 patent; EF 45-50%  . Stable angina 11/2012    Chronic,some what stable but still present; cardiac cath results above, no significant change from 2012  . S/P most recent cardiac catheterization December 2013    severe; now nonrevascularizable, h/o MI  x3 s/p CABG and PCI (native and bypass CAD), SVG-RCA & SVG-OM1 100%; patent LIMA -LAD (severe ~70-80% distal LAD disease after LIMA) & Free Radial OM2   . Ischemic cardiomyopathy Echo 10/2012    EF ~40%; moderate Posterior HypoKinesis, minld-moderate inferior Hypokinesis; mild RV dilation, mild concentric LVH  . HLD (hyperlipidemia)     Statin intolerant  . GERD (gastroesophageal reflux disease)     ?barrett's, with esophageal dysmotility, on omeprazole  . Barrett's esophagus     s/p EGD several years ago with [facility]  . Varicose veins December 2013    with venous reflux status VNUS ablation of left greater saphenous wein  . Hypertension, benign   . Former heavy cigarette smoker (20-39 per day) May 2013   . Glucose intolerance (impaired glucose tolerance)  2009    Prediabetes  . Erectile dysfunction   . Testosterone deficiency     On replacement; goal is low normal.  . Gout   . Bronchitis, mucopurulent recurrent     Prior Cardiac Evaluation and Past Surgical History: Past Surgical History  Procedure Laterality Date  . Vein surgery  2011    solomon - h/o vericose veins  . US echocardiography  09/2012    mild LVH, EF 40%, mod posterior and inf wall hypokinesis,mild concentric LVH;; RV dilated, normal  fx.trace MR  . Coronary artery bypass graft  02/27/04    LIMA -LAD, Free Radial-OM2 -- patent; SVG-OM1, SVG-RCA, - 100% occluded by recent cath  . Met/cpet  11/02/2012    suboptimal effort but peak VO2  was 52% which relatively significant. Myoview was suggestive for ant. ischemia may go along with his distal LAD   . Lower venous extremity doppler Left 08/15/2011    normal ,s/p vein ablation  . Nm myocar perf wall motion  12/15/2012    high risk study  . Coronary angioplasty with stent placement  12/25/2010    s/p autologous vessel blockage  . Cardiac catheterization  May 01 2011    knowwn occlusion of SVG to RCA  as well as native RCA ; PCI to the SVG to OM1 ;patent LIMA to LAD with  diffuse distal  LAD 70-80%,small  vessel dx not thought to be amenable to PCI .RCA 100% occluded ,OM2 patent,Circ diseased aftr OM1. EF 45-50%  . Cardiac catheterization - stable from 2012; no PCI options; Med Rx  12/27/2012    Allergies  Allergen Reactions  . Clopidogrel Other (See Comments)    Disoriented   . Statins   . Zetia [Ezetimibe]   . Codeine Rash    Current Outpatient Prescriptions  Medication Sig Dispense Refill  . ALPRAZolam (XANAX) 0.25 MG tablet Take 0.25 mg by mouth as needed for sleep.      Marland Kitchen aspirin 81 MG chewable tablet Chew 81 mg by mouth daily.      Boris Lown Oil Omega-3 300 MG CAPS Take 300 mg by mouth daily.      Marland Kitchen  lansoprazole (PREVACID) 15 MG capsule Take 15 mg by mouth 2 (two) times daily.      Marland Kitchen lisinopril (PRINIVIL,ZESTRIL) 2.5 MG tablet Take 5 mg by mouth daily.      . prasugrel (EFFIENT) 10 MG TABS Take 1 tablet (10 mg total) by mouth daily.  90 tablet  3  . ranolazine (RANEXA) 1000 MG SR tablet Take 1 tablet (1,000 mg total) by mouth daily. In the morning  90 tablet  3  . testosterone (ANDROGEL) 50 MG/5GM GEL Place 5 g onto the skin daily. Compounded 10mL syringe x 3 for 1 mo supply, 1 cc daily.  150 g  5  . digoxin (LANOXIN) 0.125 MG tablet Take 1 tablet (0.125 mg total) by mouth daily.  30 tablet  6  . HYDROcodone-acetaminophen (NORCO/VICODIN) 5-325 MG per tablet 5-325 tablets.      . simvastatin (ZOCOR) 20 MG tablet Take 1 tablet (20 mg total) by mouth at bedtime.  30 tablet  3   No current facility-administered medications for this visit.    History   Social History Narrative   Caffeine: 1 cup coffee in am   Lives with wife, 2 dogs   Married, 2 Grown children, 3 grandchildren   Occupation: disability from cardiac status for last year, prior worked for [employer]   Activity: walks driveway, limited by chest pain/SOB -- is trying to pick up his activity.   Diet: good water, fruits/vegetables daily   ROS: A comprehensive Review of Systems -  Negative except As above, also noted occasional palpitations and orthopnea with some cramping. His energy level has been pretty well-controlled. Also noted is bilateral knee and hip pain. Psychological ROS: positive for - anxiety  PHYSICAL EXAM BP 120/86  Pulse 97  Ht 6\' 2"  (1.88 m)  Wt 256 lb 11.2 oz (116.438 kg)  BMI 32.94 kg/m2 General: he is a very pleasant, healthy-appearing gentleman in no acutedistress, A&Ox3, answers questions appropriately. He is well nourished and well groomed. HEENT: NCAT. EOMI. MMM. Anicteric sclerae.  Neck: Supple with no LAN, JVD, or carotid bruit.  Heart: RRR. Normal S1, S2. No M/R/G. Nondisplaced PMI. No significant ectopy.  Lungs: CTAB. Nonlabored. Normal effort. Good air movement.  Abdomen: Mildly obese but otherwise soft/NT/ND/NABS. No HSM.  Extremities: No C/C/E but mild varicosities bilaterally (has noted improvement s/p VNUS ablation)  [id number] today: Yes Rate: 97 , Rhythm: Normal Sinus Rhythm, Nonspecific ST-T Changes.  Recent Labs: 08/17/2013  TC 208, HDL 30, TG 284, unable to calculate LDL (previous LDL in 2011 was 111 with a CC of 190)  ASSESSMENT / PLAN: Ischemic cardiomyopathy His EF is roughly 40%, not low enough really to pursue defibrillator. He will have that much to live 2 heart failure symptoms. This is a really simply exertion related. He is not even on diuretic. He is not currently on beta blocker, this is partly because it is history of fatigue, and presumed intolerance. I would like to probably pursue restarting a beta blocker such as Bystolic in the future.  He is on Ranexa for small vessel disease, but was relatively intolerant of Imdur in the past. He seemed to do pretty well with cruise and was not as much limited by dyspnea and chest pain he was by if he hurt his leg.  Plan: Continue lisinopril, DAPT, and digoxin. Consider adding Bystolic, if blood pressure will tolerate.   Exertional angina -- Class II He does have  chronic stable angina symptoms. He is on Ranexa which seems  that it pretty well controlled. As I mentioned above May consider beta blockers, although he did  not tolerate beta blockers in the past. is also been not great: Nitrates due to headaches.  This unfortunate adult he doesn't have any revascularization options for him because of his severe native vessel disease as poor distal targets.  He has a lot of anxiety and probably some depression symptoms as a result of this. I would just continue encourage him to continue to exercise and use nitroglycerin if need be. As long as not resting pain, angina is not harmful as long as it subsides.   CAD (coronary artery disease) of native and graft disease-  Overall severe native CAD with 2 vein grafts occluded with the native vessel is totally occluded. The distal LAD is also very severe. Just because of the significance of that lesion, we made a decision to continue on dual antiplatelet therapy unless her significant bleeding issues. He was intolerant of Plavix at that gives her rash or some other interaction. Therefore he continues on Effient plus aspirin. Is also on a PPI. There are 2 PPI as listed, and he is not clear on which one he is using.  HTN (hypertension) His blood pressure is well-controlled on lisinopril. He really only on low-dose. He never really had that much blood pressure with which to work.  HLD (hyperlipidemia) So he has been intolerant to Crestor or Lipitor in the past, I see simvastatin listed, diagnoses been intolerant of that as well. We just checked last in August which were not at goal at all. I recommended to his PCP to consider starting either Zetia or TriCor. He is due to see his PCP soon for labs. Just in case, I will order a set of lipids and chemistries.  Hypogonadism male I also reviewed risks of the testosterone supplementation with him. He now does this risk but really says that he doesn't use the testosterone he  significantly decompensates. He says he does not use it every day. Again my recommendation is to shoot for upper limit of abnormal versus limit of normal.  This would be our best attempt to mitigate the cardiovascular risk.  Prediabetes I reiterated his primary physicians recommendations for dietary modification as well as continued efforts for exercise. Weight loss is vital.  Varicose veins of leg with swelling MS until his swelling will worsen it had been before. He is also getting more cramps in his legs. There was some thought that he may have some other things to work on him when he had his VNUS ablation of his saphenous vein.  Plan: Followup Lower Extremity Venous Insufficiency Dopplers --> if abnormal, will refer to Dr. Rennis Golden.  Obesity (BMI 30-39.9) Again we discussed important dietary modification. His limiting anginal symptoms, he really needs to work on dietary modification. Dr. reduce carbohydrates, and fatty foods. In with increased vegetables and fruits with lean proteins at smaller portion sizes.   Orders Placed This Encounter  Procedures  . Lipid Profile  . Comp Met (CMET)  . EKG 12-Lead  . Lower Extremity Venous Duplex Bilateral    Venous insuff;  Varicose weins    Standing Status: Future     Number of Occurrences:      Standing Expiration Date: 09/05/2014    Order Specific Question:  Laterality    Answer:  Bilateral    Order Specific Question:  Where should this test be performed:    Answer:  [facility] [address]   Meds ordered  this encounter  Medications  . lansoprazole (PREVACID) 15 MG capsule    Sig: Take 15 mg by mouth 2 (two) times daily.  Marland Kitchen lisinopril (PRINIVIL,ZESTRIL) 2.5 MG tablet    Sig: Take 5 mg by mouth daily.  Marland Kitchen ALPRAZolam (XANAX) 0.25 MG tablet    Sig: Take 0.25 mg by mouth as needed for sleep.  Marland Kitchen HYDROcodone-acetaminophen (NORCO/VICODIN) 5-325 MG per tablet    Sig: 5-325 tablets.  . digoxin (LANOXIN) 0.125 MG tablet    Sig: Take 1 tablet  (0.125 mg total) by mouth daily.    Dispense:  30 tablet    Refill:  6   Followup: 6 months  Tyre W. Herbie Baltimore, M.D., M.S. [facility] [address] [place], [place]  [address]  [phone] Pager # [phone]

## 2013-09-18 NOTE — Assessment & Plan Note (Signed)
I also reviewed risks of the testosterone supplementation with him. He now does this risk but really says that he doesn't use the testosterone he significantly decompensates. He says he does not use it every day. Again my recommendation is to shoot for upper limit of abnormal versus limit of normal.  This would be our best attempt to mitigate the cardiovascular risk.

## 2013-09-28 ENCOUNTER — Other Ambulatory Visit: Payer: Self-pay | Admitting: *Deleted

## 2013-10-12 ENCOUNTER — Other Ambulatory Visit: Payer: Self-pay | Admitting: *Deleted

## 2013-10-12 MED ORDER — DIGOXIN 125 MCG PO TABS: 0.1250 mg | ORAL_TABLET | Freq: Every day | ORAL | Status: DC

## 2013-10-31 ENCOUNTER — Inpatient Hospital Stay (HOSPITAL_COMMUNITY): Admission: RE | Admit: 2013-10-31 | Payer: Medicare HMO | Source: Ambulatory Visit

## 2013-10-31 ENCOUNTER — Telehealth: Payer: Self-pay | Admitting: Cardiology

## 2013-10-31 NOTE — Telephone Encounter (Signed)
Please call-says the quanity of his prescription was put in wrong.

## 2013-10-31 NOTE — Telephone Encounter (Signed)
Returned call and pt verified x 2.  Pt stated he has been getting 30-day supply of Ranexa.  Stated he is supposed to take it twice daily and is only getting 30 pills and he should be getting a 90-day supply from the mail order pharmacy.  Pt informed record shows he should be taking 1000 mg once daily in AM.  Pt stated at some point it was changed and he can't remember when.  Pt also stated he usually takes it once a day b/c it is so expensive.  Pt informed RN will leave samples at front desk while this is being resolved.  Pt also informed RN will call [facility] as Rx on file written in June 2014 states to dispense #90 w/ 3 refills.  Pt verbalized understanding and agreed w/ plan.  Pt will pick up samples today.  Call to [facility].  Informed Rx in dispensing mode and unable to make changes, but Rx was received and for #90.  Stated initially PA was needed and 30-day supply was sent until approved and system automatically dispensed #30 each month.  Stated will update when out of dispensing mode.  Pt will need to call back next month for refill and after that 90-day supply will be dispensed.  Call to pt and informed.  Pt verbalized understanding and agreed w/ plan.

## 2013-11-17 ENCOUNTER — Other Ambulatory Visit: Payer: Self-pay

## 2013-11-17 MED ORDER — NITROGLYCERIN 0.4 MG SL SUBL: 0.4000 mg | SUBLINGUAL_TABLET | SUBLINGUAL | Status: DC | PRN

## 2013-11-17 NOTE — Telephone Encounter (Signed)
Rx was sent to pharmacy electronically. 

## 2013-11-18 ENCOUNTER — Ambulatory Visit (INDEPENDENT_AMBULATORY_CARE_PROVIDER_SITE_OTHER): Payer: Medicare HMO | Admitting: Family Medicine

## 2013-11-18 ENCOUNTER — Encounter: Payer: Self-pay | Admitting: Family Medicine

## 2013-11-18 VITALS — BP 110/80 | HR 68 | Temp 97.9°F | Wt 242.5 lb

## 2013-11-18 DIAGNOSIS — M79676 Pain in unspecified toe(s): Secondary | ICD-10-CM | POA: Insufficient documentation

## 2013-11-18 DIAGNOSIS — M79674 Pain in right toe(s): Secondary | ICD-10-CM

## 2013-11-18 DIAGNOSIS — M79609 Pain in unspecified limb: Secondary | ICD-10-CM

## 2013-11-18 LAB — CBC WITH DIFFERENTIAL/PLATELET
Basophils Absolute: 0 10*3/uL (ref 0.0–0.1)
Basophils Relative: 0.5 % (ref 0.0–3.0)
Eosinophils Absolute: 0.3 10*3/uL (ref 0.0–0.7)
Lymphocytes Relative: 27.8 % (ref 12.0–46.0)
MCHC: 33.4 g/dL (ref 30.0–36.0)
Monocytes Relative: 5.7 % (ref 3.0–12.0)
Neutrophils Relative %: 62.3 % (ref 43.0–77.0)
Platelets: 247 10*3/uL (ref 150.0–400.0)
RBC: 5.05 Mil/uL (ref 4.22–5.81)
WBC: 7.3 10*3/uL (ref 4.5–10.5)

## 2013-11-18 LAB — BASIC METABOLIC PANEL
GFR: 78.13 mL/min (ref >60.00)
Glucose, Bld: 83 mg/dL (ref 70–99)
Potassium: 4.1 mEq/L (ref 3.5–5.1)
Sodium: 133 mEq/L — ABNORMAL LOW (ref 135–145)

## 2013-11-18 MED ORDER — PREDNISONE 20 MG PO TABS: ORAL_TABLET | ORAL | Status: DC

## 2013-11-18 MED ORDER — COLCHICINE 0.6 MG PO TABS: 0.6000 mg | ORAL_TABLET | Freq: Every day | ORAL | Status: DC

## 2013-11-18 NOTE — Progress Notes (Signed)
  Subjective:    Patient ID: Calvin Francis, male    DOB: 02-14-1961, 52 y.o.   MRN: [id number]  HPI CC: red, swollen painful right great toe  Pain and swelling started on third toe 9 days ago and lasted about 2 days. Big toe then began hurting for last 7 days. Pain is described as severe throbbing that has increased and is worse at night. Light palpation, sheets touching toe, and weight bearing worsen pain. Warm water soak with" deep blue" natural supplement helped yesterday. Ibuprofen 600 mg twice daily for last week. Rarely eats red meat. Approx 4 glasses of wine a week.   Hx of multiple ingrown toenail removals. No diagnosed hx of gout but similar flares of pain that last 1-2 days. No hx arthritis.  Fractured left great toe 3 months ago, healed well.   Denies injury to foot, insect bite, fevers, chills, rashes, numbness or tingling, other joint pains.   Review of Systems Per HPI.    Objective:   Physical Exam Right great toe Erythematous, swollen distal joint.  No visible ingrown toenails or insect bites. Tenderness to light palpation. Decreased sensation to great toe. Decreased ROM 2/2 pain  Third toe mildly erythematous and swollen at distal joint  Full sensation and ROM to other toes. Pulses intact.   Left foot: Great toe without erythema, swelling, or deformity.  Full ROM, sensation, and pulses.     Assessment & Plan:  Suspect gout. Will obtain uric acid level, kidney panel, and CBC. Short course of prednisone to decrease inflammation.Take two tablets daily for 2 days followed by one tablet daily for 3 days.  Colchicine, take 2 tablets on the first day then one tablet daily as needed.  Given gout diet of foods to avoid.

## 2013-11-18 NOTE — Assessment & Plan Note (Addendum)
Anticipate gout related but will check blood work to help r/o joint infection. PMH includes gout but pt states never diagnosed. Treat with prednisone course and colchicine prn. Gout handout provided as well as gout diet handout.

## 2013-11-18 NOTE — Patient Instructions (Signed)
I think this may be gout Blood work today to help rule out infection. Treat with steroid course as well as colchicine to help decrease urate level. Push fluids and rest. Continue warm water soaks. Let us know if not improving as expected.  Gout Gout is an inflammatory arthritis caused by a buildup of uric acid crystals in the joints. Uric acid is a chemical that is normally present in the blood. When the level of uric acid in the blood is too high it can form crystals that deposit in your joints and tissues. This causes joint redness, soreness, and swelling (inflammation). Repeat attacks are common. Over time, uric acid crystals can form into masses (tophi) near a joint, destroying bone and causing disfigurement. Gout is treatable and often preventable. CAUSES  The disease begins with elevated levels of uric acid in the blood. Uric acid is produced by your body when it breaks down a naturally found substance called purines. Certain foods you eat, such as meats and fish, contain high amounts of purines. Causes of an elevated uric acid level include:  Being passed down from parent to child (heredity).  Diseases that cause increased uric acid production (such as obesity, psoriasis, and certain cancers).  Excessive alcohol use.  Diet, especially diets rich in meat and seafood.  Medicines, including certain cancer-fighting medicines (chemotherapy), water pills (diuretics), and aspirin.  Chronic kidney disease. The kidneys are no longer able to remove uric acid well.  Problems with metabolism. Conditions strongly associated with gout include:  Obesity.  High blood pressure.  High cholesterol.  Diabetes. Not everyone with elevated uric acid levels gets gout. It is not understood why some people get gout and others do not. Surgery, joint injury, and eating too much of certain foods are some of the factors that can lead to gout attacks. SYMPTOMS   An attack of gout comes on quickly. It  causes intense pain with redness, swelling, and warmth in a joint.  Fever can occur.  Often, only one joint is involved. Certain joints are more commonly involved:  Base of the big toe.  Knee.  Ankle.  Wrist.  Finger. Without treatment, an attack usually goes away in a few days to weeks. Between attacks, you usually will not have symptoms, which is different from many other forms of arthritis. DIAGNOSIS  Your caregiver will suspect gout based on your symptoms and exam. In some cases, tests may be recommended. The tests may include:  Blood tests.  Urine tests.  X-rays.  Joint fluid exam. This exam requires a needle to remove fluid from the joint (arthrocentesis). Using a microscope, gout is confirmed when uric acid crystals are seen in the joint fluid. TREATMENT  There are two phases to gout treatment: treating the sudden onset (acute) attack and preventing attacks (prophylaxis).  Treatment of an Acute Attack.  Medicines are used. These include anti-inflammatory medicines or steroid medicines.  An injection of steroid medicine into the affected joint is sometimes necessary.  The painful joint is rested. Movement can worsen the arthritis.  You may use warm or cold treatments on painful joints, depending which works best for you.  Treatment to Prevent Attacks.  If you suffer from frequent gout attacks, your caregiver may advise preventive medicine. These medicines are started after the acute attack subsides. These medicines either help your kidneys eliminate uric acid from your body or decrease your uric acid production. You may need to stay on these medicines for a very long time.  The early  phase of treatment with preventive medicine can be associated with an increase in acute gout attacks. For this reason, during the first few months of treatment, your caregiver may also advise you to take medicines usually used for acute gout treatment. Be sure you understand your  caregiver's directions. Your caregiver may make several adjustments to your medicine dose before these medicines are effective.  Discuss dietary treatment with your caregiver or dietitian. Alcohol and drinks high in sugar and fructose and foods such as meat, poultry, and seafood can increase uric acid levels. Your caregiver or dietician can advise you on drinks and foods that should be limited. HOME CARE INSTRUCTIONS   Do not take aspirin to relieve pain. This raises uric acid levels.  Only take over-the-counter or prescription medicines for pain, discomfort, or fever as directed by your caregiver.  Rest the joint as much as possible. When in bed, keep sheets and blankets off painful areas.  Keep the affected joint raised (elevated).  Apply warm or cold treatments to painful joints. Use of warm or cold treatments depends on which works best for you.  Use crutches if the painful joint is in your leg.  Drink enough fluids to keep your urine clear or pale yellow. This helps your body get rid of uric acid. Limit alcohol, sugary drinks, and fructose drinks.  Follow your dietary instructions. Pay careful attention to the amount of protein you eat. Your daily diet should emphasize fruits, vegetables, whole grains, and fat-free or low-fat milk products. Discuss the use of coffee, vitamin C, and cherries with your caregiver or dietician. These may be helpful in lowering uric acid levels.  Maintain a healthy body weight. SEEK MEDICAL CARE IF:   You develop diarrhea, vomiting, or any side effects from medicines.  You do not feel better in 24 hours, or you are getting worse. SEEK IMMEDIATE MEDICAL CARE IF:   Your joint becomes suddenly more tender, and you have chills or a fever. MAKE SURE YOU:   Understand these instructions.  Will watch your condition.  Will get help right away if you are not doing well or get worse. Document Released: 12/12/2000 Document Revised: 04/11/2013 Document  Reviewed: 07/28/2012 [facility] Patient Information 2014 [place], [place].

## 2013-11-18 NOTE — Progress Notes (Signed)
Patient seen and examined with PA student darcy paulus.  Note reviewed, agree with assessment and plan unless changes documented in my note.   CC:  R big toe swelling  9d h/o R toe pain (3rd toe), then over last 7 days having swollen red painful great toe.  Weight bearing, sheet touching toe worsens pain.  Throbbing pain.  Tried warm water soaks which may have helped.  Ibuprofen 600mg  bid doesn't help.  Avoids red meat.  4 glasses red wine weekly. H/o ingrown toenail removal. No h/o arthritis - rheum or osteo. Similar flares of pain that have resolved within 1-2 days. Denies insect bites, inciting trauma, injury. Denies fevers/chills, numbness of toe.  Past Medical History  Diagnosis Date  . History of Non-STEMI (non-ST elevated myocardial infarction) 02/22/2004    Three-vessel disease --> referred for CABG  . 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 --> CABG x 4  . S/P CABG x 02 February 2004    LIMA-LAD, SVG to OM 1, SVG to RCA,fRad-OM2  . Abnormal nuclear stress test August 2005    Occluded SVG-RCA  . History of Non-STEMI (non-ST elevated myocardial infarction) December 2011    Hazy lesion in SVG-OM1 --> staged PCI with 4.0 mm x 20 mm vision BMS (4.5 mm)  . CAD (coronary artery disease) of bypass graft May 2012; December 2013    Cath for angina and abnormal stress test: SVG-OM1 now occluded, attempt at PCI to the native circumflex OM1 unsuccessful; distal LAD beyond patent LIMA 70-80% (not PCI amenable) free radial-OM 2 patent; EF 45-50%  . Stable angina 11/2012    Chronic,some what stable but still present; cardiac cath results above, no significant change from 2012  . S/P most recent cardiac catheterization December 2013    severe; now nonrevascularizable, h/o MI x3 s/p CABG and PCI (native and bypass CAD), SVG-RCA & SVG-OM1 100%; patent LIMA -LAD (severe ~70-80% distal  LAD disease after LIMA) & Free Radial OM2   . Ischemic cardiomyopathy Echo 10/2012    EF ~40%; moderate Posterior HypoKinesis, minld-moderate inferior Hypokinesis; mild RV dilation, mild concentric LVH  . HLD (hyperlipidemia)     Statin intolerant  . GERD (gastroesophageal reflux disease)     ?barrett's, with esophageal dysmotility, on omeprazole  . Barrett's esophagus     s/p EGD several years ago with [place]  . Varicose veins December 2013    with venous reflux status VNUS ablation of left greater saphenous wein  . Hypertension, benign   . Former heavy cigarette smoker (20-39 per day) May 2013   . Glucose intolerance (impaired glucose tolerance)  2009    Prediabetes  . Erectile dysfunction   . Testosterone deficiency     On replacement; goal is low normal.  . Gout   . Bronchitis, mucopurulent recurrent     GEN: WDWN CM MSK: erythematous edematous red warm R great toe IP joint.  R 3rd toe also tender and swelling but better.  Indurated swelling superior IP joint of 1st and 3rd toes of right foot. 2+ DP bilaterally.

## 2013-11-18 NOTE — Progress Notes (Signed)
Pre-visit discussion using our clinic review tool. No additional management support is needed unless otherwise documented below in the visit note.  

## 2013-11-23 ENCOUNTER — Telehealth: Payer: Self-pay

## 2013-11-23 NOTE — Telephone Encounter (Signed)
Pt was seen 11/18/13 and is finishing prednisone today; pt said he is better but still having some pain in toe and pt wants to know if needs longer on predinsone or should pt take ibuprofen.Please advise.[facility].

## 2013-11-23 NOTE — Telephone Encounter (Signed)
If he has no reaction to ibuprofen- take that and update if no further improvement in the next week

## 2013-11-23 NOTE — Telephone Encounter (Signed)
Patient notified as instructed by telephone. 

## 2013-11-29 NOTE — Telephone Encounter (Addendum)
Pt left v/m; pt's toe did get better but pt starting to have same symptoms with toe and pt request med sent to [facility]. Swelling at base of Rt big toe started again on 11/26/13; no redness or pain yet. Pt request cb.

## 2013-11-29 NOTE — Telephone Encounter (Addendum)
I recommend he restart colchicine twice daily as needed for pain/swelling, update [place] if sxs persist despite this. Spoke with patient.

## 2013-12-30 ENCOUNTER — Other Ambulatory Visit: Payer: Self-pay | Admitting: Hematology and Oncology

## 2014-02-01 ENCOUNTER — Other Ambulatory Visit: Payer: Self-pay | Admitting: Hematology and Oncology

## 2014-03-08 ENCOUNTER — Telehealth: Payer: Self-pay | Admitting: Cardiology

## 2014-03-08 MED ORDER — RANOLAZINE ER 1000 MG PO TB12: 1000.0000 mg | ORAL_TABLET | Freq: Every day | ORAL | Status: DC

## 2014-03-08 NOTE — Telephone Encounter (Signed)
Rx was sent to pharmacy electronically. 

## 2014-03-08 NOTE — Telephone Encounter (Signed)
Please call the quaonity of his Ranexa prescription need to be changed to #[phone] please.Please send  [facility] to [facility].

## 2014-06-05 ENCOUNTER — Other Ambulatory Visit: Payer: Self-pay | Admitting: Hematology and Oncology

## 2014-06-06 ENCOUNTER — Ambulatory Visit (INDEPENDENT_AMBULATORY_CARE_PROVIDER_SITE_OTHER): Payer: Medicare HMO | Admitting: Cardiology

## 2014-06-06 ENCOUNTER — Encounter: Payer: Self-pay | Admitting: Cardiology

## 2014-06-06 VITALS — BP 112/80 | HR 92 | Ht 74.0 in | Wt 233.1 lb

## 2014-06-06 DIAGNOSIS — I10 Essential (primary) hypertension: Secondary | ICD-10-CM

## 2014-06-06 DIAGNOSIS — I208 Other forms of angina pectoris: Secondary | ICD-10-CM

## 2014-06-06 DIAGNOSIS — I2510 Atherosclerotic heart disease of native coronary artery without angina pectoris: Secondary | ICD-10-CM

## 2014-06-06 DIAGNOSIS — I209 Angina pectoris, unspecified: Secondary | ICD-10-CM

## 2014-06-06 DIAGNOSIS — E785 Hyperlipidemia, unspecified: Secondary | ICD-10-CM

## 2014-06-06 DIAGNOSIS — I255 Ischemic cardiomyopathy: Secondary | ICD-10-CM

## 2014-06-06 DIAGNOSIS — E669 Obesity, unspecified: Secondary | ICD-10-CM

## 2014-06-06 DIAGNOSIS — I2589 Other forms of chronic ischemic heart disease: Secondary | ICD-10-CM

## 2014-06-06 MED ORDER — FUROSEMIDE 20 MG PO TABS: ORAL_TABLET | ORAL | Status: DC

## 2014-06-06 MED ORDER — RANOLAZINE ER 1000 MG PO TB12: 1000.0000 mg | ORAL_TABLET | Freq: Every day | ORAL | Status: DC

## 2014-06-06 NOTE — Progress Notes (Signed)
PCP: Ria Bush, MD  Clinic Note: Chief Complaint  Patient presents with  . 9 MONTH VISIT    ocass. chest pain,  sob ,no edema, no energy, anxious    HPI: Calvin Francis is a 53 y.o. male with a Cardiovascular Problem List below who presents today for 9 month followup of his CAD/ischemic cardiomyopathy and chronic stable angina history. He was a former patient of Dr. Chase Picket, who I first met back in December 2011 when he presented with a Non-STEMI. At that time he underwent staged PCI of SVG-OM1 with a bare-metal stent. He had a known occlusion of the SVG-RCA but patent LIMA LAD and free radial to OM 2. The distal LAD had a significant 70% stenosis noted then as well. He said to her catheterizations and several stress tests since then. The upshot of these catheterizations is that he has an occluded SVG-OM1 prior to the stent, as well as the occluded SVG-RCA with occluded native RCA. An attempt was made to intervene on the native circumflex which was unsuccessful. So now he hasn't LIMA-LAD with a distal LAD 70% lesion not amenable to PCI, along with a radial graft to the OM 2. He has chronic stable angina in quite a bit of anxiety. His lipid control is very poor because of statin and other medication intolerance.  Interval History: He returns today really pretty much stable. He could not tolerate taking digoxin nor could he tolerate any statin. He still has poor energy and exertional chest tightness and dyspnea. It is stable in all was alleviated with rest. Very infrequent as he actually had to take nitroglycerin. He has noted a little bit of prolonged swelling in his legs that is mild but also has had a little bit of symptoms consistent with orthopnea. No PND. No rapid or irregular heartbeats or rhythms. He does get a little little orthostatic dizziness but no syncope or near syncope, TIA or amaurosis fugax symptoms. No melena hematochezia, hematuria, epistaxis. No claudication.  Past  Medical History  Diagnosis Date  . History of Non-STEMI (non-ST elevated myocardial infarction) 02/22/2004    Three-vessel disease --> referred for CABG  . 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 --> CABG x 4  . S/P CABG x 02 February 2004    LIMA-LAD, SVG to OM 1, SVG to RCA,fRad-OM2  . Abnormal nuclear stress test August 2005    Occluded SVG-RCA  . History of Non-STEMI (non-ST elevated myocardial infarction) December 2011    Hazy lesion in SVG-OM1 --> staged PCI with 4.0 mm x 20 mm vision BMS (4.5 mm)  . CAD (coronary artery disease) of bypass graft May 2012; December 2013    Cath for angina and abnormal stress test: SVG-OM1 now occluded, attempt at PCI to the native circumflex OM1 unsuccessful; distal LAD beyond patent LIMA 70-80% (not PCI amenable) free radial-OM 2 patent; EF 45-50%  . Stable angina 11/2012    Chronic,some what stable but still present; cardiac cath results above, no significant change from 2012  . S/P most recent cardiac catheterization December 2013    severe; now nonrevascularizable, h/o MI x3 s/p CABG and PCI (native and bypass CAD), SVG-RCA & SVG-OM1 100%; patent LIMA -LAD (severe ~70-80% distal LAD disease after LIMA) & Free Radial OM2   . Ischemic cardiomyopathy Echo 10/2012    EF ~40%; moderate Posterior HypoKinesis, minld-moderate inferior  Hypokinesis; mild RV dilation, mild concentric LVH  . HLD (hyperlipidemia)     Statin intolerant  . GERD (gastroesophageal reflux disease)     ?barrett's, with esophageal dysmotility, on omeprazole  . Barrett's esophagus     s/p EGD several years ago with [place]  . Varicose veins December 2013    with venous reflux status VNUS ablation of left greater saphenous wein  . Hypertension, benign   . Former heavy cigarette smoker (20-39 per day) May 2013   . Glucose intolerance (impaired glucose tolerance)  2009     Prediabetes  . Erectile dysfunction   . Testosterone deficiency     On replacement; goal is low normal.  . Gout   . Bronchitis, mucopurulent recurrent     Prior Cardiac Evaluation and Past Surgical History: Reviewed in [place] [facility] No Change in Social and Family History  ROS: A comprehensive Review of Systems - Negative except Fatigue, all swelling, leg cramping, decreased libido, and worsening sleep habits.  PHYSICAL EXAM BP 112/80  Pulse 92  Ht 6' 2" (1.88 m)  Wt 233 lb 1.6 oz (105.733 kg)  BMI 29.92 kg/m2 General: he is a very pleasant, healthy-appearing gentleman in no acutedistress, A&Ox3, answers questions appropriately. He is well nourished and well groomed.  HEENT: NCAT. EOMI. MMM. Anicteric sclerae.  Neck: Supple with no LAN, JVD, or carotid bruit.  Heart: RRR. Normal S1, S2. No M/R/G. Nondisplaced PMI. No significant ectopy.  Lungs: CTAB. Nonlabored. Normal effort. Good air movement.  Abdomen: Mildly obese but otherwise soft/NT/ND/NABS. No HSM.  Extremities: No C/C/E but mild varicosities bilaterally (has noted improvement s/p VNUS ablation)   Adult ECG Report  Rate: 92 ;  Rhythm: normal sinus rhythm; nonspecific ST and T wave changes -- no change from previous EKGs  Recent Labs: none:   ASSESSMENT / PLAN: He continues to smoke every now and then, but not more than a 1 cigarette or 2 a day at the most. Sometimes he'll go several days without a cigarette. I counseled him strongly to actually pull he quit, but he is not sure if she'll be able to because of his "nerves "  Ischemic cardiomyopathy Starting at a little bit of symptoms that are consistent now with true Class II almost III heart failure symptoms with moderate edema as well as more dyspnea. He is on ACE inhibitor without a beta blocker. Beta blocker is not being used because of his fatigue complaints. Blood pressure currently would not tolerate much more the way of  potentially antihypertensive medications. Continue Ranexa, statin, aspirin and Effient as well as statin. No real revascularization options at this point.  Exertional angina -- Class II Chronic stable angina symptoms. On Ranexa at max dose. Not actually on Imdur at this point. He'll be one potential option. Unfortunately no revascularizable options  CAD (coronary artery disease) of native and graft disease-  No resting angina symptoms and no worsening of his angina symptoms. Continuing dual antiplatelet therapy along with statin and ACE inhibitor. Not on beta blocker for reasons described above. On Ranexa with overall improved symptoms of angina. On Prevacid for dual antiplatelet GI coverage  HTN (hypertension) Well controlled.  HLD (hyperlipidemia)  he was not able tolerate either statin. I have recommended trying Zetia or TriCor, and not sure if this was actually done or not but he seemed to be reluctant to take any medications.  This will continue to be a problem as far the rapid progression of  his disease. He is very stubborn and unlikely to adjust his regimen.  Obesity (BMI 30-39.9) Here in his chart work on losing weight and stay active. He is not quite in the obese category currently but is very close    Orders Placed This Encounter  Procedures  . Lipid panel    Order Specific Question:  Has the patient fasted?    Answer:  Yes  . Comprehensive metabolic panel    Order Specific Question:  Has the patient fasted?    Answer:  Yes  . EKG 12-Lead    Followup: 6 months  Darvis W. Ellyn Hack, M.D., M.S. Interventional Cardiologist [facility]

## 2014-06-06 NOTE — Patient Instructions (Signed)
Watch weight  if your swelling increase you may take a extra dose of lasix a aday.  Please have labs work done in July 2015 ( one month from now. If cholesterol is still elevated ,may have an appointment with the lipid clinic   Your physician wants you to follow-up in 6 month Dr Herbie Baltimore.  You will receive a reminder letter in the mail two months in advance. If you don't receive a letter, please call our office to schedule the follow-up appointment.

## 2014-06-08 ENCOUNTER — Encounter: Payer: Self-pay | Admitting: Cardiology

## 2014-06-08 NOTE — Assessment & Plan Note (Signed)
No resting angina symptoms and no worsening of his angina symptoms. Continuing dual antiplatelet therapy along with statin and ACE inhibitor. Not on beta blocker for reasons described above. On Ranexa with overall improved symptoms of angina. On Prevacid for dual antiplatelet GI coverage

## 2014-06-08 NOTE — Assessment & Plan Note (Signed)
Here in his chart work on losing weight and stay active. He is not quite in the obese category currently but is very close

## 2014-06-08 NOTE — Assessment & Plan Note (Signed)
Well controlled 

## 2014-06-08 NOTE — Assessment & Plan Note (Signed)
Starting at a little bit of symptoms that are consistent now with true Class II almost III heart failure symptoms with moderate edema as well as more dyspnea. He is on ACE inhibitor without a beta blocker. Beta blocker is not being used because of his fatigue complaints. Blood pressure currently would not tolerate much more the way of potentially antihypertensive medications. Continue Ranexa, statin, aspirin and Effient as well as statin. No real revascularization options at this point.

## 2014-06-08 NOTE — Assessment & Plan Note (Signed)
he was not able tolerate either statin. I have recommended trying Zetia or TriCor, and not sure if this was actually done or not but he seemed to be reluctant to take any medications.  This will continue to be a problem as far the rapid progression of his disease. He is very stubborn and unlikely to adjust his regimen.

## 2014-06-08 NOTE — Assessment & Plan Note (Signed)
Chronic stable angina symptoms. On Ranexa at max dose. Not actually on Imdur at this point. He'll be one potential option. Unfortunately no revascularizable options

## 2014-07-10 ENCOUNTER — Telehealth: Payer: Self-pay | Admitting: Cardiology

## 2014-07-10 MED ORDER — PRASUGREL HCL 10 MG PO TABS: 10.0000 mg | ORAL_TABLET | Freq: Every day | ORAL | Status: DC

## 2014-07-10 MED ORDER — RANOLAZINE ER 1000 MG PO TB12: 1000.0000 mg | ORAL_TABLET | Freq: Every day | ORAL | Status: DC

## 2014-07-10 NOTE — Telephone Encounter (Signed)
Pt would like some samples of Ranexa and Effient,he is in the doughnut.[place].He might need to considering changing his medicine,because it is too expensive.

## 2014-07-10 NOTE — Telephone Encounter (Signed)
Patient notified that samples are at front desk.  

## 2014-08-14 ENCOUNTER — Telehealth: Payer: Self-pay | Admitting: Cardiology

## 2014-08-14 NOTE — Telephone Encounter (Signed)
Pt would like some samples of Effient please. °

## 2014-08-15 NOTE — Telephone Encounter (Signed)
Pt called again,wanted to know if we had any samples of Effient?

## 2014-08-16 MED ORDER — PRASUGREL HCL 10 MG PO TABS: 10.0000 mg | ORAL_TABLET | Freq: Every day | ORAL | Status: DC

## 2014-08-16 NOTE — Telephone Encounter (Signed)
Patient notified samples are at front desk for pick up.

## 2014-09-12 ENCOUNTER — Telehealth: Payer: Self-pay | Admitting: Cardiology

## 2014-09-12 MED ORDER — RANOLAZINE ER 1000 MG PO TB12: 1000.0000 mg | ORAL_TABLET | Freq: Every day | ORAL | Status: DC

## 2014-09-12 MED ORDER — PRASUGREL HCL 10 MG PO TABS: 10.0000 mg | ORAL_TABLET | Freq: Every day | ORAL | Status: DC

## 2014-09-12 NOTE — Telephone Encounter (Signed)
Tally called in stating that he has reached his donut hole with his insurance and would like to know if he could get some samples of Effient and Ranexa. Please call to notify him if we have any of the two.    Thanks

## 2014-09-12 NOTE — Telephone Encounter (Signed)
Spoke with patient and let him know samples would be up front for pick up

## 2014-09-20 ENCOUNTER — Other Ambulatory Visit: Payer: Self-pay | Admitting: Family Medicine

## 2014-09-20 NOTE — Telephone Encounter (Signed)
Last filled 11/21/2013

## 2014-10-03 ENCOUNTER — Telehealth: Payer: Self-pay | Admitting: Cardiology

## 2014-10-03 MED ORDER — PRASUGREL HCL 10 MG PO TABS: 10.0000 mg | ORAL_TABLET | Freq: Every day | ORAL | Status: DC

## 2014-10-03 MED ORDER — RANOLAZINE ER 1000 MG PO TB12: 1000.0000 mg | ORAL_TABLET | Freq: Every day | ORAL | Status: DC

## 2014-10-03 NOTE — Telephone Encounter (Signed)
Pt called in stating that he would like some samples of Effient. He has about a week left and would like to pick some up before the week is out because he will be going out of town next week. Please call  Thanks

## 2014-10-03 NOTE — Telephone Encounter (Signed)
Rx was sent to pharmacy electronically. Patient notified that samples are left at front desk for pick up

## 2014-10-17 ENCOUNTER — Ambulatory Visit (INDEPENDENT_AMBULATORY_CARE_PROVIDER_SITE_OTHER): Payer: Commercial Managed Care - HMO

## 2014-10-17 ENCOUNTER — Telehealth: Payer: Self-pay | Admitting: Family Medicine

## 2014-10-17 DIAGNOSIS — Z23 Encounter for immunization: Secondary | ICD-10-CM

## 2014-10-17 NOTE — Telephone Encounter (Signed)
Pt came in for flu shot today  He wanted to know if it is time for his pneumonia shot He stated if he needs this he will get it when he comes in in Nov

## 2014-10-17 NOTE — Telephone Encounter (Signed)
Spoke with patient and advised to discuss with Dr. Reece AgarG at his appt because typically it is given at age 53, but it may be given to him sooner due to his co-morbidities.

## 2014-10-31 ENCOUNTER — Ambulatory Visit: Payer: Self-pay | Admitting: Podiatry

## 2014-11-06 ENCOUNTER — Telehealth: Payer: Self-pay | Admitting: Cardiology

## 2014-11-06 MED ORDER — PRASUGREL HCL 10 MG PO TABS: 10.0000 mg | ORAL_TABLET | Freq: Every day | ORAL | Status: DC

## 2014-11-06 NOTE — Telephone Encounter (Signed)
Samples at front desk for patient. Patient notified and voiced understanding

## 2014-11-06 NOTE — Telephone Encounter (Signed)
Pt would like some samples of Effient please. °

## 2014-11-07 ENCOUNTER — Ambulatory Visit (INDEPENDENT_AMBULATORY_CARE_PROVIDER_SITE_OTHER): Payer: Commercial Managed Care - HMO

## 2014-11-07 ENCOUNTER — Ambulatory Visit (INDEPENDENT_AMBULATORY_CARE_PROVIDER_SITE_OTHER): Payer: Commercial Managed Care - HMO | Admitting: Podiatry

## 2014-11-07 ENCOUNTER — Ambulatory Visit: Payer: Self-pay

## 2014-11-07 VITALS — BP 119/74 | HR 88 | Resp 16 | Ht 74.0 in | Wt 233.0 lb

## 2014-11-07 DIAGNOSIS — B079 Viral wart, unspecified: Secondary | ICD-10-CM

## 2014-11-07 DIAGNOSIS — M722 Plantar fascial fibromatosis: Secondary | ICD-10-CM

## 2014-11-07 DIAGNOSIS — B078 Other viral warts: Secondary | ICD-10-CM

## 2014-11-07 MED ORDER — METHYLPREDNISOLONE (PAK) 4 MG PO TABS: ORAL_TABLET | ORAL | Status: DC

## 2014-11-07 MED ORDER — FLUOROURACIL 5 % EX CREA: TOPICAL_CREAM | Freq: Two times a day (BID) | CUTANEOUS | Status: AC

## 2014-11-07 NOTE — Progress Notes (Signed)
   Subjective:    Patient ID: Calvin Francis, male    DOB: 06-26-1961, 53 y.o.   MRN: [id number]  HPI Comments: Right heel pain for about a month, some days hurt more than others. First get up in the mornings cant walk . Plantar heel pain      Review of Systems  All other systems reviewed and are negative.      Objective:   Physical Exam: I have reviewed his past medical history medications allergy surgery social history and review of systems. Pulses are strongly palpable. Neurologic sensorium is intact per Semmes-Weinstein monofilament. Deep tendon reflexes are intact bilateral and muscle strength +5 over 5 dorsiflexion plantar flexors and inverters and everters all intrinsic musculature is intact. Orthopedic evaluation demonstrates all joints distal to the ankle have full range of motion without crepitation. He does however have pain on palpation medial calcaneal tubercle at the plantar fascial calcaneal insertion site. Radiographic evaluation does demonstrate soft tissue increase in density at the plantar fascial calcaneal insertion site right foot.        Assessment & Plan:  Assessment: Plantar fasciitis right.  Plan: Discussed etiology pathology conservative versus surgical therapies. We discussed appropriate shoe gear stretching exercises ice therapy and shoe gear modifications. I injected 10 mg of Kenalog and local anesthetic to the point of maximal tenderness of his right heel. He was then placed in a plantar fascial brace and dispensed night splint. I wrote a prescription for Medrol Dosepak and I will follow-up with him in 1 month

## 2014-11-21 ENCOUNTER — Ambulatory Visit (INDEPENDENT_AMBULATORY_CARE_PROVIDER_SITE_OTHER): Payer: Commercial Managed Care - HMO | Admitting: Family Medicine

## 2014-11-21 ENCOUNTER — Encounter: Payer: Self-pay | Admitting: Family Medicine

## 2014-11-21 VITALS — BP 116/78 | HR 92 | Temp 97.9°F | Ht 74.0 in | Wt 233.0 lb

## 2014-11-21 DIAGNOSIS — N529 Male erectile dysfunction, unspecified: Secondary | ICD-10-CM

## 2014-11-21 DIAGNOSIS — I10 Essential (primary) hypertension: Secondary | ICD-10-CM

## 2014-11-21 DIAGNOSIS — E663 Overweight: Secondary | ICD-10-CM

## 2014-11-21 DIAGNOSIS — E785 Hyperlipidemia, unspecified: Secondary | ICD-10-CM

## 2014-11-21 DIAGNOSIS — Z0000 Encounter for general adult medical examination without abnormal findings: Secondary | ICD-10-CM

## 2014-11-21 DIAGNOSIS — F329 Major depressive disorder, single episode, unspecified: Secondary | ICD-10-CM

## 2014-11-21 DIAGNOSIS — Z7189 Other specified counseling: Secondary | ICD-10-CM

## 2014-11-21 DIAGNOSIS — R4589 Other symptoms and signs involving emotional state: Secondary | ICD-10-CM

## 2014-11-21 DIAGNOSIS — F17200 Nicotine dependence, unspecified, uncomplicated: Secondary | ICD-10-CM

## 2014-11-21 DIAGNOSIS — I2510 Atherosclerotic heart disease of native coronary artery without angina pectoris: Secondary | ICD-10-CM

## 2014-11-21 DIAGNOSIS — Z1211 Encounter for screening for malignant neoplasm of colon: Secondary | ICD-10-CM

## 2014-11-21 DIAGNOSIS — I255 Ischemic cardiomyopathy: Secondary | ICD-10-CM

## 2014-11-21 DIAGNOSIS — Z125 Encounter for screening for malignant neoplasm of prostate: Secondary | ICD-10-CM

## 2014-11-21 DIAGNOSIS — R7303 Prediabetes: Secondary | ICD-10-CM

## 2014-11-21 NOTE — Assessment & Plan Note (Signed)
Preventative protocols reviewed and updated unless pt declined. Discussed healthy diet and lifestyle.  

## 2014-11-21 NOTE — Progress Notes (Signed)
BP 116/78 mmHg  Pulse 92  Temp(Src) 97.9 F (36.6 C) (Oral)  Ht 6\' 2"  (1.88 m)  Wt 233 lb (105.688 kg)  BMI 29.90 kg/m2   CC: medicare wellness  Subjective:    Patient ID: Calvin Francis, male    DOB: 05-Apr-1961, 53 y.o.   MRN: [id number]  HPI: Calvin LangDavid W Pratt is a 53 y.o. male presenting on 11/21/2014 for Annual Exam   Plantar fasciitis - battling with this over last 2 months. Has seen podiatry. Using brace.  H/o MI x3 - on ranexa 1000mg  daily. Cardiologist is Dr. Herbie BaltimoreHarding, sees regularly. S/p 5v CABG 2005, stent placed 2011. Told EF 40%. Discussing ICD. Has f/u with cardiology next month. HTN - only on lisinopril. HLD - crestor and lipitor caused worsening myalgias. Interested in another statin trial  Hypogonadism - on androgel. Started by previous PCP, I now prescribe this. Does not take regularly. Aware of CV risk. Helps with energy level and libido.  ED trouble maintaining erection - takes OTC herbal supplement.viagra caused flushing and headache (also tried levitra without improvement). Depressed mood mainly stemming from this. Interested in further treatment options.   CVTS - [facility] - Dr. Herbie BaltimoreHarding Body mass index is 29.9 kg/(m^2).  Passes hearing and vision screens today.  Denies falls in last year.  Endorses depression - PHQ2 = 2. Sad about decreased function since MI. Occasionally feels overwhelmed. Denies anhedonia. Denies SI/HI. PHQ9 = 10/27. Declines pharmacotherapy.   Seat belt and sunscreen use discussed.  Preventative: Colon cancer screening - discussed, requests iFOB. Did not turn in iFOB last year.  Prostate cancer screening - none in past - done today.  Flu shot 09/2014 Tdap done - 11/2012 Advanced directives: Has at home. Thinks wife is HCPOA. Will bring us a copy.  Caffeine: 1 cup coffee in am  Lives with wife, 2 dogs  Grown children, 3 grandchildren  Occupation: disability from cardiac status for last year, prior worked for [employer]  Activity: walks driveway, limited by chest pain/SOB  Diet: some water, fruits/vegetables occasionally   Relevant past medical, surgical, family and social history reviewed and updated as indicated.  Allergies and medications reviewed and updated. Current Outpatient Prescriptions on File Prior to Visit  Medication Sig  . ALPRAZolam (XANAX) 0.25 MG tablet Take 0.25 mg by mouth as needed for sleep.  . APPLE CIDER VINEGAR PO Take by mouth.  . COLCRYS 0.6 MG tablet TAKE AS DIRECTED 2 TABLETS BY MOUTH ON FIRST DAY THEN ONE DAILY AS NEEDED  . hydrOXYzine (ATARAX/VISTARIL) 10 MG tablet Take 10 mg by mouth 3 (three) times daily as needed.  Boris Lown. Krill Oil Omega-3 300 MG CAPS Take 300 mg by mouth daily.  . lansoprazole (PREVACID SOLUTAB) 15 MG disintegrating tablet Take 15 mg by mouth daily at 12 noon.  Marland Kitchen. lisinopril (PRINIVIL,ZESTRIL) 2.5 MG tablet Take 2.5 mg by mouth daily.   . nitroGLYCERIN (NITROSTAT) 0.4 MG SL tablet Place 1 tablet (0.4 mg total) under the tongue every 5 (five) minutes as needed for chest pain.  . prasugrel (EFFIENT) 10 MG TABS tablet Take 1 tablet (10 mg total) by mouth daily.  . ranolazine (RANEXA) 1000 MG SR tablet Take 1 tablet (1,000 mg total) by mouth daily. In the morning  . fluorouracil (EFUDEX) 5 % cream Apply topically 2 (two) times daily. (Patient not taking: Reported on 11/21/2014)  . omeprazole (PRILOSEC OTC) 20 MG tablet Take 20 mg by mouth daily.  Marland Kitchen. triamcinolone cream (KENALOG)  0.1 % Apply 1 application topically 2 (two) times daily as needed.   No current facility-administered medications on file prior to visit.    Review of Systems  Constitutional: Negative for fever, chills, activity change, appetite change, fatigue and unexpected weight change.  HENT: Negative for hearing loss.   Eyes: Negative for visual disturbance.  Respiratory: Positive for chest tightness and shortness of breath. Negative for cough and wheezing.   Cardiovascular: Positive  for chest pain. Negative for palpitations and leg swelling.  Gastrointestinal: Positive for constipation. Negative for nausea, vomiting, abdominal pain, diarrhea, blood in stool and abdominal distention.  Genitourinary: Negative for hematuria and difficulty urinating.  Musculoskeletal: Negative for myalgias, arthralgias and neck pain.  Skin: Negative for rash.  Neurological: Negative for dizziness, seizures, syncope and headaches.  Hematological: Negative for adenopathy. Does not bruise/bleed easily.  Psychiatric/Behavioral: Negative for dysphoric mood. The patient is not nervous/anxious.    Per HPI unless specifically indicated above    Objective:    BP 116/78 mmHg  Pulse 92  Temp(Src) 97.9 F (36.6 C) (Oral)  Ht 6\' 2"  (1.88 m)  Wt 233 lb (105.688 kg)  BMI 29.90 kg/m2  Physical Exam  Constitutional: He is oriented to person, place, and time. He appears well-developed and well-nourished. No distress.  HENT:  Head: Normocephalic and atraumatic.  Right Ear: Hearing, tympanic membrane, external ear and ear canal normal.  Left Ear: Hearing, tympanic membrane, external ear and ear canal normal.  Nose: Nose normal.  Mouth/Throat: Uvula is midline, oropharynx is clear and moist and mucous membranes are normal. No oropharyngeal exudate, posterior oropharyngeal edema or posterior oropharyngeal erythema.  Eyes: Conjunctivae and EOM are normal. Pupils are equal, round, and reactive to light. No scleral icterus.  Neck: Normal range of motion. Neck supple. No thyromegaly present.  Cardiovascular: Normal rate, regular rhythm, normal heart sounds and intact distal pulses.   No murmur heard. Pulses:      Radial pulses are 2+ on the right side, and 2+ on the left side.  Pulmonary/Chest: Effort normal and breath sounds normal. No respiratory distress. He has no wheezes. He has no rales.  Abdominal: Soft. Bowel sounds are normal. He exhibits no distension and no mass. There is no tenderness. There is  no rebound and no guarding.  Genitourinary: Rectum normal and prostate normal. Rectal exam shows no external hemorrhoid, no internal hemorrhoid, no fissure, no mass, no tenderness and anal tone normal. Prostate is not enlarged (20gm) and not tender.  Musculoskeletal: Normal range of motion. He exhibits no edema.  Lymphadenopathy:    He has no cervical adenopathy.  Neurological: He is alert and oriented to person, place, and time.  CN grossly intact, station and gait intact  Skin: Skin is warm and dry. No rash noted.  Psychiatric: He has a normal mood and affect. His behavior is normal. Judgment and thought content normal.  Nursing note and vitals reviewed.      Assessment & Plan:   Problem List Items Addressed This Visit    Smoker    Continue to encourage cessation.    Prediabetes (Chronic)    Lab Results  Component Value Date   HGBA1C 6.1 08/17/2013  check a1c when returns for fasting labs.    Overweight    Continue regular walking. Walks 5 d/wk about 1 mi.     Medicare annual wellness visit, initial - Primary    I have personally reviewed the Medicare Annual Wellness questionnaire and have noted 1. The patient's medical  and social history 2. Their use of alcohol, tobacco or illicit drugs 3. Their current medications and supplements 4. The patient's functional ability including ADL's, fall risks, home safety risks and hearing or visual impairment. 5. Diet and physical activity 6. Evidence for depression or mood disorders The patients weight, height, BMI have been recorded in the chart.  Hearing and vision has been addressed. I have made referrals, counseling and provided education to the patient based review of the above and I have provided the pt with a written personalized care plan for preventive services. Provider list updated - see scanned questionairre.  Reviewed preventative protocols and updated unless pt declined.    Ischemic cardiomyopathy (Chronic)    Continue  f/u with cardiology.    HTN (hypertension) (Chronic)    Chronic, stable on lisinopril. Continue. Unclear if he's on 2.5mg  or 5mg  daily.    HLD (hyperlipidemia) (Chronic)    Intolerant of statins in past as well as zetia. I have asked him to return in next 1-2 wks for fasting labs.    Healthcare maintenance    Preventative protocols reviewed and updated unless pt declined. Discussed healthy diet and lifestyle.    ED (erectile dysfunction) of organic origin    Intolerant to 2 PDE5 inhibitors in past. Would like further evaluation for this. Will refer to urology.    Depressed mood    Discussed this - declines pharmacotherapy.    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    Advanced care planning/counseling discussion    Has at home. Thinks wife is HCPOA. Will bring [place] a copy.     Other Visit Diagnoses    Colon cancer screening        Relevant Orders       Fecal occult blood, imunochemical        Follow up plan: No Follow-up on file.

## 2014-11-21 NOTE — Assessment & Plan Note (Signed)
Continue f/u with cards

## 2014-11-21 NOTE — Assessment & Plan Note (Signed)
Lab Results  Component Value Date   HGBA1C 6.1 08/17/2013  check a1c when returns for fasting labs.

## 2014-11-21 NOTE — Assessment & Plan Note (Signed)
Continue regular walking. Walks 5 d/wk about 1 mi.

## 2014-11-21 NOTE — Assessment & Plan Note (Signed)
Continue f/u with cardiology.  

## 2014-11-21 NOTE — Assessment & Plan Note (Signed)

## 2014-11-21 NOTE — Patient Instructions (Addendum)
Stool kit today - return in next few weeks. Return at your convenience fasting for blood work in next 1-2 weeks Quit smoking. We will refer you to urology Good to see you today, call us with questions. Return as needed or in 1 year for next wellness visit.

## 2014-11-21 NOTE — Assessment & Plan Note (Signed)
Has at home. Thinks wife is HCPOA. Will bring us a copy.

## 2014-11-21 NOTE — Assessment & Plan Note (Signed)
Intolerant to 2 PDE5 inhibitors in past. Would like further evaluation for this. Will refer to urology.

## 2014-11-21 NOTE — Assessment & Plan Note (Signed)
Discussed this - declines pharmacotherapy.

## 2014-11-21 NOTE — Assessment & Plan Note (Signed)
Chronic, stable on lisinopril. Continue. Unclear if he's on 2.5mg  or 5mg  daily.

## 2014-11-21 NOTE — Assessment & Plan Note (Signed)
Continue to encourage cessation. 

## 2014-11-21 NOTE — Assessment & Plan Note (Signed)
Intolerant of statins in past as well as zetia. I have asked him to return in next 1-2 wks for fasting labs.

## 2014-11-22 ENCOUNTER — Telehealth: Payer: Self-pay | Admitting: Family Medicine

## 2014-11-22 NOTE — Telephone Encounter (Signed)
emmi emailed °

## 2014-11-29 ENCOUNTER — Other Ambulatory Visit: Payer: Self-pay | Admitting: Cardiology

## 2014-11-29 MED ORDER — RANOLAZINE ER 500 MG PO TB12: 1000.0000 mg | ORAL_TABLET | Freq: Every day | ORAL | Status: DC

## 2014-11-29 MED ORDER — PRASUGREL HCL 10 MG PO TABS: 10.0000 mg | ORAL_TABLET | Freq: Every day | ORAL | Status: DC

## 2014-11-29 NOTE — Telephone Encounter (Signed)
Pt would like some samples of Ranexa and Effient please.

## 2014-11-29 NOTE — Telephone Encounter (Signed)
Medication Samples have been provided to the patient and left at front desk  Drug name: Effient  Qty: 4 bottles  LOT: [id number]  Exp.Date: 8/16                     Ranexa         4 boxes                          [id number]                                       9/18   Patient notified   Feather Berrie J 2:04 PM 11/29/2014

## 2014-12-04 ENCOUNTER — Encounter: Payer: Self-pay | Admitting: Cardiology

## 2014-12-04 ENCOUNTER — Ambulatory Visit (INDEPENDENT_AMBULATORY_CARE_PROVIDER_SITE_OTHER): Payer: Commercial Managed Care - HMO | Admitting: Cardiology

## 2014-12-04 VITALS — BP 122/72 | HR 87 | Ht 74.0 in | Wt 233.0 lb

## 2014-12-04 DIAGNOSIS — I25708 Atherosclerosis of coronary artery bypass graft(s), unspecified, with other forms of angina pectoris: Secondary | ICD-10-CM

## 2014-12-04 DIAGNOSIS — I25119 Atherosclerotic heart disease of native coronary artery with unspecified angina pectoris: Secondary | ICD-10-CM

## 2014-12-04 DIAGNOSIS — I208 Other forms of angina pectoris: Secondary | ICD-10-CM

## 2014-12-04 DIAGNOSIS — F329 Major depressive disorder, single episode, unspecified: Secondary | ICD-10-CM

## 2014-12-04 DIAGNOSIS — Z720 Tobacco use: Secondary | ICD-10-CM

## 2014-12-04 DIAGNOSIS — E785 Hyperlipidemia, unspecified: Secondary | ICD-10-CM

## 2014-12-04 DIAGNOSIS — F17200 Nicotine dependence, unspecified, uncomplicated: Secondary | ICD-10-CM

## 2014-12-04 DIAGNOSIS — I255 Ischemic cardiomyopathy: Secondary | ICD-10-CM

## 2014-12-04 DIAGNOSIS — R4589 Other symptoms and signs involving emotional state: Secondary | ICD-10-CM

## 2014-12-04 DIAGNOSIS — I10 Essential (primary) hypertension: Secondary | ICD-10-CM

## 2014-12-04 DIAGNOSIS — I25810 Atherosclerosis of coronary artery bypass graft(s) without angina pectoris: Secondary | ICD-10-CM

## 2014-12-04 DIAGNOSIS — Z9889 Other specified postprocedural states: Secondary | ICD-10-CM

## 2014-12-04 DIAGNOSIS — Z9861 Coronary angioplasty status: Secondary | ICD-10-CM

## 2014-12-04 DIAGNOSIS — Z889 Allergy status to unspecified drugs, medicaments and biological substances status: Secondary | ICD-10-CM

## 2014-12-04 DIAGNOSIS — Z789 Other specified health status: Secondary | ICD-10-CM

## 2014-12-04 MED ORDER — RANOLAZINE ER 1000 MG PO TB12: ORAL_TABLET | ORAL | Status: DC

## 2014-12-04 MED ORDER — CLOPIDOGREL BISULFATE 75 MG PO TABS: 75.0000 mg | ORAL_TABLET | Freq: Every day | ORAL | Status: DC

## 2014-12-04 NOTE — Patient Instructions (Signed)
Your physician has recommended making the following medication changes: 1. STOP Effient 2. START Clopidogrel - take 1 tablet daily  Dr Herbie BaltimoreHarding wants you to follow-up in 6 months. You will receive a reminder letter in the mail one months in advance. If you don't receive a letter, please call our office to schedule the follow-up appointment.

## 2014-12-04 NOTE — Progress Notes (Signed)
PCP: Ria Bush, MD  Clinic Note: Chief Complaint  Patient presents with  . Follow-up  . Coronary Artery Disease    History of CABG and PCI  . Cardiomyopathy    Ischemic  . Hyperlipidemia    Statin intolerant   HPI: Calvin Francis is a 53 y.o. male with a Cardiovascular Problem List below who presents today for 9 month followup of his CAD/ischemic cardiomyopathy and chronic stable angina history. He was a former patient of Dr. Chase Picket, who I first met back in December 2011 when he presented with a Non-STEMI w/ staged PCI of SVG-OM1 with a BMS. He has had several catheterizations and several stress tests since then:  occluded SVG-OM1 prior to the stent,   occluded SVG-RCA with occluded native RCA.   Patent LIMA-LAD with a distal LAD 70% lesion not amenable to PCI  Patent Radial Graft to OM2  An attempt was made to intervene on the native circumflex which was unsuccessful.  His lipid control is very poor because of statin and other medication intolerance.  Interval History: Calvin Francis presents today pretty much in his usual state of health. He has chronic stable angina but is not limited to his routine day-to-day activities. As long as he is on his Ranexa, he has tolerable anginal symptoms. Unfortunately with the combination Effient plus Ranexa, he goes into the donut hole very early. He denies any true heart failure symptoms of PND, orthopnea or significant edema. He has not had any resting angina unless it is very cold outside.  He still has poor energy and exertional chest tightness and dyspnea that is alleviated with rest. Despite this, he very infrequently uses when necessary nitroglycerin.  He denies any rapid or irregular heartbeats or rhythm, but does have occasional short-lived palpitations.  Besides having mildthostatic dizziness, he denies syncope /near syncope, TIA or amaurosis fugax symptoms. No melena hematochezia, hematuria, epistaxis. No claudication. He could  not tolerate taking digoxin nor could he tolerate any statin.  Past Medical History  Diagnosis Date  . History of Non-STEMI (non-ST elevated myocardial infarction) 02/22/2004    Three-vessel disease --> referred for CABG  . 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 --> CABG x 4  . S/P CABG x 02 February 2004    LIMA-LAD, SVG to OM 1, SVG to RCA,fRad-OM2  . Abnormal nuclear stress test August 2005    Occluded SVG-RCA  . History of Non-STEMI (non-ST elevated myocardial infarction) December 2011    Hazy lesion in SVG-OM1 --> staged PCI with 4.0 mm x 20 mm vision BMS (4.5 mm)  . CAD (coronary artery disease) of bypass graft May 2012; December 2013    Cath for angina and abnormal stress test: SVG-OM1 now occluded, attempt at PCI to the native circumflex OM1 unsuccessful; distal LAD beyond patent LIMA 70-80% (not PCI amenable) free radial-OM 2 patent; EF 45-50%  . Stable angina 11/2012    Chronic,some what stable but still present; cardiac cath results above, no significant change from 2012  . S/P most recent cardiac catheterization December 2013    severe; now nonrevascularizable, h/o MI x3 s/p CABG and PCI (native and bypass CAD), SVG-RCA & SVG-OM1 100%; patent LIMA -LAD (severe ~70-80% distal LAD disease after LIMA) & Free Radial OM2   . Ischemic cardiomyopathy Echo 10/2012    EF ~40%; moderate Posterior HypoKinesis, minld-moderate inferior Hypokinesis; mild  RV dilation, mild concentric LVH  . HLD (hyperlipidemia)     Statin intolerant  . GERD (gastroesophageal reflux disease)     ?barrett's, with esophageal dysmotility, on omeprazole  . Barrett's esophagus     s/p EGD several years ago with [facility]  . Varicose veins December 2013    with venous reflux status VNUS ablation of left greater saphenous wein  . Hypertension, benign   . Former heavy cigarette smoker (20-39 per day)  May 2013   . Glucose intolerance (impaired glucose tolerance)  2009    Prediabetes  . Erectile dysfunction   . Testosterone deficiency     On replacement; goal is low normal.  . Gout   . Bronchitis, mucopurulent recurrent     Prior Cardiac Evaluation and Past Surgical History: Reviewed in [place] [facility] No Change in Social and Family History  - still @ ~1/2 ppd; changed diet habits -> no longer fast food etc.  ROS: A comprehensive Review of Systems - Review of Systems  Constitutional: Positive for malaise/fatigue.  HENT: Negative for nosebleeds.   Respiratory: Positive for cough (AM cough  - from smoking). Negative for hemoptysis, sputum production and wheezing.   Cardiovascular: Positive for leg swelling (occasional mild LEE). Negative for claudication.  Gastrointestinal: Negative for blood in stool and melena.  Genitourinary: Negative for hematuria.  Musculoskeletal: Positive for myalgias (occasional night cramps) and joint pain.  Neurological: Positive for dizziness (occasional postional). Negative for seizures.  Endo/Heme/Allergies: Bruises/bleeds easily.  Psychiatric/Behavioral: The patient is nervous/anxious (Intermittent anxiety attacks,during which she will feels heart rate go up and have a sense of flushing and dizziness. Usually these occur, he is able to get himself out of the environment, and and takes deep breaths to  get hims out of the "spelll". ).        Occasionally uses PRN Xanax; Decreased Libido  All other systems reviewed and are negative.      Wt Readings from Last 3 Encounters:  12/04/14 233 lb (105.688 kg)  11/21/14 233 lb (105.688 kg)  11/07/14 233 lb (105.688 kg)  was 256 lb in 08/2013 - stopped drinking soft drinks  PHYSICAL EXAM BP 122/72 mmHg  Pulse 87  Ht 6' 2"  (1.88 m)  Wt 233 lb (105.688 kg)  BMI 29.90 kg/m2 General: he is a very pleasant, healthy-appearing gentleman in no acutedistress, A&Ox3, answers  questions appropriately. He is well nourished and well groomed.  HEENT: NCAT. EOMI. MMM. Anicteric sclerae.  Neck: Supple with no LAN, JVD, or carotid bruit.  Heart: RRR. Normal S1, S2. No M/R/G. Nondisplaced PMI. No significant ectopy.  Lungs: CTAB. Nonlabored. Normal effort. Good air movement.  Abdomen: Mildly obese but otherwise soft/NT/ND/NABS. No HSM.  Extremities: No C/C/E but mild varicosities bilaterally (has noted improvement s/p VNUS ablation)   Adult ECG Report  Rate: 92 ;  Rhythm: normal sinus rhythm; R-ward axis (109), nonspecific ST and T wave changes -- no change from previous EKGs  Recent Labs:  Lab Results  Component Value Date   CHOL 208* 08/17/2013   HDL 30.50* 08/17/2013   LDLCALC *111 12/22/2010   LDLDIRECT 119.0 08/17/2013   TRIG 284.0* 08/17/2013   CHOLHDL 7 08/17/2013    ASSESSMENT / PLAN:  Atherosclerotic heart disease of native coronary artery with angina pectoris Symptoms are relatively stable as long as he is on Ranexa. Unfortunately with the donut hole issue, he has been taking 1000 mg once a day as opposed to twice a day  on occasion to "stretch out his medication." He is on an ACE inhibitor but no beta blocker due to his fatigue. He is statin intolerant.  Plan: Continue Ranexa prescription for 1000 mg twice a day. Maybe switching him from Effient to Plavix which should allow for more room in his donut hole .  Stable angina: Class I-II Renew Ranexa. In the past she did not do well with Imdur, however we may need to consider long-acting nitrate if he is not able to maintain max dose of Ranexa. At present he did not seem to have any revascularizable options. Would not pursue further stress testing UNLESS his symptoms worsen.  CAD (coronary artery disease) of bypass graft See above.   With his long-standing history of graft disease and native disease, would continue surveillance stress testing. He is 2 years out from his last test. Would wait at least  another year or 2 depending on symptoms.  Ischemic cardiomyopathy He remains in the borderline zone for ICD recommendation. For the most part, his EF has been on the 40-45% range. Unfortunately he did not tolerate digoxin or beta blockers due to bradycardia and fatigue. He is on an ACE inhibitor at very low dose with a stable blood pressure now. Since he has positional dizziness, I'm reluctant to increase the dose for now, however we could consider increasing to 5 mg or 2.5 mg twice a day. If he was able tolerate 5 mg of lisinopril, I can consider switching to Entresto.  Essential hypertension I think he is still only taking 2.5 mg of Lisinopril - BP is stable & has orthostatic dizziness occasionally -> would be nice to have him on stable 5 mg dose in order to allow conversion to [place] (although financially, this may be a non-starter).  History of percutaneous coronary intervention He is currently on Effient, however this is coming quite a financial constraint on him. He is far enough out now from his PCI but I think we can safely switch him to Plavix (although he did not have a therapeutic P2Y12 result) along with aspirin.  Hyperlipidemia with target LDL less than 70 with statin intolerance;  He has been intolerant of statins plus Zetia and I think even fenofibrate.  I will refer his name to our pharmacy team to determine if he would be a candidate for PCSK9 Inhibitor therapy (also a $$ issue)  Depressed mood He is chronically depressed mood with probably more dysthymia and also anxiety. He does have when necessary Xanax, but has refused antidepressant medications.  Smoker He is only smoking about 3-4 cigarettes a day, but this made worse when he has his anxiety episodes. Smoking cessation instruction/counseling given:  counseled patient on the dangers of tobacco use, advised patient to stop smoking, and reviewed strategies to maximize success.    Orders Placed This Encounter    Procedures  . EKG 12-Lead    Followup: 6 months  Welden W. Ellyn Hack, M.D., M.S. Interventional Cardiologist [facility]

## 2014-12-05 ENCOUNTER — Ambulatory Visit: Payer: Commercial Managed Care - HMO | Admitting: Podiatry

## 2014-12-05 ENCOUNTER — Encounter: Payer: Self-pay | Admitting: Cardiology

## 2014-12-05 NOTE — Assessment & Plan Note (Signed)
He is chronically depressed mood with probably more dysthymia and also anxiety. He does have when necessary Xanax, but has refused antidepressant medications.

## 2014-12-05 NOTE — Assessment & Plan Note (Addendum)
He has been intolerant of statins plus Zetia and I think even fenofibrate.  I will refer his name to our pharmacy team to determine if he would be a candidate for PCSK9 Inhibitor therapy (also a $$ issue)

## 2014-12-05 NOTE — Assessment & Plan Note (Signed)
He remains in the borderline zone for ICD recommendation. For the most part, his EF has been on the 40-45% range. Unfortunately he did not tolerate digoxin or beta blockers due to bradycardia and fatigue. He is on an ACE inhibitor at very low dose with a stable blood pressure now. Since he has positional dizziness, I'm reluctant to increase the dose for now, however we could consider increasing to 5 mg or 2.5 mg twice a day. If he was able tolerate 5 mg of lisinopril, I can consider switching to Entresto.

## 2014-12-05 NOTE — Assessment & Plan Note (Signed)
He is only smoking about 3-4 cigarettes a day, but this made worse when he has his anxiety episodes. Smoking cessation instruction/counseling given:  counseled patient on the dangers of tobacco use, advised patient to stop smoking, and reviewed strategies to maximize success.

## 2014-12-05 NOTE — Assessment & Plan Note (Signed)
I think he is still only taking 2.5 mg of Lisinopril - BP is stable & has orthostatic dizziness occasionally -> would be nice to have him on stable 5 mg dose in order to allow conversion to [place] (although financially, this may be a non-starter).

## 2014-12-05 NOTE — Assessment & Plan Note (Signed)
He is currently on Effient, however this is coming quite a financial constraint on him. He is far enough out now from his PCI but I think we can safely switch him to Plavix (although he did not have a therapeutic P2Y12 result) along with aspirin.

## 2014-12-05 NOTE — Assessment & Plan Note (Signed)
Renew Ranexa. In the past she did not do well with Imdur, however we may need to consider long-acting nitrate if he is not able to maintain max dose of Ranexa. At present he did not seem to have any revascularizable options. Would not pursue further stress testing UNLESS his symptoms worsen.

## 2014-12-05 NOTE — Assessment & Plan Note (Signed)
See above.   With his long-standing history of graft disease and native disease, would continue surveillance stress testing. He is 2 years out from his last test. Would wait at least another year or 2 depending on symptoms.

## 2014-12-05 NOTE — Assessment & Plan Note (Signed)
Symptoms are relatively stable as long as he is on Ranexa. Unfortunately with the donut hole issue, he has been taking 1000 mg once a day as opposed to twice a day on occasion to "stretch out his medication." He is on an ACE inhibitor but no beta blocker due to his fatigue. He is statin intolerant.  Plan: Continue Ranexa prescription for 1000 mg twice a day. Maybe switching him from Effient to Plavix which should allow for more room in his donut hole .

## 2014-12-06 ENCOUNTER — Other Ambulatory Visit (INDEPENDENT_AMBULATORY_CARE_PROVIDER_SITE_OTHER): Payer: Commercial Managed Care - HMO

## 2014-12-06 DIAGNOSIS — R7309 Other abnormal glucose: Secondary | ICD-10-CM

## 2014-12-06 DIAGNOSIS — Z125 Encounter for screening for malignant neoplasm of prostate: Secondary | ICD-10-CM

## 2014-12-06 DIAGNOSIS — R7303 Prediabetes: Secondary | ICD-10-CM

## 2014-12-06 DIAGNOSIS — E785 Hyperlipidemia, unspecified: Secondary | ICD-10-CM

## 2014-12-06 NOTE — Addendum Note (Signed)
Addended by: Baldomero LamyHAVERS, Lyndie Vanderloop C on: 12/06/2014 09:03 AM   Modules accepted: Orders

## 2014-12-07 ENCOUNTER — Encounter (HOSPITAL_COMMUNITY): Payer: Self-pay | Admitting: Cardiology

## 2014-12-11 ENCOUNTER — Other Ambulatory Visit: Payer: Self-pay | Admitting: Family Medicine

## 2014-12-11 MED ORDER — PRAVASTATIN SODIUM 40 MG PO TABS: 40.0000 mg | ORAL_TABLET | Freq: Every day | ORAL | Status: DC

## 2014-12-13 ENCOUNTER — Telehealth: Payer: Self-pay | Admitting: Cardiology

## 2014-12-13 ENCOUNTER — Other Ambulatory Visit: Payer: Self-pay | Admitting: *Deleted

## 2014-12-13 MED ORDER — PRASUGREL HCL 10 MG PO TABS: 10.0000 mg | ORAL_TABLET | Freq: Every day | ORAL | Status: DC

## 2014-12-13 MED ORDER — PRASUGREL HCL 10 MG PO TABS
10.0000 mg | ORAL_TABLET | Freq: Every day | ORAL | Status: DC
Start: 2014-12-13 — End: 2015-01-17

## 2014-12-13 NOTE — Telephone Encounter (Signed)
Pt called in stating that since starting the Clopidogrel he has been extremely tired and he says that it take about 8 hours for him to regain his energy. He also stated that he has been taking his Ranexa hoping to counteract him feeling tired but that has not been successful. Please call  Thanks

## 2014-12-13 NOTE — Telephone Encounter (Signed)
Discussed with Dr. Herbie BaltimoreHarding.  Stop Plavix and restart Effient.  Samples x 3 weeks given.  Patient notified and voiced understanding.

## 2014-12-13 NOTE — Telephone Encounter (Signed)
C/O feeling lethargic since starting Clopidogrel.  Feels like his head is in a fog.  Felt better on Effient.  Has tried plavix in the past and it made him disoriented.  Also feels better when he takes ranexa twice a day but because of the cost he is only taking it once a day. Wants to know should he switch back to effient?   Patient advised will send this info to Dr. Herbie BaltimoreHarding for review and advise.

## 2015-01-17 ENCOUNTER — Telehealth: Payer: Self-pay | Admitting: Cardiology

## 2015-01-17 MED ORDER — PRASUGREL HCL 10 MG PO TABS: 10.0000 mg | ORAL_TABLET | Freq: Every day | ORAL | Status: DC

## 2015-01-17 MED ORDER — RANOLAZINE ER 1000 MG PO TB12: ORAL_TABLET | ORAL | Status: DC

## 2015-01-17 NOTE — Telephone Encounter (Signed)
Ranexa refill sent in to pharmacy. Patient takes once daily, but Rx states BID (r/t cost) so he can get #180 to last 6 months. He inquired about writing his Rx for effient this way and I informed him effient is a once daily medication so this would not work in has favor. Samples provided - #7 effient #28 ranexa

## 2015-01-17 NOTE — Telephone Encounter (Signed)
Please call,question about his Effient.

## 2015-01-18 ENCOUNTER — Other Ambulatory Visit: Payer: Self-pay | Admitting: Cardiology

## 2015-04-19 ENCOUNTER — Telehealth: Payer: Self-pay | Admitting: Cardiology

## 2015-04-19 MED ORDER — PRASUGREL HCL 10 MG PO TABS: 10.0000 mg | ORAL_TABLET | Freq: Every day | ORAL | Status: DC

## 2015-04-19 NOTE — Telephone Encounter (Signed)
Pt informed samples available at front desk. 

## 2015-04-19 NOTE — Telephone Encounter (Signed)
Pt called in wanting some samples of Effient

## 2015-04-30 ENCOUNTER — Ambulatory Visit (INDEPENDENT_AMBULATORY_CARE_PROVIDER_SITE_OTHER): Payer: Commercial Managed Care - HMO | Admitting: Family Medicine

## 2015-04-30 ENCOUNTER — Encounter: Payer: Self-pay | Admitting: Family Medicine

## 2015-04-30 VITALS — BP 130/88 | HR 80 | Temp 98.0°F | Wt 240.0 lb

## 2015-04-30 DIAGNOSIS — M25532 Pain in left wrist: Secondary | ICD-10-CM | POA: Insufficient documentation

## 2015-04-30 NOTE — Progress Notes (Signed)
BP 130/88 mmHg  Pulse 80  Temp(Src) 98 F (36.7 C) (Oral)  Wt 240 lb (108.863 kg)   CC: L wrist pain  Subjective:    Patient ID: Calvin Francis, male    DOB: 1961/04/14, 54 y.o.   MRN: [id number]  HPI: Calvin Francis is a 54 y.o. male presenting on 04/30/2015 for Wrist Pain   L handed.  2 wk h/o L wrist pain. Started after helping son with truck work. Using crowbar and hammer to remove springs. No injury but wonders if jarring motion caused pain. Worse pain with supination at wrist.   Has had debilitating pain. Has been using wrist brace which significantly helped. Has also been using ibuprofen and ben gay. Wrist some better but still tender. Has had injury while bowling in past, but not as bad as currently.   HTN - only on lisinopril 2.5mg  daily. Attributes mildly elevated reading today to nicoderm patch he's using. On recheck improved.  Relevant past medical, surgical, family and social history reviewed and updated as indicated. Interim medical history since our last visit reviewed. Allergies and medications reviewed and updated. Current Outpatient Prescriptions on File Prior to Visit  Medication Sig  . ALPRAZolam (XANAX) 0.25 MG tablet Take 0.25 mg by mouth as needed for sleep.  . APPLE CIDER VINEGAR PO Take by mouth.  . COLCRYS 0.6 MG tablet TAKE AS DIRECTED 2 TABLETS BY MOUTH ON FIRST DAY THEN ONE DAILY AS NEEDED  . hydrOXYzine (ATARAX/VISTARIL) 10 MG tablet Take 10 mg by mouth 3 (three) times daily as needed.  Boris Lown. Krill Oil Omega-3 300 MG CAPS Take 300 mg by mouth daily.  . lansoprazole (PREVACID SOLUTAB) 15 MG disintegrating tablet Take 15 mg by mouth daily at 12 noon.  Marland Kitchen. lisinopril (PRINIVIL,ZESTRIL) 2.5 MG tablet TAKE 1 TABLET EVERY DAY  . nitroGLYCERIN (NITROSTAT) 0.4 MG SL tablet Place 1 tablet (0.4 mg total) under the tongue every 5 (five) minutes as needed for chest pain.  Marland Kitchen. omeprazole (PRILOSEC OTC) 20 MG tablet Take 20 mg by mouth daily.  . prasugrel (EFFIENT) 10 MG  TABS tablet Take 1 tablet (10 mg total) by mouth daily.  . pravastatin (PRAVACHOL) 40 MG tablet Take 1 tablet (40 mg total) by mouth daily.  . ranolazine (RANEXA) 1000 MG SR tablet Take 1 tablet (1,000 mg total) by mouth twice daily, as directed.  . triamcinolone cream (KENALOG) 0.1 % Apply 1 application topically 2 (two) times daily as needed.  . fluorouracil (EFUDEX) 5 % cream Apply topically 2 (two) times daily. (Patient not taking: Reported on 04/30/2015)   No current facility-administered medications on file prior to visit.    Review of Systems Per HPI unless specifically indicated above     Objective:    BP 130/88 mmHg  Pulse 80  Temp(Src) 98 F (36.7 C) (Oral)  Wt 240 lb (108.863 kg)  Wt Readings from Last 3 Encounters:  04/30/15 240 lb (108.863 kg)  12/04/14 233 lb (105.688 kg)  11/21/14 233 lb (105.688 kg)    Physical Exam  Constitutional: He is oriented to person, place, and time. He appears well-developed and well-nourished. No distress.  Musculoskeletal: He exhibits no edema.  R hand WNL L hand - tender at distal medial ulna, no tenderness at thenar or hypothenar eminences, no pain with finkelstein, no pain at carpal bones or at anatomical snuff box  Neurological: He is alert and oriented to person, place, and time. No sensory deficit.  Skin: Skin is warm  and dry. No rash noted.  Psychiatric: He has a normal mood and affect.  Nursing note and vitals reviewed.  Results for orders placed or performed in visit on 11/18/13  Uric acid  Result Value Ref Range   Uric Acid, Serum 7.4 4.0 - 7.8 mg/dL  Basic metabolic panel  Result Value Ref Range   Sodium 133 (L) 135 - 145 mEq/L   Potassium 4.1 3.5 - 5.1 mEq/L   Chloride 101 96 - 112 mEq/L   CO2 25 19 - 32 mEq/L   Glucose, Bld 83 70 - 99 mg/dL   BUN 9 6 - 23 mg/dL   Creatinine, Ser 1.1 0.4 - 1.5 mg/dL   Calcium 9.2 8.4 - 95.6 mg/dL   GFR 21.30 >86.57 mL/min  CBC with Differential  Result Value Ref Range   WBC 7.3  4.5 - 10.5 K/uL   RBC 5.05 4.22 - 5.81 Mil/uL   Hemoglobin 14.4 13.0 - 17.0 g/dL   HCT 84.6 96.2 - 95.2 %   MCV 85.2 78.0 - 100.0 fl   MCHC 33.4 30.0 - 36.0 g/dL   RDW 84.1 32.4 - 40.1 %   Platelets 247.0 150.0 - 400.0 K/uL   Neutrophils Relative % 62.3 43.0 - 77.0 %   Lymphocytes Relative 27.8 12.0 - 46.0 %   Monocytes Relative 5.7 3.0 - 12.0 %   Eosinophils Relative 3.7 0.0 - 5.0 %   Basophils Relative 0.5 0.0 - 3.0 %   Neutro Abs 4.5 1.4 - 7.7 K/uL   Lymphs Abs 2.0 0.7 - 4.0 K/uL   Monocytes Absolute 0.4 0.1 - 1.0 K/uL   Eosinophils Absolute 0.3 0.0 - 0.7 K/uL   Basophils Absolute 0.0 0.0 - 0.1 K/uL      Assessment & Plan:   Problem List Items Addressed This Visit    Left wrist pain - Primary    Anticipate ECU tendonitis vs TFCC injury, slowly improving. rec continued wrist brace he is currently using, continue ice, avoid NSAIDs given cardiac history, use tylenol for pain. Pt declined steroid course. Exercises for TFCC injury provided today. Update if not improving with treatment, consider xray.          Follow up plan: Return if symptoms worsen or fail to improve.

## 2015-04-30 NOTE — Patient Instructions (Signed)
Do plantar fasciitis stretching exercises. For wrist - I think you have either ligament strain or tendinopathy. Do exercises provided today, use tylenol 500mg  three times daily as needed, continue regular use of wrist brace. Let us know if not improving with this.

## 2015-04-30 NOTE — Progress Notes (Signed)
Pre visit review using our clinic review tool, if applicable. No additional management support is needed unless otherwise documented below in the visit note. 

## 2015-04-30 NOTE — Assessment & Plan Note (Signed)
Anticipate ECU tendonitis vs TFCC injury, slowly improving. rec continued wrist brace he is currently using, continue ice, avoid NSAIDs given cardiac history, use tylenol for pain. Pt declined steroid course. Exercises for TFCC injury provided today. Update if not improving with treatment, consider xray.

## 2015-05-11 ENCOUNTER — Telehealth: Payer: Self-pay | Admitting: Cardiology

## 2015-05-11 NOTE — Telephone Encounter (Signed)
Medication samples have been provided to the patient.  Drug name: effient 10mg   Qty: 7  LOT: [id number]  Exp.Date: 12/2015      Qty: 21 LOT: [id number]  Exp.Date: 07/2015 Drug name: ranexa 1000mg   Qty: 28 LOT: [id number]  Exp.Date: 12/2017  Samples left at front desk for patient pick-up. Patient notified.  Julaine Fusilkins, Amandine Covino M 11:26 AM 05/11/2015

## 2015-05-11 NOTE — Telephone Encounter (Signed)
Pt would like some samples of Effient please. °

## 2015-06-15 ENCOUNTER — Telehealth: Payer: Self-pay | Admitting: Cardiology

## 2015-06-15 MED ORDER — PRASUGREL HCL 10 MG PO TABS: 10.0000 mg | ORAL_TABLET | Freq: Every day | ORAL | Status: DC

## 2015-06-15 NOTE — Telephone Encounter (Signed)
Samples at front desk, pt notified via phone call - voiced acknowledgement.

## 2015-06-15 NOTE — Telephone Encounter (Signed)
Pt would like some samples of Effient please. °

## 2015-06-26 ENCOUNTER — Ambulatory Visit (INDEPENDENT_AMBULATORY_CARE_PROVIDER_SITE_OTHER): Payer: Commercial Managed Care - HMO | Admitting: Cardiology

## 2015-06-26 ENCOUNTER — Encounter: Payer: Self-pay | Admitting: Cardiology

## 2015-06-26 VITALS — BP 134/100 | HR 79 | Ht 74.0 in | Wt 239.0 lb

## 2015-06-26 DIAGNOSIS — I25708 Atherosclerosis of coronary artery bypass graft(s), unspecified, with other forms of angina pectoris: Secondary | ICD-10-CM | POA: Diagnosis not present

## 2015-06-26 DIAGNOSIS — Z789 Other specified health status: Secondary | ICD-10-CM

## 2015-06-26 DIAGNOSIS — I255 Ischemic cardiomyopathy: Secondary | ICD-10-CM | POA: Diagnosis not present

## 2015-06-26 DIAGNOSIS — F17200 Nicotine dependence, unspecified, uncomplicated: Secondary | ICD-10-CM

## 2015-06-26 DIAGNOSIS — I25119 Atherosclerotic heart disease of native coronary artery with unspecified angina pectoris: Secondary | ICD-10-CM

## 2015-06-26 DIAGNOSIS — I10 Essential (primary) hypertension: Secondary | ICD-10-CM | POA: Diagnosis not present

## 2015-06-26 DIAGNOSIS — E785 Hyperlipidemia, unspecified: Secondary | ICD-10-CM

## 2015-06-26 DIAGNOSIS — I208 Other forms of angina pectoris: Secondary | ICD-10-CM

## 2015-06-26 MED ORDER — ISOSORBIDE DINITRATE 20 MG PO TABS: 20.0000 mg | ORAL_TABLET | Freq: Two times a day (BID) | ORAL | Status: DC

## 2015-06-26 MED ORDER — RANOLAZINE ER 1000 MG PO TB12: ORAL_TABLET | ORAL | Status: DC

## 2015-06-26 MED ORDER — PRASUGREL HCL 10 MG PO TABS: 10.0000 mg | ORAL_TABLET | Freq: Every day | ORAL | Status: DC

## 2015-06-26 NOTE — Progress Notes (Signed)
PCP: Ria Bush, MD  Clinic Note: Chief Complaint  Patient presents with  . Follow-up    38monthfollowup; frequently has chest pain-last longer, having 3 times a week, occassional shortness of breath, no edema, has pain in legs, has cramping in legs, frequently has lightheadedness, frequently has dizziness  . Coronary Artery Disease  . Chest Pain    Stable angina    HPI: Calvin EBELis a 54y.o. male with a PMH below who presents today for 6 month f/u of CAD with Stable Exdertional Angina..  He was a former patient of Dr. AChase Picket who I first met back in December 2011 when he presented with a Non-STEMI w/ staged PCI of SVG-OM1 with a BMS. He has had several catheterizations and several stress tests since then:  occluded SVG-OM1 prior to the stent,   occluded SVG-RCA with occluded native RCA.   Patent LIMA-LAD with a distal LAD 70% lesion not amenable to PCI  Patent Radial Graft to OM2  An attempt was made to intervene on the native circumflex which was unsuccessful. His lipid control is very poor because of statin and other medication intolerance.  Last seen Dec 2015.  Past Medical History  Diagnosis Date  . History of Non-STEMI (non-ST elevated myocardial infarction) 02/22/2004    Three-vessel disease --> referred for CABG  . 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 --> CABG x 4  . S/P CABG x 02 February 2004    LIMA-LAD, SVG to OM 1, SVG to RCA,fRad-OM2  . Abnormal nuclear stress test August 2005    Occluded SVG-RCA  . History of Non-STEMI (non-ST elevated myocardial infarction) December 2011    Hazy lesion in SVG-OM1 --> staged PCI with 4.0 mm x 20 mm vision BMS (4.5 mm)  . CAD (coronary artery disease) of bypass graft May 2012; December 2013    Cath for angina and abnormal stress test: SVG-OM1 now occluded, attempt at PCI to the native  circumflex OM1 unsuccessful; distal LAD beyond patent LIMA 70-80% (not PCI amenable) free radial-OM 2 patent; EF 45-50%  . Stable angina 11/2012    Chronic,some what stable but still present; cardiac cath results above, no significant change from 2012  . S/P most recent cardiac catheterization December 2013    severe; now nonrevascularizable, h/o MI x3 s/p CABG and PCI (native and bypass CAD), SVG-RCA & SVG-OM1 100%; patent LIMA -LAD (severe ~70-80% distal LAD disease after LIMA) & Free Radial OM2   . Ischemic cardiomyopathy Echo 10/2012    EF ~40%; moderate Posterior HypoKinesis, minld-moderate inferior Hypokinesis; mild RV dilation, mild concentric LVH  . HLD (hyperlipidemia)     Statin intolerant  . GERD (gastroesophageal reflux disease)     ?barrett's, with esophageal dysmotility, on omeprazole  . Barrett's esophagus     s/p EGD several years ago with [facility]  . Varicose veins December 2013    with venous reflux status VNUS ablation of left greater saphenous wein  . Hypertension, benign   . Former heavy cigarette smoker (20-39 per day) May 2013   . Glucose intolerance (impaired glucose tolerance)  2009    Prediabetes  . Erectile dysfunction   . Testosterone deficiency     On replacement; goal is low normal.  . Gout   . Bronchitis, mucopurulent recurrent     Prior Cardiac Evaluation and Past Surgical History: Past  Surgical History  Procedure Laterality Date  . Vein surgery  2011    solomon - h/o vericose veins  . Esophagogastroduodenoscopy    . US echocardiography  09/2012    mild LVH, EF 40%, mod posterior and inf wall hypokinesis,mild concentric LVH;; RV dilated, normal  fx.trace MR  . Coronary artery bypass graft  02/27/04    LIMA -LAD, Free Radial-OM2 -- patent; SVG-OM1, SVG-RCA, - 100% occluded by recent cath  . Met/cpet  11/02/2012    suboptimal effort but peak VO2  was 52% which relatively significant. Myoview was suggestive for ant. ischemia may go along with his distal  LAD   . Lower venous extremity doppler Left 08/15/2011    normal ,s/p vein ablation  . Nm myocar perf wall motion  12/15/2012    high risk study  . Coronary angioplasty with stent placement  12/25/2010    s/p autologous vessel blockage  . Cardiac catheterization  May 01 2011    knowwn occlusion of SVG to RCA  as well as native RCA ; PCI to the SVG to OM1 ;patent LIMA to LAD with diffuse distal  LAD 70-80%,small  vessel dx not thought to be amenable to PCI .RCA 100% occluded ,OM2 patent,Circ diseased aftr OM1. EF 45-50%  . Cardiac catheterization  12/27/2012  . Left heart catheterization with coronary/graft angiogram N/A 12/27/2012    Procedure: LEFT HEART CATHETERIZATION WITH Beatrix Fetters;  Surgeon: Leonie Man, MD;  Location: [facility] CATH LAB;  Service: Cardiovascular;  Laterality: N/A;    Interval History: he was date: Feeling essentially global about the same as he did last time. He has about 2-3 episodes a week of chest tightness and pressure that is concerning for angina usually with mild to moderate exertion. He can do much more than a flight of stairs or a brisk walk around the house without having some anginal symptoms.  He is however to try to pick up his level exercise and is able to do more than he had been. He just generally feels Tired with not as much energy as he would hope. This is pretty stable. He does little exertional dyspnea but denies any real heart failure symptoms of PND, orthopnea or edema. No palpitations, lightheadedness, dizziness, weakness or syncope/near syncope. No TIA/amaurosis fugax symptoms. No melena, hematochezia, hematuria, or epstaxis. No claudication.  ROS: A comprehensive was performed. Review of Systems  HENT: Negative for nosebleeds.   Respiratory: Negative for cough and wheezing.   Cardiovascular: Negative for claudication and leg swelling.       Per history of present illness  Musculoskeletal: Positive for joint pain. Negative for falls.   Neurological: Positive for dizziness (Occasionally with position change). Negative for weakness and headaches.  Endo/Heme/Allergies: Bruises/bleeds easily.  Psychiatric/Behavioral: The patient is nervous/anxious.   All other systems reviewed and are negative.   Current Outpatient Prescriptions on File Prior to Visit  Medication Sig Dispense Refill  . ALPRAZolam (XANAX) 0.25 MG tablet Take 0.25 mg by mouth as needed for sleep.    . APPLE CIDER VINEGAR PO Take by mouth.    . COLCRYS 0.6 MG tablet TAKE AS DIRECTED 2 TABLETS BY MOUTH ON FIRST DAY THEN ONE DAILY AS NEEDED 30 tablet 0  . fluorouracil (EFUDEX) 5 % cream Apply topically 2 (two) times daily. 40 g 1  . hydrOXYzine (ATARAX/VISTARIL) 10 MG tablet Take 10 mg by mouth 3 (three) times daily as needed.    [name] Oil Omega-3 300 MG CAPS Take 300  mg by mouth daily.    . lansoprazole (PREVACID SOLUTAB) 15 MG disintegrating tablet Take 15 mg by mouth daily at 12 noon.    Marland Kitchen lisinopril (PRINIVIL,ZESTRIL) 2.5 MG tablet TAKE 1 TABLET EVERY DAY 90 tablet 3  . nitroGLYCERIN (NITROSTAT) 0.4 MG SL tablet Place 1 tablet (0.4 mg total) under the tongue every 5 (five) minutes as needed for chest pain. 25 tablet 3  . omeprazole (PRILOSEC OTC) 20 MG tablet Take 20 mg by mouth daily.    . prasugrel (EFFIENT) 10 MG TABS tablet Take 1 tablet (10 mg total) by mouth daily. 21 tablet 0  . pravastatin (PRAVACHOL) 40 MG tablet Take 1 tablet (40 mg total) by mouth daily. 30 tablet 6  . triamcinolone cream (KENALOG) 0.1 % Apply 1 application topically 2 (two) times daily as needed.     No current facility-administered medications on file prior to visit.   Allergies  Allergen Reactions  . Clopidogrel Other (See Comments)    Disoriented   . Statins   . Zetia [Ezetimibe]   . Codeine Rash     History  Substance Use Topics  . Smoking status: Light Tobacco Smoker    Types: Cigarettes    Last Attempt to Quit: 04/28/2012  . Smokeless tobacco: Never Used      Comment: long time smoker, smokes occasional -anxiety  . Alcohol Use: Yes     Comment: occasionally   Family History  Problem Relation Age of Onset  . Diabetes Paternal Aunt   . Coronary artery disease Paternal Uncle   . Stroke Maternal Uncle   . Cancer Neg Hx     Wt Readings from Last 3 Encounters:  06/26/15 108.41 kg (239 lb)  04/30/15 108.863 kg (240 lb)  12/04/14 105.688 kg (233 lb)    PHYSICAL EXAM BP 134/100 mmHg  Pulse 79  Ht $R'6\' 2"'OK$  (1.88 m)  Wt 108.41 kg (239 lb)  BMI 30.67 kg/m2 General appearance: alert, cooperative, appears stated age, no distress and mildly obese; pleasant mood and affect HEENT: [place]/AT, EOMI, MMM, anicteric sclera Neck: no adenopathy, no carotid bruit and no JVD Lungs: clear to auscultation bilaterally, normal percussion bilaterally and non-labored Heart: RRR, normal S1/S2, no M./R./G. Nondisplaced PMI. Abdomen: soft, non-tender; bowel sounds normal; no masses,  no organomegaly; no HJR Extremities: extremities normal, atraumatic, no cyanosis, and edema  Pulses: 2+ and symmetric;  Neurologic: Mental status: Alert, oriented, thought content appropriate Cranial nerves: normal (II-XII grossly intact)    Adult ECG Report  Rate: 79 ;  Rhythm: normal sinus rhythm and Otherwise normal axis, intervals and durations.  Narrative Interpretation: essentially normal EKG. Stable.   Other studies Reviewed: n/a Additional studies/ records that were reviewed today include:  Review of the above records demonstrates:  Recent Labs:  Recent labs not available.   ASSESSMENT / PLAN: Problem List Items Addressed This Visit    Atherosclerotic heart disease of native coronary artery with angina pectoris (Chronic)    Hopefully we can figure out a way to help him out as far as a donut hole situation so he can get her next in 1000 mg twice a day. However for now more limited to 500 mg twice a day with the addition of Isordil. Hopefully will tolerate. On stable  low-dose ACE inhibitor which we may be able to increase if his pressures remain stable. On beta blocker due to his concerns of fatigue. Bystolic would be an option, but would just after putting him into the doughnut hole.  We tried in the past to use Plavix, he was unable tolerate that with neurologic issues. He does a lot of stents and for protective purposes would like to keep him on some antiplatelet agent. He at this time we can convert his Effient dosing interval into every other day.      Relevant Medications   isosorbide dinitrate (ISORDIL) 20 MG tablet   ranolazine (RANEXA) 1000 MG SR tablet   Other Relevant Orders   EKG 12-Lead (Completed)   CAD (coronary artery disease) of bypass graft (Chronic)    Long-standing graft disease. Probably consider stress testing either next year or the following year to evaluate for further ischemia. I think his current symptoms are probably microvascular in nature from simply inadequate collateral flow.      Relevant Medications   isosorbide dinitrate (ISORDIL) 20 MG tablet   ranolazine (RANEXA) 1000 MG SR tablet   Essential hypertension - Primary (Chronic)    Problem with dizziness last this is precluded the increase dosing of lisinopril. Consider trying to re-titrate at next visit.      Relevant Medications   isosorbide dinitrate (ISORDIL) 20 MG tablet   ranolazine (RANEXA) 1000 MG SR tablet   Other Relevant Orders   EKG 12-Lead (Completed)   Hyperlipidemia with target LDL less than 70 with statin intolerance;  (Chronic)    Currently taking pravastatin and Krill Oil. I don't have recent lipid levels from him.he has been intolerant of statins as well as Zetia and fenofibrate. Again financial issues raise her head as far as consideration for PCSK9 inhibitor therapy. Will recheck lipid panel prior to next visit.      Relevant Medications   isosorbide dinitrate (ISORDIL) 20 MG tablet   ranolazine (RANEXA) 1000 MG SR tablet   Other Relevant  Orders   EKG 12-Lead (Completed)   Ischemic cardiomyopathy (Chronic)    Again borderline EF for ICD recommendation. No real heart failure symptoms besides fatigue. Intolerant of digoxin and beta blockers due to bradycardia/fatigue. Currently tolerating low dose ACE inhibitor - consider increasing with next dose. Again he would be a great candidate for Entresto or Corlanor - but with his financial issues, he wouldn't be able to afford his meds.  No diuretic requirement with no PND or orthopnea.      Relevant Medications   isosorbide dinitrate (ISORDIL) 20 MG tablet   ranolazine (RANEXA) 1000 MG SR tablet   Other Relevant Orders   EKG 12-Lead (Completed)   Smoker    Currently only smokes less than 5 cigarettes on any given day. This seems to be one of his crutches for anxiety. We talked again about fully stopping. He seems to think that he should be able to stop fully.      Stable angina: Class I-II (Chronic)    Pretty much stable as he can get from an angina standpoint. He did much better when he was able to actually take your next at 1000 mg twice a day. Unfortunately he's having a hard time being and to afford the medication. We have compromised that she is taking 500 mg twice a day. In the In the past he didn't do well with Imdur. I'm going to switch him to Isordil at 20 mg twice a day that he will take for a short course after an episode when he has to take when necessary nitroglycerin.  Continued to try to Increase activity level.      Relevant Medications   isosorbide dinitrate (ISORDIL) 20 MG tablet  ranolazine (RANEXA) 1000 MG SR tablet   Statin intolerance (Chronic)      Current medicines are reviewed at length with the patient today. (+/- concerns) he is concerned about the cost of the Ranexa and Effient. The following changes have been made:   TAKE ISOSORBIDE DN 20 MG TWICE A DAY ( 12 HOUR A PART)  AFTER YOU HAVE EPISODE WITH CHEST DISCOMFORT  ,TAKE 6 DOSES TAKE  TYLENOL   BEFORE USING ISOSORBIDE.    CAN TAKE EFFIENT  EVERY OTHER DAY.  KEEP ALL OTHER MEDICATION THE SAME.  Your physician wants you to follow-up in:    [place]. 30 MIN APPOINTMENT.   labs/ tests ordered today include:   Orders Placed This Encounter  Procedures  . EKG 12-Lead   Meds ordered this encounter  Medications  . isosorbide dinitrate (ISORDIL) 20 MG tablet    Sig: Take 1 tablet (20 mg total) by mouth 2 (two) times daily.    Dispense:  60 tablet    Refill:  11  . prasugrel (EFFIENT) 10 MG TABS tablet    Sig: Take 1 tablet (10 mg total) by mouth daily.    Dispense:  7 tablet    Refill:  0    Lot [id number]   01/2016 X 1  . ranolazine (RANEXA) 1000 MG SR tablet    Sig: Take 1 tablet (1,000 mg total) by mouth twice daily, as directed.    Dispense:  28 tablet    Refill:  0    LOT [id number] Exp: 4/19 X4     HARDING, Leonie Green, M.D., M.S. Interventional Cardiologist   Pager # [phone]

## 2015-06-26 NOTE — Patient Instructions (Addendum)
TAKE ISOSORBIDE DN 20 MG TWICE A DAY ( 12 HOUR A PART)  AFTER YOU HAVE EPISODE WITH CHEST DISCOMFORT  ,TAKE 6 DOSES TAKE TYLENOL   BEFORE USING ISOSORBIDE.    CAN TAKE EFFIENT  EVERY OTHER DAY.  KEEP ALL OTHER MEDICATION THE SAME.  Your physician wants you to follow-up in:    6 MONTH DR HARDING. 30 MIN APPOINTMENT. You will receive a reminder letter in the mail two months in advance. If you don't receive a letter, please call our office to schedule the follow-up appointment.

## 2015-06-28 ENCOUNTER — Encounter: Payer: Self-pay | Admitting: Cardiology

## 2015-06-28 NOTE — Assessment & Plan Note (Signed)
Again borderline EF for ICD recommendation. No real heart failure symptoms besides fatigue. Intolerant of digoxin and beta blockers due to bradycardia/fatigue. Currently tolerating low dose ACE inhibitor - consider increasing with next dose. Again he would be a great candidate for Entresto or Corlanor - but with his financial issues, he wouldn't be able to afford his meds.  No diuretic requirement with no PND or orthopnea.

## 2015-06-28 NOTE — Assessment & Plan Note (Signed)
Problem with dizziness last this is precluded the increase dosing of lisinopril. Consider trying to re-titrate at next visit.

## 2015-06-28 NOTE — Assessment & Plan Note (Signed)
Pretty much stable as he can get from an angina standpoint. He did much better when he was able to actually take your next at 1000 mg twice a day. Unfortunately he's having a hard time being and to afford the medication. We have compromised that she is taking 500 mg twice a day. In the In the past he didn't do well with Imdur. I'm going to switch him to Isordil at 20 mg twice a day that he will take for a short course after an episode when he has to take when necessary nitroglycerin.  Continued to try to Increase activity level.

## 2015-06-28 NOTE — Assessment & Plan Note (Signed)
Long-standing graft disease. Probably consider stress testing either next year or the following year to evaluate for further ischemia. I think his current symptoms are probably microvascular in nature from simply inadequate collateral flow.

## 2015-06-28 NOTE — Assessment & Plan Note (Signed)
Hopefully we can figure out a way to help him out as far as a donut hole situation so he can get her next in 1000 mg twice a day. However for now more limited to 500 mg twice a day with the addition of Isordil. Hopefully will tolerate. On stable low-dose ACE inhibitor which we may be able to increase if his pressures remain stable. On beta blocker due to his concerns of fatigue. Bystolic would be an option, but would just after putting him into the doughnut hole. We tried in the past to use Plavix, he was unable tolerate that with neurologic issues. He does a lot of stents and for protective purposes would like to keep him on some antiplatelet agent. He at this time we can convert his Effient dosing interval into every other day.

## 2015-06-28 NOTE — Assessment & Plan Note (Signed)
Currently only smokes less than 5 cigarettes on any given day. This seems to be one of his crutches for anxiety. We talked again about fully stopping. He seems to think that he should be able to stop fully.

## 2015-06-28 NOTE — Assessment & Plan Note (Signed)
Currently taking pravastatin and Krill Oil. I don't have recent lipid levels from him.he has been intolerant of statins as well as Zetia and fenofibrate. Again financial issues raise her head as far as consideration for PCSK9 inhibitor therapy. Will recheck lipid panel prior to next visit.

## 2015-07-12 ENCOUNTER — Encounter: Payer: Self-pay | Admitting: Cardiology

## 2015-07-19 ENCOUNTER — Telehealth: Payer: Self-pay | Admitting: Cardiology

## 2015-07-19 NOTE — Telephone Encounter (Signed)
Medication samples have been provided to the patient.  Drug name: ranexa 1000  Qty: 56  LOT: [id number]  Exp.Date: 03/2018           effient 10  Qty: 28  LOT: [id number]  Exp.Date: 01/2016  Samples left at front desk for patient pick-up. Patient notified.  Julaine Fusi M 2:44 PM 07/19/2015

## 2015-07-19 NOTE — Telephone Encounter (Signed)
Mr.Grob is calling to see if he cant samples of Ranexa or Effient . Please call   Thanks

## 2015-09-04 ENCOUNTER — Telehealth: Payer: Self-pay | Admitting: Cardiology

## 2015-09-04 MED ORDER — RANOLAZINE ER 1000 MG PO TB12: ORAL_TABLET | ORAL | Status: DC

## 2015-09-04 MED ORDER — PRASUGREL HCL 10 MG PO TABS: 10.0000 mg | ORAL_TABLET | Freq: Every day | ORAL | Status: DC

## 2015-09-04 NOTE — Telephone Encounter (Signed)
Pt informed samples available for pickup at front desk - he voiced understanding.

## 2015-09-04 NOTE — Telephone Encounter (Signed)
Patient calling the office for samples of medication:   1.  What medication and dosage are you requesting samples for? Effient and Ranexa  2.  Are you currently out of this medication? A week left  3. Are you requesting samples to get you through until a mail order prescription arrives? no

## 2015-09-26 ENCOUNTER — Ambulatory Visit (INDEPENDENT_AMBULATORY_CARE_PROVIDER_SITE_OTHER): Payer: Commercial Managed Care - HMO

## 2015-09-26 DIAGNOSIS — Z23 Encounter for immunization: Secondary | ICD-10-CM | POA: Diagnosis not present

## 2015-10-25 ENCOUNTER — Telehealth: Payer: Self-pay | Admitting: Cardiology

## 2015-10-25 NOTE — Telephone Encounter (Signed)
Patient calling the office for samples of medication:   1.  What medication and dosage are you requesting samples for? Ranexa and Effient   2.  Are you currently out of this medication? Yes   3. Are you requesting samples to get you through until you receive your prescription? No

## 2015-10-26 MED ORDER — PRASUGREL HCL 10 MG PO TABS: 10.0000 mg | ORAL_TABLET | Freq: Every day | ORAL | Status: DC

## 2015-10-26 NOTE — Telephone Encounter (Signed)
Returned call to patient effient 10 mg and ranexa 1000 mg samples left at front desk of [facility] office.

## 2015-10-29 LAB — FECAL OCCULT BLOOD, GUAIAC: FECAL OCCULT BLD: NEGATIVE

## 2015-11-24 ENCOUNTER — Encounter (HOSPITAL_COMMUNITY): Payer: Self-pay | Admitting: Emergency Medicine

## 2015-11-24 ENCOUNTER — Emergency Department (HOSPITAL_COMMUNITY)
Admission: EM | Admit: 2015-11-24 | Discharge: 2015-11-24 | Disposition: A | Discharge status: Home / Self Care | Payer: Commercial Managed Care - HMO | Source: Home / Self Care | Attending: Emergency Medicine | Admitting: Emergency Medicine

## 2015-11-24 DIAGNOSIS — J9801 Acute bronchospasm: Secondary | ICD-10-CM | POA: Diagnosis not present

## 2015-11-24 DIAGNOSIS — Z720 Tobacco use: Secondary | ICD-10-CM

## 2015-11-24 MED ORDER — TRIAMCINOLONE ACETONIDE 40 MG/ML IJ SUSP
INTRAMUSCULAR | Status: AC
  Filled 2015-11-24: qty 1

## 2015-11-24 MED ORDER — PREDNISONE 20 MG PO TABS: ORAL_TABLET | ORAL | Status: DC

## 2015-11-24 MED ORDER — IPRATROPIUM-ALBUTEROL 0.5-2.5 (3) MG/3ML IN SOLN
3.0000 mL | Freq: Once | RESPIRATORY_TRACT | Status: AC
  Administered 2015-11-24: 3 mL via RESPIRATORY_TRACT

## 2015-11-24 MED ORDER — IPRATROPIUM-ALBUTEROL 0.5-2.5 (3) MG/3ML IN SOLN
RESPIRATORY_TRACT | Status: AC
  Filled 2015-11-24: qty 3

## 2015-11-24 MED ORDER — TRIAMCINOLONE ACETONIDE 40 MG/ML IJ SUSP
40.0000 mg | Freq: Once | INTRAMUSCULAR | Status: AC
  Administered 2015-11-24: 40 mg via INTRAMUSCULAR

## 2015-11-24 MED ORDER — ALBUTEROL SULFATE HFA 108 (90 BASE) MCG/ACT IN AERS: 2.0000 | INHALATION_SPRAY | RESPIRATORY_TRACT | Status: DC | PRN

## 2015-11-24 NOTE — Discharge Instructions (Signed)
Bronchospasm, Adult A bronchospasm is a spasm or tightening of the airways going into the lungs. During a bronchospasm breathing becomes more difficult because the airways get smaller. When this happens there can be coughing, a whistling sound when breathing (wheezing), and difficulty breathing. Bronchospasm is often associated with asthma, but not all patients who experience a bronchospasm have asthma. CAUSES  A bronchospasm is caused by inflammation or irritation of the airways. The inflammation or irritation may be triggered by:   Allergies (such as to animals, pollen, food, or mold). Allergens that cause bronchospasm may cause wheezing immediately after exposure or many hours later.   Infection. Viral infections are believed to be the most common cause of bronchospasm.   Exercise.   Irritants (such as pollution, cigarette smoke, strong odors, aerosol sprays, and paint fumes).   Weather changes. Winds increase molds and pollens in the air. Rain refreshes the air by washing irritants out. Cold air may cause inflammation.   Stress and emotional upset.  SIGNS AND SYMPTOMS   Wheezing.   Excessive nighttime coughing.   Frequent or severe coughing with a simple cold.   Chest tightness.   Shortness of breath.  DIAGNOSIS  Bronchospasm is usually diagnosed through a history and physical exam. Tests, such as chest X-rays, are sometimes done to look for other conditions. TREATMENT   Inhaled medicines can be given to open up your airways and help you breathe. The medicines can be given using either an inhaler or a nebulizer machine.  Corticosteroid medicines may be given for severe bronchospasm, usually when it is associated with asthma. HOME CARE INSTRUCTIONS   Always have a plan prepared for seeking medical care. Know when to call your health care provider and local emergency services (911 in the [place] Know where you can access local emergency care.  Only take medicines as  directed by your health care provider.  If you were prescribed an inhaler or nebulizer machine, ask your health care provider to explain how to use it correctly. Always use a spacer with your inhaler if you were given one.  It is necessary to remain calm during an attack. Try to relax and breathe more slowly.  Control your home environment in the following ways:   Change your heating and air conditioning filter at least once a month.   Limit your use of fireplaces and wood stoves.  Do not smoke and do not allow smoking in your home.   Avoid exposure to perfumes and fragrances.   Get rid of pests (such as roaches and mice) and their droppings.   Throw away plants if you see mold on them.   Keep your house clean and dust free.   Replace carpet with wood, tile, or vinyl flooring. Carpet can trap dander and dust.   Use allergy-proof pillows, mattress covers, and box spring covers.   Wash bed sheets and blankets every week in hot water and dry them in a dryer.   Use blankets that are made of polyester or cotton.   Wash hands frequently. SEEK MEDICAL CARE IF:   You have muscle aches.   You have chest pain.   The sputum changes from clear or white to yellow, green, gray, or bloody.   The sputum you cough up gets thicker.   There are problems that may be related to the medicine you are given, such as a rash, itching, swelling, or trouble breathing.  SEEK IMMEDIATE MEDICAL CARE IF:   You have worsening wheezing and coughing  even after taking your prescribed medicines.   You have increased difficulty breathing.   You develop severe chest pain. MAKE SURE YOU:   Understand these instructions.  Will watch your condition.  Will get help right away if you are not doing well or get worse.   This information is not intended to replace advice given to you by your health care provider. Make sure you discuss any questions you have with your health care  provider.   Document Released: 12/18/2003 Document Revised: 01/05/2015 Document Reviewed: 06/06/2013 Elsevier Interactive Patient Education 2016 [employer].  How to Use an Inhaler Using your inhaler correctly is very important. Good technique will make sure that the medicine reaches your lungs.  HOW TO USE AN INHALER:  Take the cap off the inhaler.  If this is the first time using your inhaler, you need to prime it. Shake the inhaler for 5 seconds. Release four puffs into the air, away from your face. Ask your doctor for help if you have questions.  Shake the inhaler for 5 seconds.  Turn the inhaler so the bottle is above the mouthpiece.  Put your pointer finger on top of the bottle. Your thumb holds the bottom of the inhaler.  Open your mouth.  Either hold the inhaler away from your mouth (the width of 2 fingers) or place your lips tightly around the mouthpiece. Ask your doctor which way to use your inhaler.  Breathe out as much air as possible.  Breathe in and push down on the bottle 1 time to release the medicine. You will feel the medicine go in your mouth and throat.  Continue to take a deep breath in very slowly. Try to fill your lungs.  After you have breathed in completely, hold your breath for 10 seconds. This will help the medicine to settle in your lungs. If you cannot hold your breath for 10 seconds, hold it for as long as you can before you breathe out.  Breathe out slowly, through pursed lips. Whistling is an example of pursed lips.  If your doctor has told you to take more than 1 puff, wait at least 15-30 seconds between puffs. This will help you get the best results from your medicine. Do not use the inhaler more than your doctor tells you to.  Put the cap back on the inhaler.  Follow the directions from your doctor or from the inhaler package about cleaning the inhaler. If you use more than one inhaler, ask your doctor which inhalers to use and what order to  use them in. Ask your doctor to help you figure out when you will need to refill your inhaler.  If you use a steroid inhaler, always rinse your mouth with water after your last puff, gargle and spit out the water. Do not swallow the water. GET HELP IF:  The inhaler medicine only partially helps to stop wheezing or shortness of breath.  You are having trouble using your inhaler.  You have some increase in thick spit (phlegm). GET HELP RIGHT AWAY IF:  The inhaler medicine does not help your wheezing or shortness of breath or you have tightness in your chest.  You have dizziness, headaches, or fast heart rate.  You have chills, fever, or night sweats.  You have a large increase of thick spit, or your thick spit is bloody. MAKE SURE YOU:   Understand these instructions.  Will watch your condition.  Will get help right away if you are not  doing well or get worse.   This information is not intended to replace advice given to you by your health care provider. Make sure you discuss any questions you have with your health care provider.   Document Released: 09/23/2008 Document Revised: 10/05/2013 Document Reviewed: 07/14/2013 Elsevier Interactive Patient Education 2016 [employer]  Smoking Hazards Smoking cigarettes is extremely bad for your health. Tobacco smoke has over 200 known poisons in it. It contains the poisonous gases nitrogen oxide and carbon monoxide. There are over 60 chemicals in tobacco smoke that cause cancer. Some of the chemicals found in cigarette smoke include:   Cyanide.   Benzene.   Formaldehyde.   Methanol (wood alcohol).   Acetylene (fuel used in welding torches).   Ammonia.  Even smoking lightly shortens your life expectancy by several years. You can greatly reduce the risk of medical problems for you and your family by stopping now. Smoking is the most preventable cause of death and disease in our society. Within days of quitting smoking, your  circulation improves, you decrease the risk of having a heart attack, and your lung capacity improves. There may be some increased phlegm in the first few days after quitting, and it may take months for your lungs to clear up completely. Quitting for 10 years reduces your risk of developing lung cancer to almost that of a nonsmoker.  WHAT ARE THE RISKS OF SMOKING? Cigarette smokers have an increased risk of many serious medical problems, including:  Lung cancer.   Lung disease (such as pneumonia, bronchitis, and emphysema).   Heart attack and chest pain due to the heart not getting enough oxygen (angina).   Heart disease and peripheral blood vessel disease.   Hypertension.   Stroke.   Oral cancer (cancer of the lip, mouth, or voice box).   Bladder cancer.   Pancreatic cancer.   Cervical cancer.   Pregnancy complications, including premature birth.   Stillbirths and smaller newborn babies, birth defects, and genetic damage to sperm.   Early menopause.   Lower estrogen level for women.   Infertility.   Facial wrinkles.   Blindness.   Increased risk of broken bones (fractures).   Senile dementia.   Stomach ulcers and internal bleeding.   Delayed wound healing and increased risk of complications during surgery. Because of secondhand smoke exposure, children of smokers have an increased risk of the following:   Sudden infant death syndrome (SIDS).   Respiratory infections.   Lung cancer.   Heart disease.   Ear infections.  WHY IS SMOKING ADDICTIVE? Nicotine is the chemical agent in tobacco that is capable of causing addiction or dependence. When you smoke and inhale, nicotine is absorbed rapidly into the bloodstream through your lungs. Both inhaled and noninhaled nicotine may be addictive.  WHAT ARE THE BENEFITS OF QUITTING?  There are many health benefits to quitting smoking. Some are:   The likelihood of developing cancer and heart  disease decreases. Health improvements are seen almost immediately.   Blood pressure, pulse rate, and breathing patterns start returning to normal soon after quitting.   People who quit may see an improvement in their overall quality of life.  HOW DO YOU QUIT SMOKING? Smoking is an addiction with both physical and psychological effects, and longtime habits can be hard to change. Your health care provider can recommend:  Programs and community resources, which may include group support, education, or therapy.  Replacement products, such as patches, gum, and nasal sprays. Use these products only  as directed. Do not replace cigarette smoking with electronic cigarettes (commonly called e-cigarettes). The safety of e-cigarettes is unknown, and some may contain harmful chemicals. FOR MORE INFORMATION  American Lung Association: www.lung.org  American Cancer Society: www.cancer.org   This information is not intended to replace advice given to you by your health care provider. Make sure you discuss any questions you have with your health care provider.   Document Released: 01/22/2005 Document Revised: 10/05/2013 Document Reviewed: 06/06/2013 Elsevier Interactive Patient Education 2016 [employer]  Smoking Cessation, Tips for Success If you are ready to quit smoking, congratulations! You have chosen to help yourself be healthier. Cigarettes bring nicotine, tar, carbon monoxide, and other irritants into your body. Your lungs, heart, and blood vessels will be able to work better without these poisons. There are many different ways to quit smoking. Nicotine gum, nicotine patches, a nicotine inhaler, or nicotine nasal spray can help with physical craving. Hypnosis, support groups, and medicines help break the habit of smoking. WHAT THINGS CAN I DO TO MAKE QUITTING EASIER?  Here are some tips to help you quit for good:  Pick a date when you will quit smoking completely. Tell all of your friends  and family about your plan to quit on that date.  Do not try to slowly cut down on the number of cigarettes you are smoking. Pick a quit date and quit smoking completely starting on that day.  Throw away all cigarettes.   Clean and remove all ashtrays from your home, work, and car.  On a card, write down your reasons for quitting. Carry the card with you and read it when you get the urge to smoke.  Cleanse your body of nicotine. Drink enough water and fluids to keep your urine clear or pale yellow. Do this after quitting to flush the nicotine from your body.  Learn to predict your moods. Do not let a bad situation be your excuse to have a cigarette. Some situations in your life might tempt you into wanting a cigarette.  Never have "just one" cigarette. It leads to wanting another and another. Remind yourself of your decision to quit.  Change habits associated with smoking. If you smoked while driving or when feeling stressed, try other activities to replace smoking. Stand up when drinking your coffee. Brush your teeth after eating. Sit in a different chair when you read the paper. Avoid alcohol while trying to quit, and try to drink fewer caffeinated beverages. Alcohol and caffeine may urge you to smoke.  Avoid foods and drinks that can trigger a desire to smoke, such as sugary or spicy foods and alcohol.  Ask people who smoke not to smoke around you.  Have something planned to do right after eating or having a cup of coffee. For example, plan to take a walk or exercise.  Try a relaxation exercise to calm you down and decrease your stress. Remember, you may be tense and nervous for the first 2 weeks after you quit, but this will pass.  Find new activities to keep your hands busy. Play with a pen, coin, or rubber band. Doodle or draw things on paper.  Brush your teeth right after eating. This will help cut down on the craving for the taste of tobacco after meals. You can also try  mouthwash.   Use oral substitutes in place of cigarettes. Try using lemon drops, carrots, cinnamon sticks, or chewing gum. Keep them handy so they are available when you have the urge  to smoke.  When you have the urge to smoke, try deep breathing.  Designate your home as a nonsmoking area.  If you are a heavy smoker, ask your health care provider about a prescription for nicotine chewing gum. It can ease your withdrawal from nicotine.  Reward yourself. Set aside the cigarette money you save and buy yourself something nice.  Look for support from others. Join a support group or smoking cessation program. Ask someone at home or at work to help you with your plan to quit smoking.  Always ask yourself, "Do I need this cigarette or is this just a reflex?" Tell yourself, "Today, I choose not to smoke," or "I do not want to smoke." You are reminding yourself of your decision to quit.  Do not replace cigarette smoking with electronic cigarettes (commonly called e-cigarettes). The safety of e-cigarettes is unknown, and some may contain harmful chemicals.  If you relapse, do not give up! Plan ahead and think about what you will do the next time you get the urge to smoke. HOW WILL I FEEL WHEN I QUIT SMOKING? You may have symptoms of withdrawal because your body is used to nicotine (the addictive substance in cigarettes). You may crave cigarettes, be irritable, feel very hungry, cough often, get headaches, or have difficulty concentrating. The withdrawal symptoms are only temporary. They are strongest when you first quit but will go away within 10-14 days. When withdrawal symptoms occur, stay in control. Think about your reasons for quitting. Remind yourself that these are signs that your body is healing and getting used to being without cigarettes. Remember that withdrawal symptoms are easier to treat than the major diseases that smoking can cause.  Even after the withdrawal is over, expect periodic urges  to smoke. However, these cravings are generally short lived and will go away whether you smoke or not. Do not smoke! WHAT RESOURCES ARE AVAILABLE TO HELP ME QUIT SMOKING? Your health care provider can direct you to community resources or hospitals for support, which may include:  Group support.  Education.  Hypnosis.  Therapy.   This information is not intended to replace advice given to you by your health care provider. Make sure you discuss any questions you have with your health care provider.   Document Released: 09/12/2004 Document Revised: 01/05/2015 Document Reviewed: 06/02/2013 Elsevier Interactive Patient Education [employer]

## 2015-11-24 NOTE — ED Provider Notes (Signed)
CSN: [id number]     Arrival date & time 11/24/15  1353 History   First MD Initiated Contact with Patient 11/24/15 1510     Chief Complaint  Patient presents with  . Cough   (Consider location/radiation/quality/duration/timing/severity/associated sxs/prior Treatment) HPI Comments: 54 year old male complaining of a cough for one week. He has frequent coughing spasms. Nothing seems to make it worse or better. He denies PND, earache, sore throat, shortness of breath or fever. He   has a history of smoking. he also has a history significant for non-STEMI MI followed by four-vessel CABG. Approximately 8 years later he had an additional heart catheterization which showed persistent CAD and occlusion of some of his stents and distal collaterals. His most recent EF was 40%. Echo reveals moderate posterior hypokinesis He is having no chest pain today. He has a history of dyslipidemia, statin intolerance, hypertension, fasting impaired glucose.   Past Medical History  Diagnosis Date  . History of Non-STEMI (non-ST elevated myocardial infarction) 02/22/2004    Three-vessel disease --> referred for CABG  . 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 --> CABG x 4  . S/P CABG x 02 February 2004    LIMA-LAD, SVG to OM 1, SVG to RCA,fRad-OM2  . Abnormal nuclear stress test August 2005    Occluded SVG-RCA  . History of Non-STEMI (non-ST elevated myocardial infarction) December 2011    Hazy lesion in SVG-OM1 --> staged PCI with 4.0 mm x 20 mm vision BMS (4.5 mm)  . CAD (coronary artery disease) of bypass graft May 2012; December 2013    Cath for angina and abnormal stress test: SVG-OM1 now occluded, attempt at PCI to the native circumflex OM1 unsuccessful; distal LAD beyond patent LIMA 70-80% (not PCI amenable) free radial-OM 2 patent; EF 45-50%  . Stable angina ([place]) 11/2012    Chronic,some what  stable but still present; cardiac cath results above, no significant change from 2012  . S/P most recent cardiac catheterization December 2013    severe; now nonrevascularizable, h/o MI x3 s/p CABG and PCI (native and bypass CAD), SVG-RCA & SVG-OM1 100%; patent LIMA -LAD (severe ~70-80% distal LAD disease after LIMA) & Free Radial OM2   . Ischemic cardiomyopathy Echo 10/2012    EF ~40%; moderate Posterior HypoKinesis, minld-moderate inferior Hypokinesis; mild RV dilation, mild concentric LVH  . HLD (hyperlipidemia)     Statin intolerant  . GERD (gastroesophageal reflux disease)     ?barrett's, with esophageal dysmotility, on omeprazole  . Barrett's esophagus     s/p EGD several years ago with Gillespie  . Varicose veins December 2013    with venous reflux status VNUS ablation of left greater saphenous wein  . Hypertension, benign   . Former heavy cigarette smoker (20-39 per day) May 2013   . Glucose intolerance (impaired glucose tolerance)  2009    Prediabetes  . Erectile dysfunction   . Testosterone deficiency     On replacement; goal is low normal.  . Gout   . Bronchitis, mucopurulent recurrent [facility])    Past Surgical History  Procedure Laterality Date  . Vein surgery  2011    solomon - h/o vericose veins  . Esophagogastroduodenoscopy    . US echocardiography  09/2012    mild LVH, EF 40%, mod posterior and inf wall hypokinesis,mild concentric LVH;; RV dilated, normal  fx.trace MR  . Coronary artery bypass  graft  02/27/04    LIMA -LAD, Free Radial-OM2 -- patent; SVG-OM1, SVG-RCA, - 100% occluded by recent cath  . Met/cpet  11/02/2012    suboptimal effort but peak VO2  was 52% which relatively significant. Myoview was suggestive for ant. ischemia may go along with his distal LAD   . Lower venous extremity doppler Left 08/15/2011    normal ,s/p vein ablation  . Nm myocar perf wall motion  12/15/2012    high risk study  . Coronary angioplasty with stent placement  12/25/2010    s/p  autologous vessel blockage  . Cardiac catheterization  May 01 2011    knowwn occlusion of SVG to RCA  as well as native RCA ; PCI to the SVG to OM1 ;patent LIMA to LAD with diffuse distal  LAD 70-80%,small  vessel dx not thought to be amenable to PCI .RCA 100% occluded ,OM2 patent,Circ diseased aftr OM1. EF 45-50%  . Cardiac catheterization  12/27/2012  . Left heart catheterization with coronary/graft angiogram N/A 12/27/2012    Procedure: LEFT HEART CATHETERIZATION WITH Beatrix Fetters;  Surgeon: Leonie Man, MD;  Location: [facility] CATH LAB;  Service: Cardiovascular;  Laterality: N/A;   Family History  Problem Relation Age of Onset  . Diabetes Paternal Aunt   . Coronary artery disease Paternal Uncle   . Stroke Maternal Uncle   . Cancer Neg Hx    Social History  Substance Use Topics  . Smoking status: Light Tobacco Smoker    Types: Cigarettes    Last Attempt to Quit: 04/28/2012  . Smokeless tobacco: Never Used     Comment: long time smoker, smokes occasional -anxiety  . Alcohol Use: Yes     Comment: occasionally    Review of Systems  Constitutional: Negative for fever and fatigue.  HENT: Negative for congestion, postnasal drip, sneezing, sore throat and trouble swallowing.   Eyes: Negative.   Respiratory: Positive for cough. Negative for choking and shortness of breath.   Cardiovascular: Negative for chest pain, palpitations and leg swelling.  Gastrointestinal: Negative.   Skin: Negative.   Neurological: Negative.   All other systems reviewed and are negative.   Allergies  Clopidogrel; Statins; Zetia; and Codeine  Home Medications   Prior to Admission medications   Medication Sig Start Date End Date Taking? Authorizing Provider  ALPRAZolam (XANAX) 0.25 MG tablet Take 0.25 mg by mouth as needed for sleep.   Yes Historical Provider, MD  APPLE CIDER VINEGAR PO Take by mouth.   Yes Historical Provider, MD  Astrid Drafts Omega-3 300 MG CAPS Take 300 mg by mouth daily.    Yes Historical Provider, MD  lansoprazole (PREVACID SOLUTAB) 15 MG disintegrating tablet Take 15 mg by mouth daily at 12 noon.   Yes Historical Provider, MD  lisinopril (PRINIVIL,ZESTRIL) 2.5 MG tablet TAKE 1 TABLET EVERY DAY 01/19/15  Yes Leonie Man, MD  prasugrel (EFFIENT) 10 MG TABS tablet Take 1 tablet (10 mg total) by mouth daily. 10/26/15  Yes Leonie Man, MD  pravastatin (PRAVACHOL) 40 MG tablet Take 1 tablet (40 mg total) by mouth daily. 12/11/14  Yes Ria Bush, MD  albuterol (PROVENTIL HFA;VENTOLIN HFA) 108 (90 BASE) MCG/ACT inhaler Inhale 2 puffs into the lungs every 4 (four) hours as needed for wheezing or shortness of breath. 11/24/15   Janne Napoleon, NP  COLCRYS 0.6 MG tablet TAKE AS DIRECTED 2 TABLETS BY MOUTH ON FIRST DAY THEN ONE DAILY AS NEEDED 09/20/14   Ria Bush, MD  fluorouracil (  EFUDEX) 5 % cream Apply topically 2 (two) times daily. 11/07/14   Max T Hyatt, DPM  hydrOXYzine (ATARAX/VISTARIL) 10 MG tablet Take 10 mg by mouth 3 (three) times daily as needed.    Historical Provider, MD  isosorbide dinitrate (ISORDIL) 20 MG tablet Take 1 tablet (20 mg total) by mouth 2 (two) times daily. 06/26/15   Leonie Man, MD  nitroGLYCERIN (NITROSTAT) 0.4 MG SL tablet Place 1 tablet (0.4 mg total) under the tongue every 5 (five) minutes as needed for chest pain. 11/17/13   Leonie Man, MD  omeprazole (PRILOSEC OTC) 20 MG tablet Take 20 mg by mouth daily.    Historical Provider, MD  prasugrel (EFFIENT) 10 MG TABS tablet Take 1 tablet (10 mg total) by mouth daily. 10/26/15   Leonie Man, MD  predniSONE (DELTASONE) 20 MG tablet Take 3 tabs po on first day, 2 tabs second day, 2 tabs third day, 1 tab fourth day, 1 tab 5th day. Take with food. 11/24/15   Janne Napoleon, NP  ranolazine (RANEXA) 1000 MG SR tablet Take 1 tablet (1,000 mg total) by mouth twice daily, as directed. 09/04/15   Leonie Man, MD  triamcinolone cream (KENALOG) 0.1 % Apply 1 application topically 2  (two) times daily as needed.    Historical Provider, MD   Meds Ordered and Administered this Visit   Medications  ipratropium-albuterol (DUONEB) 0.5-2.5 (3) MG/3ML nebulizer solution 3 mL (3 mLs Nebulization Given 11/24/15 1603)  triamcinolone acetonide (KENALOG-40) injection 40 mg (40 mg Intramuscular Given 11/24/15 1603)    BP 131/82 mmHg  Pulse 77  Temp(Src) 97 F (36.1 C) (Oral)  Resp 16  SpO2 97% No data found.   Physical Exam  Constitutional: He is oriented to person, place, and time. He appears well-developed and well-nourished. No distress.  HENT:  Mouth/Throat: Oropharynx is clear and moist. No oropharyngeal exudate.  Eyes: EOM are normal.  Neck: Normal range of motion. Neck supple.  Cardiovascular: Normal rate, regular rhythm and normal heart sounds.   Pulmonary/Chest: He has wheezes.  Bilateral, diffuse wheezing and coarseness.  Musculoskeletal: Normal range of motion.  Lymphadenopathy:    He has no cervical adenopathy.  Neurological: He is alert and oriented to person, place, and time.  Skin: Skin is warm and dry.  Psychiatric: He has a normal mood and affect.  Nursing note and vitals reviewed.   ED Course  Procedures (including critical care time)  Labs Review Labs Reviewed - No data to display  Imaging Review No results found.   Visual Acuity Review  Right Eye Distance:   Left Eye Distance:   Bilateral Distance:    Right Eye Near:   Left Eye Near:    Bilateral Near:         MDM   1. Bronchospasm   2. Cough due to bronchospasm   3. Tobacco abuse disorder    Status post DuoNeb and Kenalog 40 mg IM the patient states he is breathing better, having less coughing and able to take in more air. Auscultation reveals a substantial reduction in wheeze and much improvement in air movement. No crackles. The patient has had no fever or shortness of breath. Doubt this is due to infection. More likely due to his smoking for several years. Also invert  middle allergies may be playing a role here. Albuterol HFA 2 puffs every 4 hours when necessary cough and wheeze Prednisone taper dose Should follow-up with PCP next week as needed. Stop all  smoking.     Janne Napoleon, NP 11/24/15 [phone]

## 2015-11-24 NOTE — ED Notes (Signed)
C/o a dry cough onset 1 week associated w/congsetion Taking OTC cough meds w/no relief A&O x4... No acuate distress.

## 2015-11-29 ENCOUNTER — Encounter: Payer: Self-pay | Admitting: Family Medicine

## 2015-11-29 ENCOUNTER — Ambulatory Visit (INDEPENDENT_AMBULATORY_CARE_PROVIDER_SITE_OTHER): Payer: Commercial Managed Care - HMO | Admitting: Family Medicine

## 2015-11-29 VITALS — BP 106/74 | HR 96 | Temp 97.8°F | Ht 74.0 in | Wt 238.5 lb

## 2015-11-29 DIAGNOSIS — J9801 Acute bronchospasm: Secondary | ICD-10-CM

## 2015-11-29 MED ORDER — NITROGLYCERIN 0.4 MG SL SUBL: 0.4000 mg | SUBLINGUAL_TABLET | SUBLINGUAL | Status: DC | PRN

## 2015-11-29 MED ORDER — HYDROCODONE-HOMATROPINE 5-1.5 MG/5ML PO SYRP: 5.0000 mL | ORAL_SOLUTION | Freq: Every evening | ORAL | Status: DC | PRN

## 2015-11-29 NOTE — Assessment & Plan Note (Signed)
Improving with steroid. Complete. NO sign of current infection. Quit smoking.  Given cough suppressant. Start zyrtec for possible allergy component.

## 2015-11-29 NOTE — Patient Instructions (Addendum)
Rest, fluids.  Complete prednisone taper.  Cough suppressant at night.  Start zyrtec at bedtime.  Given cough more time to resolve.  Call if not continuing to improve in next 2-4 weeks.

## 2015-11-29 NOTE — Progress Notes (Signed)
Pre visit review using our clinic review tool, if applicable. No additional management support is needed unless otherwise documented below in the visit note. 

## 2015-11-29 NOTE — Progress Notes (Signed)
   Subjective:    Patient ID: Calvin Francis, male    DOB: [birth year], 54 y.o.   MRN: [id number]  Cough This is a new problem. The current episode started 1 to 4 weeks ago (2 weeks). The problem has been gradually improving (fells better overall, just persistent cough). The problem occurs every few hours. The cough is non-productive. Associated symptoms include myalgias, rhinorrhea and a sore throat. Pertinent negatives include no chills, ear congestion, ear pain, fever, nasal congestion, shortness of breath or wheezing. Associated symptoms comments: Tickle in back of throat. Coughing fits.. The symptoms are aggravated by lying down. Risk factors for lung disease include smoking/tobacco exposure. Treatments tried: mucinex, ibuprofen. His past medical history is significant for environmental allergies. There is no history of asthma or COPD.   Seen at urgent care 11/26 for bronchospasm: inhaler not helping, given DuoNeb and Kenalog 40 mg IM  Better, albuterol and prednisone taper.  Feels better but still with cough. Social History /Family History/Past Medical History reviewed and updated if needed.   Review of Systems  Constitutional: Negative for fever and chills.  HENT: Positive for rhinorrhea and sore throat. Negative for ear pain.   Respiratory: Positive for cough. Negative for shortness of breath and wheezing.   Musculoskeletal: Positive for myalgias.  Allergic/Immunologic: Positive for environmental allergies.       Objective:   Physical Exam  Constitutional: Vital signs are normal. He appears well-developed and well-nourished.  Non-toxic appearance. He does not appear ill. No distress.  HENT:  Head: Normocephalic and atraumatic.  Right Ear: Hearing, tympanic membrane, external ear and ear canal normal. No tenderness. No foreign bodies. Tympanic membrane is not retracted and not bulging.  Left Ear: Hearing, tympanic membrane, external ear and ear canal normal. No tenderness. No foreign  bodies. Tympanic membrane is not retracted and not bulging.  Nose: Nose normal. No mucosal edema or rhinorrhea. Right sinus exhibits no maxillary sinus tenderness and no frontal sinus tenderness. Left sinus exhibits no maxillary sinus tenderness and no frontal sinus tenderness.  Mouth/Throat: Uvula is midline, oropharynx is clear and moist and mucous membranes are normal. Normal dentition. No dental caries. No oropharyngeal exudate or tonsillar abscesses.  Eyes: Conjunctivae, EOM and lids are normal. Pupils are equal, round, and reactive to light. Lids are everted and swept, no foreign bodies found.  Neck: Trachea normal, normal range of motion and phonation normal. Neck supple. Carotid bruit is not present. No thyroid mass and no thyromegaly present.  Cardiovascular: Normal rate, regular rhythm, S1 normal, S2 normal, normal heart sounds, intact distal pulses and normal pulses.  Exam reveals no gallop.   No murmur heard. Pulmonary/Chest: Effort normal and breath sounds normal. No respiratory distress. He has no wheezes. He has no rhonchi. He has no rales.  Abdominal: Soft. Normal appearance and bowel sounds are normal. There is no hepatosplenomegaly. There is no tenderness. There is no rebound, no guarding and no CVA tenderness. No hernia.  Neurological: He is alert. He has normal reflexes.  Skin: Skin is warm, dry and intact. No rash noted.  Psychiatric: He has a normal mood and affect. His speech is normal and behavior is normal. Judgment normal.          Assessment & Plan:

## 2015-12-05 ENCOUNTER — Other Ambulatory Visit: Payer: Self-pay | Admitting: Cardiology

## 2015-12-05 NOTE — Telephone Encounter (Signed)
REFILL 

## 2015-12-19 ENCOUNTER — Telehealth: Payer: Self-pay | Admitting: Cardiology

## 2015-12-19 NOTE — Telephone Encounter (Signed)
Patient calling the office for samples of medication:   1.  What medication and dosage are you requesting samples for? Effient and Ranexa  2.  Are you currently out of this medication? no

## 2015-12-19 NOTE — Telephone Encounter (Signed)
Pt aware samples provided at front desk.

## 2016-01-07 ENCOUNTER — Ambulatory Visit: Payer: Commercial Managed Care - HMO | Admitting: Cardiology

## 2016-01-15 ENCOUNTER — Ambulatory Visit (INDEPENDENT_AMBULATORY_CARE_PROVIDER_SITE_OTHER): Payer: Commercial Managed Care - HMO | Admitting: Cardiology

## 2016-01-15 ENCOUNTER — Encounter: Payer: Self-pay | Admitting: Cardiology

## 2016-01-15 VITALS — BP 120/90 | HR 83 | Ht 74.0 in | Wt 237.3 lb

## 2016-01-15 DIAGNOSIS — I83893 Varicose veins of bilateral lower extremities with other complications: Secondary | ICD-10-CM

## 2016-01-15 DIAGNOSIS — R0683 Snoring: Secondary | ICD-10-CM

## 2016-01-15 DIAGNOSIS — I2589 Other forms of chronic ischemic heart disease: Secondary | ICD-10-CM | POA: Diagnosis not present

## 2016-01-15 DIAGNOSIS — E785 Hyperlipidemia, unspecified: Secondary | ICD-10-CM | POA: Diagnosis not present

## 2016-01-15 DIAGNOSIS — I255 Ischemic cardiomyopathy: Secondary | ICD-10-CM

## 2016-01-15 DIAGNOSIS — F17200 Nicotine dependence, unspecified, uncomplicated: Secondary | ICD-10-CM

## 2016-01-15 DIAGNOSIS — I10 Essential (primary) hypertension: Secondary | ICD-10-CM | POA: Diagnosis not present

## 2016-01-15 DIAGNOSIS — I2510 Atherosclerotic heart disease of native coronary artery without angina pectoris: Secondary | ICD-10-CM

## 2016-01-15 DIAGNOSIS — Z720 Tobacco use: Secondary | ICD-10-CM

## 2016-01-15 DIAGNOSIS — I25708 Atherosclerosis of coronary artery bypass graft(s), unspecified, with other forms of angina pectoris: Secondary | ICD-10-CM | POA: Diagnosis not present

## 2016-01-15 DIAGNOSIS — G4700 Insomnia, unspecified: Secondary | ICD-10-CM | POA: Diagnosis not present

## 2016-01-15 DIAGNOSIS — G4733 Obstructive sleep apnea (adult) (pediatric): Secondary | ICD-10-CM

## 2016-01-15 DIAGNOSIS — I208 Other forms of angina pectoris: Secondary | ICD-10-CM

## 2016-01-15 DIAGNOSIS — I25119 Atherosclerotic heart disease of native coronary artery with unspecified angina pectoris: Secondary | ICD-10-CM

## 2016-01-15 DIAGNOSIS — Z79899 Other long term (current) drug therapy: Secondary | ICD-10-CM

## 2016-01-15 DIAGNOSIS — I2089 Other forms of angina pectoris: Secondary | ICD-10-CM

## 2016-01-15 LAB — LIPID PANEL
CHOLESTEROL: 242 mg/dL — AB (ref 125–200)
HDL: 44 mg/dL (ref >=40)
LDL Cholesterol: 164 mg/dL — ABNORMAL HIGH (ref <130)
TRIGLYCERIDES: 172 mg/dL — AB (ref <150)
Total CHOL/HDL Ratio: 5.5 Ratio — ABNORMAL HIGH (ref <=5.0)
VLDL: 34 mg/dL — AB (ref <30)

## 2016-01-15 NOTE — Progress Notes (Signed)
PCP: Ria Bush, MD  Clinic Note: Chief Complaint  Patient presents with  . Follow-up    6 monthe rov. patient reports chest pain, SOB with walking stairs.  . Chest Pain    Chronic stable  . Coronary Artery Disease  . Varicose Veins    HPI: Calvin Francis is a 55 y.o. male with a PMH below who presents today for 6 month f/u of chronic CAD. He was a former patient of Dr. Chase Picket, who I first met back in December 2011 when he presented with a Non-STEMI w/ staged PCI of SVG-OM1 with a BMS. He has had several catheterizations and several stress tests since then:  occluded SVG-OM1 prior to the stent,   occluded SVG-RCA with occluded native RCA.   Patent LIMA-LAD with a distal LAD 70% lesion not amenable to PCI  Patent Radial Graft to OM2  An attempt was made to intervene on the native circumflex which was unsuccessful. His lipid control is very poor because of statin and other medication intolerance.  Calvin Francis was last seen on 06/26/2015 -- he was noticing that he was previously having long episodes of chest pain at that time associated with dyspnea.  Recent Hospitalizations: None  Studies Reviewed: None  Interval History: Calvin Francis presents today really pretty much in his usual state of health. He still has his chronic stable anginal symptoms that keeps him from doing much the way of any significant exertion but is able to do routine day-to-day activities. He has done well with Ranexa, but is having issues with the doughnut hole. As long as he is taking Ranexa, his angina is relatively controlled. He is not taking this or by nitrate anymore. He deathly notes exertional dyspnea, but also thinks a lot of 40s noticing his symptoms of anxiety when he has a hard time breathing. He will get short of breath walking up and down stairs or up the hill briskly, but has denied any significant PND, but does have baseline orthopnea. No significant  edema. He does have pretty  significant varicose veins despite having a prior ablation procedure, he has varicose veins all the way up his thighs. These are somewhat painful and uncomfortable. He has some cramping sensations as well.   No palpitations, lightheadedness, dizziness, weakness or syncope/near syncope. No TIA/amaurosis fugax symptoms. No melena, hematochezia, hematuria, or epstaxis. No claudication.  ROS: A comprehensive was performed. Review of Systems  Constitutional: Positive for malaise/fatigue (He does note some fatigue associated medications.).  HENT: Negative for ear discharge and nosebleeds.   Cardiovascular: Positive for orthopnea (baseline).  Gastrointestinal: Negative for abdominal pain, diarrhea, constipation and melena.  Genitourinary: Negative for hematuria.  Musculoskeletal: Negative for myalgias and neck pain.  Neurological: Negative for dizziness, focal weakness and loss of consciousness.  Psychiatric/Behavioral: The patient has insomnia (has a hard time getting to sleep and then cannot get back to sleep).     Past Medical History  Diagnosis Date  . History of Non-STEMI (non-ST elevated myocardial infarction) 02/22/2004    Three-vessel disease --> referred for CABG  . 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 --> CABG x 4  . S/P CABG x 02 February 2004    LIMA-LAD, SVG to OM 1, SVG to RCA,fRad-OM2  . Abnormal nuclear stress test August 2005    Occluded SVG-RCA  . History of Non-STEMI (non-ST elevated myocardial  infarction) December 2011    Hazy lesion in SVG-OM1 --> staged PCI with 4.0 mm x 20 mm vision BMS (4.5 mm)  . CAD (coronary artery disease) of bypass graft May 2012; December 2013    Cath for angina and abnormal stress test: SVG-OM1 now occluded, attempt at PCI to the native circumflex OM1 unsuccessful; distal LAD beyond patent LIMA 70-80% (not PCI amenable) free  radial-OM 2 patent; EF 45-50%  . Stable angina ([place]) 11/2012    Chronic,some what stable but still present; cardiac cath results above, no significant change from 2012  . S/P most recent cardiac catheterization December 2013    severe; now nonrevascularizable, h/o MI x3 s/p CABG and PCI (native and bypass CAD), SVG-RCA & SVG-OM1 100%; patent LIMA -LAD (severe ~70-80% distal LAD disease after LIMA) & Free Radial OM2   . Ischemic cardiomyopathy Echo 10/2012    EF ~40%; moderate Posterior HypoKinesis, minld-moderate inferior Hypokinesis; mild RV dilation, mild concentric LVH  . HLD (hyperlipidemia)     Statin intolerant  . GERD (gastroesophageal reflux disease)     ?barrett's, with esophageal dysmotility, on omeprazole  . Barrett's esophagus     s/p EGD several years ago with [place]  . Varicose veins December 2013    with venous reflux status VNUS ablation of left greater saphenous wein  . Hypertension, benign   . Former heavy cigarette smoker (20-39 per day) May 2013   . Glucose intolerance (impaired glucose tolerance)  2009    Prediabetes  . Erectile dysfunction   . Testosterone deficiency     On replacement; goal is low normal.  . Gout   . Bronchitis, mucopurulent recurrent [facility])     Past Surgical History  Procedure Laterality Date  . Vein surgery  2011    solomon - h/o vericose veins  . Esophagogastroduodenoscopy    . US echocardiography  09/2012    mild LVH, EF 40%, mod posterior and inf wall hypokinesis,mild concentric LVH;; RV dilated, normal  fx.trace MR  . Coronary artery bypass graft  02/27/04    LIMA -LAD, Free Radial-OM2 -- patent; SVG-OM1, SVG-RCA, - 100% occluded by recent cath  . Met/cpet  11/02/2012    suboptimal effort but peak VO2  was 52% which relatively significant. Myoview was suggestive for ant. ischemia may go along with his distal LAD   . Lower venous extremity doppler Left 08/15/2011    normal ,s/p vein ablation  . Nm myocar perf wall motion  12/15/2012     high risk study  . Coronary angioplasty with stent placement  12/25/2010    s/p autologous vessel blockage  . Cardiac catheterization  May 01 2011    knowwn occlusion of SVG to RCA  as well as native RCA ; PCI to the SVG to OM1 ;patent LIMA to LAD with diffuse distal  LAD 70-80%,small  vessel dx not thought to be amenable to PCI .RCA 100% occluded ,OM2 patent,Circ diseased aftr OM1. EF 45-50%  . Cardiac catheterization  12/27/2012  . Left heart catheterization with coronary/graft angiogram N/A 12/27/2012    Procedure: LEFT HEART CATHETERIZATION WITH Beatrix Fetters;  Surgeon: Leonie Man, MD;  Location[facility] CATH LAB;  Service: Cardiovascular;  Laterality: N/A;    Prior to Admission medications   Medication Sig Start Date End Date Taking? Authorizing Provider  albuterol (PROVENTIL HFA;VENTOLIN HFA) 108 (90 BASE) MCG/ACT inhaler Inhale 2 puffs into the lungs every 4 (four) hours as needed for wheezing or shortness of breath. 11/24/15  Yes  Janne Napoleon, NP  ALPRAZolam Duanne Moron) 0.25 MG tablet Take 0.25 mg by mouth as needed for sleep.   Yes Historical Provider, MD  APPLE CIDER VINEGAR PO Take by mouth.   Yes Historical Provider, MD  COLCRYS 0.6 MG tablet TAKE AS DIRECTED 2 TABLETS BY MOUTH ON FIRST DAY THEN ONE DAILY AS NEEDED 09/20/14  Yes Ria Bush, MD  fluorouracil (EFUDEX) 5 % cream Apply topically 2 (two) times daily. 11/07/14  Yes Milton, DPM  HYDROcodone-homatropine (HYCODAN) 5-1.5 MG/5ML syrup Take 5 mLs by mouth at bedtime as needed for cough. 11/29/15  Yes Amy Cletis Athens, MD  hydrOXYzine (ATARAX/VISTARIL) 10 MG tablet Take 10 mg by mouth 3 (three) times daily as needed.   Yes Historical Provider, MD  isosorbide dinitrate (ISORDIL) 20 MG tablet Take 1 tablet (20 mg total) by mouth 2 (two) times daily. 06/26/15  Yes Leonie Man, MD  [name] Oil Omega-3 300 MG CAPS Take 300 mg by mouth daily.   Yes Historical Provider, MD  lansoprazole (PREVACID SOLUTAB) 15 MG  disintegrating tablet Take 15 mg by mouth daily at 12 noon.   Yes Historical Provider, MD  lisinopril (PRINIVIL,ZESTRIL) 2.5 MG tablet TAKE 1 TABLET EVERY DAY 12/05/15  Yes Leonie Man, MD  nitroGLYCERIN (NITROSTAT) 0.4 MG SL tablet Place 1 tablet (0.4 mg total) under the tongue every 5 (five) minutes as needed for chest pain. 11/29/15  Yes Amy Cletis Athens, MD  omeprazole (PRILOSEC OTC) 20 MG tablet Take 20 mg by mouth daily.   Yes Historical Provider, MD  prasugrel (EFFIENT) 10 MG TABS tablet Take 1 tablet (10 mg total) by mouth daily. 10/26/15  Yes Leonie Man, MD  pravastatin (PRAVACHOL) 40 MG tablet Take 1 tablet (40 mg total) by mouth daily. 12/11/14  Yes Ria Bush, MD  predniSONE (DELTASONE) 20 MG tablet Take 3 tabs po on first day, 2 tabs second day, 2 tabs third day, 1 tab fourth day, 1 tab 5th day. Take with food. 11/24/15  Yes Janne Napoleon, NP  ranolazine (RANEXA) 1000 MG SR tablet Take 1 tablet (1,000 mg total) by mouth twice daily, as directed. 09/04/15  Yes Leonie Man, MD  triamcinolone cream (KENALOG) 0.1 % Apply 1 application topically 2 (two) times daily as needed.   Yes Historical Provider, MD   Allergies  Allergen Reactions  . Clopidogrel Other (See Comments)    Disoriented   . Statins   . Zetia [Ezetimibe]   . Codeine Rash     Social History   Social History  . Marital Status: Married    Spouse Name: N/A  . Number of Children: N/A  . Years of Education: N/A   Social History Main Topics  . Smoking status: Light Tobacco Smoker    Types: Cigarettes    Last Attempt to Quit: 04/28/2012  . Smokeless tobacco: Never Used     Comment: long time smoker, smokes occasional -anxiety  . Alcohol Use: 0.0 oz/week    0 Standard drinks or equivalent per week     Comment: occasionally  . Drug Use: No  . Sexual Activity: Not Asked   Other Topics Concern  . None   Social History Narrative   Caffeine: 1 cup coffee in am   Lives with wife, 2 dogs   Married, 2  Grown children, 3 grandchildren   Occupation: disability from cardiac status for last year, prior worked for [employer]   Activity: walks driveway, limited by chest pain/SOB -- is  trying to pick up his activity.   Diet: good water, fruits/vegetables daily   Family History  Problem Relation Age of Onset  . Diabetes Paternal Aunt   . Coronary artery disease Paternal Uncle   . Stroke Maternal Uncle   . Cancer Neg Hx     Wt Readings from Last 3 Encounters:  01/15/16 237 lb 4.8 oz (107.639 kg)  11/29/15 238 lb 8 oz (108.183 kg)  06/26/15 239 lb (108.41 kg)    PHYSICAL EXAM BP 120/90 mmHg  Pulse 83  Ht 6' 2"  (1.88 m)  Wt 237 lb 4.8 oz (107.639 kg)  BMI 30.45 kg/m2 General appearance: alert, cooperative, appears stated age, no distress and mildly obese; pleasant mood and affect HEENT: [place]/AT, EOMI, MMM, anicteric sclera Neck: no adenopathy, no carotid bruit and no JVD Lungs: clear to auscultation bilaterally, normal percussion bilaterally and non-labored Heart: RRR, normal S1/S2, no M./R./G. Nondisplaced PMI. Abdomen: soft, non-tender; bowel sounds normal; no masses, no organomegaly; no HJR Extremities: extremities normal, atraumatic, no cyanosis, and edema  Pulses: 2+ and symmetric;  Neurologic: Mental status: Alert, oriented, thought content appropriate Cranial nerves: normal (II-XII grossly intact)    Adult ECG Report  Rate: 83 ;  Rhythm: normal sinus rhythm and T-wave inversion in aVL. Poor R-wave progression to precordial leads. Otherwise normal axis, intervals and durations. Low voltage in limb leads;   Narrative Interpretation: Stable, non-ischemic EKG   Other studies Reviewed: Additional studies/ records that were reviewed today include:  Recent Labs:   .lastlipid - 2014   ASSESSMENT / PLAN: Problem List Items Addressed This Visit    Varicose veins of leg with swelling (Chronic)    This is the first, seen the extent of varicose veins up in his thighs. He  definitely has been present in his feet and is wearing compression stockings. With this he has significant cramping and aching during the course of the evening.   May need to refer him back to the vascular surgeons vs. vein and vascular center.  Check: Venous Dopplers of the lower extremities. I will also refer him to Dr. Donnetta Hutching for follow-up. To consider possible invasive procedures for symptom relief. He is basically exquisitely sensitive to exertional dyspnea and other perturbations.  We'll then recheck lower extremity venous Dopplers, hopefully prior to seeing Dr. Donnetta Hutching.      Relevant Orders   EKG 12-Lead (Completed)   Lipid panel (Completed)   VAS [place] LOWER EXTREMITY VENOUS REFLUX   Ambulatory referral to Vascular Surgery   Stable angina: Class I-II (Chronic)    He is doing about as well as can be am. Didn't really do well on the Isordil. He is now back on Ranexa. He is trying to take 1000 mg twice a day, but is having had some difficulty. He is tolerating it well and it is helping his angina. Did not appear to have that much the way of significant treatment options for symptoms at this juncture. My impression would be that if he truly does have anginal symptoms, I will probably skip a stress test and consider catheterization based on his history.  Plan: Continue Ranexa. Unfortunately did not tolerate diuretic. Continue when necessary nitroglycerin. At some point, his symptoms may progress to the point we need do a relook catheterization. Could consider the possibility of CTO treatment.      Smoker    Definitely light smoker, but still maybe 3-4 day cigarettes. Talked again about potentially using NicoDerm or patch or Nicorette gum.  Obstructive sleep apnea of adult (Chronic)   Ischemic cardiomyopathy (Chronic)    Overall no real significant heart failure symptoms. He is on a low-dose ACE inhibitor which is the most I can put on. Does not she is overly walling overloaded. For now  would just simply monitor and treat as previously done.      Relevant Orders   EKG 12-Lead (Completed)   Lipid panel (Completed)   VAS [place] LOWER EXTREMITY VENOUS REFLUX   Ambulatory referral to Vascular Surgery   Hyperlipidemia with target LDL less than 70 with statin related symptoms (Chronic)    He is still tolerating pravastatin. We will order a relook lipid panel He seems to be having a little bit of an issue with taking a statin. I think I would like to check a lipid panel and potentially referred to our pharmacist, Tommy Medal, RPH-CCP      Relevant Orders   EKG 12-Lead (Completed)   Lipid panel (Completed)   VAS [place] LOWER EXTREMITY VENOUS REFLUX   Ambulatory referral to Vascular Surgery   Essential hypertension (Chronic)    Diastolic pressures are below the elevated day.  It is overall okay. He is on low-dose lisinopril which is all he can tolerate.      Relevant Orders   EKG 12-Lead (Completed)   Lipid panel (Completed)   VAS [place] LOWER EXTREMITY VENOUS REFLUX   Ambulatory referral to Vascular Surgery   CAD (coronary artery disease) of bypass graft (Chronic)    He definitely has long-standing graft disease. Probably has microvascular ischemia at baseline. I think during this upcoming year, he is due for relook stress test. Otherwise for symptoms, I would probably skip stress test and consider catheterization.      Relevant Orders   EKG 12-Lead (Completed)   Lipid panel (Completed)   VAS [place] LOWER EXTREMITY VENOUS REFLUX   Ambulatory referral to Vascular Surgery   Atherosclerotic heart disease of native coronary artery with angina pectoris ([place]) (Chronic)    He is still on Effient, and I think we may be a little switch to Plavix in the future if financial issues become a problem by them in next year. He's not having any severe bleeding issues. There didn't seem to be that much the way of any significant PCI targets from last evaluation. Not on a statin which will be  addressed in the section.      3-vessel CAD - Primary (Chronic)   Relevant Orders   EKG 12-Lead (Completed)   Lipid panel (Completed)   VAS [place] LOWER EXTREMITY VENOUS REFLUX   Ambulatory referral to Vascular Surgery    Other Visit Diagnoses    Snoring        Relevant Orders    EKG 12-Lead (Completed)    Lipid panel (Completed)    VAS [place] LOWER EXTREMITY VENOUS REFLUX    Ambulatory referral to Vascular Surgery    Insomnia        Relevant Orders    EKG 12-Lead (Completed)    Lipid panel (Completed)    VAS [place] LOWER EXTREMITY VENOUS REFLUX    Ambulatory referral to Vascular Surgery    Drug therapy        Relevant Orders    Lipid panel (Completed)    Chronic stable angina (HCC)           Current medicines are reviewed at length with the patient today. (+/- concerns) none currenlty The following changes have been made:   You have  been referred to Dr Claiborne Billings FOR SLEEP EVALUATION- POSSIBLE SLEEP STUDY. ( CAN BE A REGULAR SCHEDULE DAY)  You have been referred to  DR EARLY - AFTER DOPPLER ARE COMPLETED   LABS- LIPID   Studies Ordered:   Orders Placed This Encounter  Procedures  . Lipid panel  . Ambulatory referral to Vascular Surgery  . EKG 12-Lead     ROV 6 months --    Leonie Man, M.D., M.S. Interventional Cardiologist   Pager # [phone]

## 2016-01-15 NOTE — Patient Instructions (Signed)
You have been referred to Dr Tresa Endo FOR SLEEP EVALUATION- POSSIBLE SLEEP STUDY. ( CAN BE A REGULAR SCHEDULE DAY)  You have been referred to  DR EARLY - AFTER DOPPLER ARE COMPLETED   LABS- LIPID  Your physician has requested that you have a lower  venous duplex - VENOUS  INSUFF. This test is an ultrasound of the veins in the legs. It looks at venous blood flow that carries blood from the heart to the legs. Allow one hour for a Lower Venous exam. There are no restrictions or special instructions.     Your physician wants you to follow-up in 6 MONTHS WITH DR [facility] - 30 MINS You will receive a reminder letter in the mail two months in advance. If you don't receive a letter, please call our office to schedule the follow-up appointment.

## 2016-01-17 ENCOUNTER — Encounter: Payer: Self-pay | Admitting: Cardiology

## 2016-01-17 DIAGNOSIS — G4733 Obstructive sleep apnea (adult) (pediatric): Secondary | ICD-10-CM | POA: Insufficient documentation

## 2016-01-17 NOTE — Assessment & Plan Note (Signed)
He is still tolerating pravastatin. We will order a relook lipid panel He seems to be having a little bit of an issue with taking a statin. I think I would like to check a lipid panel and potentially referred to our pharmacist, Phillips Hay, RPH-CCP

## 2016-01-17 NOTE — Assessment & Plan Note (Signed)
Overall no real significant heart failure symptoms. He is on a low-dose ACE inhibitor which is the most I can put on. Does not she is overly walling overloaded. For now would just simply monitor and treat as previously done.

## 2016-01-17 NOTE — Assessment & Plan Note (Signed)
Definitely light smoker, but still maybe 3-4 day cigarettes. Talked again about potentially using NicoDerm or patch or Nicorette gum.

## 2016-01-17 NOTE — Assessment & Plan Note (Signed)
He definitely has long-standing graft disease. Probably has microvascular ischemia at baseline. I think during this upcoming year, he is due for relook stress test. Otherwise for symptoms, I would probably skip stress test and consider catheterization.

## 2016-01-17 NOTE — Assessment & Plan Note (Addendum)
This is the first, seen the extent of varicose veins up in his thighs. He definitely has been present in his feet and is wearing compression stockings. With this he has significant cramping and aching during the course of the evening.   May need to refer him back to the vascular surgeons vs. vein and vascular center.  Check: Venous Dopplers of the lower extremities. I will also refer him to Dr. Arbie Cookey for follow-up. To consider possible invasive procedures for symptom relief. He is basically exquisitely sensitive to exertional dyspnea and other perturbations.  We'll then recheck lower extremity venous Dopplers, hopefully prior to seeing Dr. Arbie Cookey.

## 2016-01-17 NOTE — Assessment & Plan Note (Signed)
Diastolic pressures are below the elevated day.  It is overall okay. He is on low-dose lisinopril which is all he can tolerate.

## 2016-01-17 NOTE — Assessment & Plan Note (Signed)
He is still on Effient, and I think we may be a little switch to Plavix in the future if financial issues become a problem by them in next year. He's not having any severe bleeding issues. There didn't seem to be that much the way of any significant PCI targets from last evaluation. Not on a statin which will be addressed in the section.

## 2016-01-17 NOTE — Assessment & Plan Note (Addendum)
He is doing about as well as can be am. Didn't really do well on the Isordil. He is now back on Ranexa. He is trying to take 1000 mg twice a day, but is having had some difficulty. He is tolerating it well and it is helping his angina. Did not appear to have that much the way of significant treatment options for symptoms at this juncture. My impression would be that if he truly does have anginal symptoms, I will probably skip a stress test and consider catheterization based on his history.  Plan: Continue Ranexa. Unfortunately did not tolerate diuretic. Continue when necessary nitroglycerin. At some point, his symptoms may progress to the point we need do a relook catheterization. Could consider the possibility of CTO treatment.

## 2016-01-22 ENCOUNTER — Other Ambulatory Visit: Payer: Self-pay | Admitting: Cardiology

## 2016-01-22 DIAGNOSIS — I83893 Varicose veins of bilateral lower extremities with other complications: Secondary | ICD-10-CM

## 2016-01-23 ENCOUNTER — Telehealth: Payer: Self-pay | Admitting: *Deleted

## 2016-01-23 NOTE — Telephone Encounter (Signed)
-----   Message from Sampson Goon sent at 01/15/2016  1:13 PM EST ----- Regarding: sleep eval   Jasmine December  The patient did not want to scheduled the sleep evaluation at this time.  [facility]

## 2016-01-23 NOTE — Telephone Encounter (Signed)
-----   Message from Marykay Lex, MD sent at 01/22/2016 10:36 PM EST ----- Triglycerides look better, but TC, & LDL look worse.  This should be enough for Belenda Cruise to start with.  Marykay Lex, MD

## 2016-01-23 NOTE — Telephone Encounter (Signed)
Left detailed message on secure voicemail. May call back with any question Release to my chart Will route to [facility]- pharm-d  For lipid clinic.--

## 2016-01-28 ENCOUNTER — Inpatient Hospital Stay (HOSPITAL_COMMUNITY): Admission: RE | Admit: 2016-01-28 | Payer: Commercial Managed Care - HMO | Source: Ambulatory Visit

## 2016-02-05 ENCOUNTER — Inpatient Hospital Stay (HOSPITAL_COMMUNITY): Admission: RE | Admit: 2016-02-05 | Payer: Commercial Managed Care - HMO | Source: Ambulatory Visit

## 2016-02-12 ENCOUNTER — Encounter: Payer: Self-pay | Admitting: Vascular Surgery

## 2016-02-12 ENCOUNTER — Ambulatory Visit (HOSPITAL_COMMUNITY)
Admission: RE | Admit: 2016-02-12 | Discharge: 2016-02-12 | Disposition: A | Discharge status: Home / Self Care | Payer: Commercial Managed Care - HMO | Source: Ambulatory Visit | Attending: Cardiology | Admitting: Cardiology

## 2016-02-12 DIAGNOSIS — I83893 Varicose veins of bilateral lower extremities with other complications: Secondary | ICD-10-CM | POA: Diagnosis not present

## 2016-02-12 DIAGNOSIS — E785 Hyperlipidemia, unspecified: Secondary | ICD-10-CM | POA: Insufficient documentation

## 2016-02-12 DIAGNOSIS — R600 Localized edema: Secondary | ICD-10-CM | POA: Diagnosis not present

## 2016-02-12 DIAGNOSIS — Z87891 Personal history of nicotine dependence: Secondary | ICD-10-CM | POA: Insufficient documentation

## 2016-02-12 DIAGNOSIS — I10 Essential (primary) hypertension: Secondary | ICD-10-CM | POA: Diagnosis not present

## 2016-02-14 ENCOUNTER — Encounter: Payer: Self-pay | Admitting: Pharmacist Clinician (PhC)/ Clinical Pharmacy Specialist

## 2016-02-14 ENCOUNTER — Ambulatory Visit (INDEPENDENT_AMBULATORY_CARE_PROVIDER_SITE_OTHER): Payer: Commercial Managed Care - HMO | Admitting: Pharmacist Clinician (PhC)/ Clinical Pharmacy Specialist

## 2016-02-14 VITALS — Ht 74.0 in | Wt 243.0 lb

## 2016-02-14 DIAGNOSIS — E785 Hyperlipidemia, unspecified: Secondary | ICD-10-CM

## 2016-02-14 NOTE — Assessment & Plan Note (Addendum)
Mr Tanguma is a 55 year old patient with severe CAD and chronic angina, with most recent LDL of 164.  He has tried multiple statin medications, always resulting in myalgias.   He also had a course of ezetimbe, which he was also unable to tolerate.  Will start paperwork for Repatha 140 mg every other week.  Patient is aware of high costs of this medication, but we will try to work with manufacturer for any patient assistance that could be available.  Will follow up with labs and OV 3 months after starting.

## 2016-02-14 NOTE — Progress Notes (Signed)
02/14/2016 Rhoderick Farrel [facility] 01-21-1961 [id number]   HPI:  Calvin Francis is a 55 y.o. male patient of Dr Herbie Baltimore, who presents today for a lipid clinic evaluation.  Mr. Mandato had a strong cardiac history including nonSTEMI in 2005 and 2011, CABG x 4 in 2005, cardiomyopathy, chronic angina, hypertension and pre-diabetes.  He is on disability for his cardiac issues.   Meds:  Krill oil 300 mg daily   Intolerant/previously tried:  Simvastatin 20 mg (March 05-Feb 06), Crestor 20 mg (April 06-Oct 09), simvastatin 20 mg (Nov 12-March 13).  Developed myalgias around March 2013.  Was then rechallenged with Crestor 5 mg (2013), simvastatin 20 mg (2014) and pravastatin 40 mg (2015) all of which only lasted < 1 month before myalgias forced him to stop.    Family history:  No significant family history, parents both living without cardiac disease  Diet: admits to not the best, eats eggs for breakfast most days, lunch meat sandwiches for lunch and various proteins at dinner.  Does like salad, although uses ranch dressing, likes cheeses as well  Exercise:none, gets SOB/DOE easily (cold air and humidity both make breathing harder)    Labs:  Results for TAYQUAN, GASSMAN (MRN [id number]) as of 02/14/2016 19:26  Ref. Range 01/15/2016 10:47  Cholesterol Latest Ref Range: 125-200 mg/dL 782 (H)  Triglycerides Latest Ref Range: <150 mg/dL 956 (H)  HDL Cholesterol Latest Ref Range: >=40 mg/dL 44  LDL (calc) Latest Ref Range: <130 mg/dL 213 (H)  VLDL Latest Ref Range: <30 mg/dL 34 (H)      Current Outpatient Prescriptions  Medication Sig Dispense Refill  . albuterol (PROVENTIL HFA;VENTOLIN HFA) 108 (90 BASE) MCG/ACT inhaler Inhale 2 puffs into the lungs every 4 (four) hours as needed for wheezing or shortness of breath. 1 Inhaler 0  . ALPRAZolam (XANAX) 0.25 MG tablet Take 0.25 mg by mouth as needed for sleep.    . APPLE CIDER VINEGAR PO Take by mouth.    . COLCRYS 0.6 MG tablet TAKE AS DIRECTED 2 TABLETS BY MOUTH  ON FIRST DAY THEN ONE DAILY AS NEEDED 30 tablet 0  . fluorouracil (EFUDEX) 5 % cream Apply topically 2 (two) times daily. 40 g 1  . HYDROcodone-homatropine (HYCODAN) 5-1.5 MG/5ML syrup Take 5 mLs by mouth at bedtime as needed for cough. 120 mL 0  . hydrOXYzine (ATARAX/VISTARIL) 10 MG tablet Take 10 mg by mouth 3 (three) times daily as needed.    . isosorbide dinitrate (ISORDIL) 20 MG tablet Take 1 tablet (20 mg total) by mouth 2 (two) times daily. 60 tablet 11  . Krill Oil Omega-3 300 MG CAPS Take 300 mg by mouth daily.    . lansoprazole (PREVACID SOLUTAB) 15 MG disintegrating tablet Take 15 mg by mouth daily at 12 noon.    Marland Kitchen lisinopril (PRINIVIL,ZESTRIL) 2.5 MG tablet TAKE 1 TABLET EVERY DAY 90 tablet 0  . nitroGLYCERIN (NITROSTAT) 0.4 MG SL tablet Place 1 tablet (0.4 mg total) under the tongue every 5 (five) minutes as needed for chest pain. 25 tablet 0  . omeprazole (PRILOSEC OTC) 20 MG tablet Take 20 mg by mouth daily.    . prasugrel (EFFIENT) 10 MG TABS tablet Take 1 tablet (10 mg total) by mouth daily. 14 tablet 0  . pravastatin (PRAVACHOL) 40 MG tablet Take 1 tablet (40 mg total) by mouth daily. 30 tablet 6  . predniSONE (DELTASONE) 20 MG tablet Take 3 tabs po on first day, 2 tabs second day, 2 tabs third  day, 1 tab fourth day, 1 tab 5th day. Take with food. 9 tablet 0  . ranolazine (RANEXA) 1000 MG SR tablet Take 1 tablet (1,000 mg total) by mouth twice daily, as directed. 56 tablet 0  . triamcinolone cream (KENALOG) 0.1 % Apply 1 application topically 2 (two) times daily as needed.     No current facility-administered medications for this visit.    Allergies  Allergen Reactions  . Clopidogrel Other (See Comments)    Disoriented   . Statins   . Zetia [Ezetimibe]   . Codeine Rash    Past Medical History  Diagnosis Date  . History of Non-STEMI (non-ST elevated myocardial infarction) 02/22/2004    Three-vessel disease --> referred for CABG  . 3-vessel CAD 02/22/2004    mid RCA  100% occluded, L-R collaterals; LAD 60-70% bifurcation lesion; Cx proximal 70% and mid 80% after OM1, OM1 100% occluded. --> CABG x 4  . S/P CABG x 02 February 2004    LIMA-LAD, SVG to OM 1, SVG to RCA,fRad-OM2  . Abnormal nuclear stress test August 2005    Occluded SVG-RCA  . History of Non-STEMI (non-ST elevated myocardial infarction) December 2011    Hazy lesion in SVG-OM1 --> staged PCI with 4.0 mm x 20 mm vision BMS (4.5 mm)  . CAD (coronary artery disease) of bypass graft May 2012; December 2013    Cath for angina and abnormal stress test: SVG-OM1 now occluded, attempt at PCI to the native circumflex OM1 unsuccessful; distal LAD beyond patent LIMA 70-80% (not PCI amenable) free radial-OM 2 patent; EF 45-50%  . Stable angina (HCC) 11/2012    Chronic,some what stable but still present; cardiac cath results above, no significant change from 2012  . S/P most recent cardiac catheterization December 2013    severe; now nonrevascularizable, h/o MI x3 s/p CABG and PCI (native and bypass CAD), SVG-RCA & SVG-OM1 100%; patent LIMA -LAD (severe ~70-80% distal LAD disease after LIMA) & Free Radial OM2   . Ischemic cardiomyopathy Echo 10/2012    EF ~40%; moderate Posterior HypoKinesis, minld-moderate inferior Hypokinesis; mild RV dilation, mild concentric LVH  . HLD (hyperlipidemia)     Statin intolerant  . GERD (gastroesophageal reflux disease)     ?barrett's, with esophageal dysmotility, on omeprazole  . Barrett's esophagus     s/p EGD several years ago with [place]  . Varicose veins December 2013    with venous reflux status VNUS ablation of left greater saphenous wein  . Hypertension, benign   . Former heavy cigarette smoker (20-39 per day) May 2013   . Glucose intolerance (impaired glucose tolerance)  2009    Prediabetes  . Erectile dysfunction   . Testosterone deficiency     On replacement; goal is low normal.  . Gout   . Bronchitis, mucopurulent recurrent (HCC)     Height  (1.88  m), weight 243 lb (110.224 kg).   Phillips Hay PharmD CPP [facility]

## 2016-02-19 ENCOUNTER — Encounter: Payer: Commercial Managed Care - HMO | Admitting: Vascular Surgery

## 2016-03-21 ENCOUNTER — Encounter: Payer: Self-pay | Admitting: Vascular Surgery

## 2016-03-31 ENCOUNTER — Telehealth: Payer: Self-pay | Admitting: Cardiology

## 2016-03-31 NOTE — Telephone Encounter (Signed)
New message     Patient calling the office for samples of medication:   1.  What medication and dosage are you requesting samples for? ranaxa 1000mg  and effient 10mg   2.  Are you currently out of this medication?  almost

## 2016-03-31 NOTE — Telephone Encounter (Signed)
Returned call to patient .Advised [facility].Office out of ranexa.

## 2016-04-01 ENCOUNTER — Encounter: Payer: Self-pay | Admitting: Vascular Surgery

## 2016-04-01 ENCOUNTER — Ambulatory Visit (INDEPENDENT_AMBULATORY_CARE_PROVIDER_SITE_OTHER): Payer: Commercial Managed Care - HMO | Admitting: Vascular Surgery

## 2016-04-01 VITALS — BP 136/97 | HR 95 | Ht 74.0 in | Wt 237.0 lb

## 2016-04-01 DIAGNOSIS — I83893 Varicose veins of bilateral lower extremities with other complications: Secondary | ICD-10-CM | POA: Diagnosis not present

## 2016-04-01 NOTE — Progress Notes (Signed)
Vascular and Vein Specialist of   Patient name: Calvin Francis MRN: [id number] DOB: 05/11/1961 Sex: male  REASON FOR CONSULT: Lower extremity venous pathology  HPI: Calvin Francis is a 55 y.o. male, who is seen today for evaluation of lower extremity venous pathology. He has significant history of premature cardiac disease. Underwent coronary artery bypass grafting around the age of 85. Does have a history of left leg arterial insufficiency and reports he's had a prior SFA stent on the left. His main complaints are of right leg discomfort. This is specifically around the area of his knee and he does have a popping sensation. Also reports aching sensation that seems to be worse with riding in a car and actually reports it can be to the point where he has difficulty pressing down on the gas pedal. No history of DVT. He does have marked varicosities in his left leg. Has had the saphenous vein harvest from proximal calf to his thigh on the right  Past Medical History  Diagnosis Date  . History of Non-STEMI (non-ST elevated myocardial infarction) 02/22/2004    Three-vessel disease --> referred for CABG  . 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 --> CABG x 4  . S/P CABG x 02 February 2004    LIMA-LAD, SVG to OM 1, SVG to RCA,fRad-OM2  . Abnormal nuclear stress test August 2005    Occluded SVG-RCA  . History of Non-STEMI (non-ST elevated myocardial infarction) December 2011    Hazy lesion in SVG-OM1 --> staged PCI with 4.0 mm x 20 mm vision BMS (4.5 mm)  . CAD (coronary artery disease) of bypass graft May 2012; December 2013    Cath for angina and abnormal stress test: SVG-OM1 now occluded, attempt at PCI to the native circumflex OM1 unsuccessful; distal LAD beyond patent LIMA 70-80% (not PCI amenable) free radial-OM 2 patent; EF 45-50%  . Stable angina (HCC) 11/2012   Chronic,some what stable but still present; cardiac cath results above, no significant change from 2012  . S/P most recent cardiac catheterization December 2013    severe; now nonrevascularizable, h/o MI x3 s/p CABG and PCI (native and bypass CAD), SVG-RCA & SVG-OM1 100%; patent LIMA -LAD (severe ~70-80% distal LAD disease after LIMA) & Free Radial OM2   . Ischemic cardiomyopathy Echo 10/2012    EF ~40%; moderate Posterior HypoKinesis, minld-moderate inferior Hypokinesis; mild RV dilation, mild concentric LVH  . HLD (hyperlipidemia)     Statin intolerant  . GERD (gastroesophageal reflux disease)     ?barrett's, with esophageal dysmotility, on omeprazole  . Barrett's esophagus     s/p EGD several years ago with [place]  . Varicose veins December 2013    with venous reflux status VNUS ablation of left greater saphenous wein  . Hypertension, benign   . Former heavy cigarette smoker (20-39 per day) May 2013   . Glucose intolerance (impaired glucose tolerance)  2009    Prediabetes  . Erectile dysfunction   . Testosterone deficiency     On replacement; goal is low normal.  . Gout   . Bronchitis, mucopurulent recurrent (HCC)   . Atrial fibrillation (HCC)     Family History  Problem Relation Age of Onset  . Diabetes Paternal Aunt   . Coronary artery disease Paternal Uncle   . Stroke Maternal Uncle   . Cancer Neg Hx  SOCIAL HISTORY: Social History   Social History  . Marital Status: Married    Spouse Name: N/A  . Number of Children: N/A  . Years of Education: N/A   Occupational History  . Not on file.   Social History Main Topics  . Smoking status: Heavy Tobacco Smoker -- 0.50 packs/day for 20 years    Types: Cigarettes    Last Attempt to Quit: 04/28/2012  . Smokeless tobacco: Never Used     Comment: long time smoker, smokes occasional -anxiety  . Alcohol Use: 0.0 oz/week    0 Standard drinks or equivalent per week     Comment: occasionally  . Drug Use: No  . Sexual  Activity: Not on file   Other Topics Concern  . Not on file   Social History Narrative   Caffeine: 1 cup coffee in am   Lives with wife, 2 dogs   Married, 2 Grown children, 3 grandchildren   Occupation: disability from cardiac status for last year, prior worked for [employer]   Activity: walks driveway, limited by chest pain/SOB -- is trying to pick up his activity.   Diet: good water, fruits/vegetables daily    Allergies  Allergen Reactions  . Clopidogrel Other (See Comments)    Disoriented   . Statins   . Zetia [Ezetimibe]   . Codeine Rash    Current Outpatient Prescriptions  Medication Sig Dispense Refill  . albuterol (PROVENTIL HFA;VENTOLIN HFA) 108 (90 BASE) MCG/ACT inhaler Inhale 2 puffs into the lungs every 4 (four) hours as needed for wheezing or shortness of breath. 1 Inhaler 0  . ALPRAZolam (XANAX) 0.25 MG tablet Take 0.25 mg by mouth as needed for sleep.    . APPLE CIDER VINEGAR PO Take by mouth.    . COLCRYS 0.6 MG tablet TAKE AS DIRECTED 2 TABLETS BY MOUTH ON FIRST DAY THEN ONE DAILY AS NEEDED 30 tablet 0  . fluorouracil (EFUDEX) 5 % cream Apply topically 2 (two) times daily. 40 g 1  . HYDROcodone-homatropine (HYCODAN) 5-1.5 MG/5ML syrup Take 5 mLs by mouth at bedtime as needed for cough. 120 mL 0  . hydrOXYzine (ATARAX/VISTARIL) 10 MG tablet Take 10 mg by mouth 3 (three) times daily as needed.    . isosorbide dinitrate (ISORDIL) 20 MG tablet Take 1 tablet (20 mg total) by mouth 2 (two) times daily. 60 tablet 11  . Krill Oil Omega-3 300 MG CAPS Take 300 mg by mouth daily.    . lansoprazole (PREVACID SOLUTAB) 15 MG disintegrating tablet Take 15 mg by mouth daily at 12 noon.    Marland Kitchen lisinopril (PRINIVIL,ZESTRIL) 2.5 MG tablet TAKE 1 TABLET EVERY DAY 90 tablet 0  . nitroGLYCERIN (NITROSTAT) 0.4 MG SL tablet Place 1 tablet (0.4 mg total) under the tongue every 5 (five) minutes as needed for chest pain. 25 tablet 0  . omeprazole (PRILOSEC OTC) 20 MG tablet Take 20 mg  by mouth daily.    . prasugrel (EFFIENT) 10 MG TABS tablet Take 1 tablet (10 mg total) by mouth daily. 14 tablet 0  . pravastatin (PRAVACHOL) 40 MG tablet Take 1 tablet (40 mg total) by mouth daily. 30 tablet 6  . predniSONE (DELTASONE) 20 MG tablet Take 3 tabs po on first day, 2 tabs second day, 2 tabs third day, 1 tab fourth day, 1 tab 5th day. Take with food. 9 tablet 0  . ranolazine (RANEXA) 1000 MG SR tablet Take 1 tablet (1,000 mg total) by mouth twice daily, as directed.  56 tablet 0  . triamcinolone cream (KENALOG) 0.1 % Apply 1 application topically 2 (two) times daily as needed.     No current facility-administered medications for this visit.    REVIEW OF SYSTEMS:   denotes positive finding,  denotes negative finding Cardiac  Comments:  Chest pain or chest pressure: x   Shortness of breath upon exertion: x   Short of breath when lying flat:    Irregular heart rhythm:        Vascular    Pain in calf, thigh, or hip brought on by ambulation: x   Pain in feet at night that wakes you up from your sleep:  x   Blood clot in your veins:    Leg swelling:  x       Pulmonary    Oxygen at home:    Productive cough:     Wheezing:         Neurologic    Sudden weakness in arms or legs:  x   Sudden numbness in arms or legs:  x   Sudden onset of difficulty speaking or slurred speech:    Temporary loss of vision in one eye:     Problems with dizziness:  x       Gastrointestinal    Blood in stool:     Vomited blood:         Genitourinary    Burning when urinating:     Blood in urine:        Psychiatric    Major depression:         Hematologic    Bleeding problems: x   Problems with blood clotting too easily:        Skin    Rashes or ulcers: x       Constitutional    Fever or chills:      PHYSICAL EXAM: Filed Vitals:   04/01/16 1015  BP: 136/97  Pulse: 95  Height:  (1.88 m)  Weight: 237 lb (107.502 kg)  SpO2: 98%    GENERAL: The patient is a  well-nourished male, in no acute distress. The vital signs are documented above. CARDIAC: There is a regular rate and rhythm.  VASCULAR: 2+ radial and 2+ left dorsalis pedis pulse. 1+ right dorsalis pedis pulse PULMONARY: There is good air exchange bilaterally without wheezing or rales. ABDOMEN: Soft and non-tender with normal pitched bowel sounds.  MUSCULOSKELETAL: There are no major deformities or cyanosis. NEUROLOGIC: No focal weakness or paresthesias are detected. SKIN: There are no ulcers or rashes noted. PSYCHIATRIC: The patient has a normal affect. Does have a long scar on his medial left leg from vein harvest. Does have extensive telangiectasia over his distal ankle and onto his foot on the right. Small varicosities.  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 On the left he does have reflux throughout his saphenous vein. He does have a very large branch that arises in his proximal thigh and is subcutaneous throughout the length of his thigh on to his calf he has reflux in his common femoral vein bilaterally but no other deep venous reflux  MEDICAL ISSUES: Discussed the significance of his venous pathology with the patient. I do not see any correctable venous issues  regarding his right leg which is his most symptomatic. He does not have any significant arterial disease. Explained that this may potentially be orthopedic. Regarding his left leg he does have marked superficial reflux which would be correctable with ablation. I would not recommend this currently since he does not have severe symptoms. Also although he does have marked dilatation in the tributary branch, it appears that his saphenous vein itself has mild dilatation and would in all likelihood be an  acceptable conduit for coronary bypass if this is required in the future. Would not normally recommend saving saphenous vein for this but with his extensive premature cardiac disease, this may be important for him. I did recommend left leg knee-high compression garments, 20-30 mmHg. He feels that he has an old pair at home and will look for these. If not he will contact us for fitting. I explained that this will make a great deal difference in the future if he is able to be compliant with these currently. He understands and will see us again on an as-needed basis   Early, Todd [facility] Specialists [facility] [place] [facility]: [phone]

## 2016-04-07 ENCOUNTER — Telehealth: Payer: Self-pay | Admitting: Cardiology

## 2016-04-07 NOTE — Telephone Encounter (Signed)
New message       Patient calling the office for samples of medication:   1.  What medication and dosage are you requesting samples for? ranexa 1000  2.  Are you currently out of this medication? almost

## 2016-04-08 NOTE — Telephone Encounter (Signed)
Patient notified no samples of ranexa is available. He is OK right now, he is not in the doughnut hole. Patient says he will get a prescription thru [facility]  Patient states he can get the medication from [place] for cheaper for out of pocket.

## 2016-04-23 ENCOUNTER — Other Ambulatory Visit: Payer: Self-pay | Admitting: *Deleted

## 2016-04-23 MED ORDER — COLCHICINE 0.6 MG PO TABS: ORAL_TABLET | ORAL | Status: DC

## 2016-04-25 ENCOUNTER — Other Ambulatory Visit: Payer: Self-pay | Admitting: Cardiology

## 2016-04-25 NOTE — Telephone Encounter (Signed)
Rx(s) sent to pharmacy electronically.  

## 2016-05-05 ENCOUNTER — Other Ambulatory Visit: Payer: Self-pay | Admitting: *Deleted

## 2016-05-05 MED ORDER — COLCHICINE 0.6 MG PO TABS: ORAL_TABLET | ORAL | Status: DC

## 2016-05-17 NOTE — Progress Notes (Signed)
Opened in error

## 2016-05-19 ENCOUNTER — Other Ambulatory Visit: Payer: Self-pay | Admitting: Cardiology

## 2016-05-19 NOTE — Telephone Encounter (Signed)
Patient calling the office for samples of medication:   1.  What medication and dosage are you requesting samples for? Ranexa 1000mg  and Effient 10mg    2.  Are you currently out of this medication? Yes

## 2016-05-19 NOTE — Telephone Encounter (Signed)
Medication samples have been provided to the patient.  Drug name: Ranexa 500 mg Qty: 42 tabs LOT: [id number] Exp.Date: 01/2019 *patient aware to take 2 tablets twice daily Drug name: Effient 10 mg Qty: 14 tabs LOT: [id number] Exp.Date: 08/2016  Samples left at front desk for patient pick-up. Patient notified.  Truitt, Chelley 4:04 PM 05/19/2016

## 2016-06-26 ENCOUNTER — Telehealth: Payer: Self-pay | Admitting: Cardiology

## 2016-06-26 NOTE — Telephone Encounter (Signed)
Medication samples have been provided to the patient.  Drug name: ranexa 1000mg   Qty: 56 (4 packs)  LOT: [id number]  Exp.Date: 03/2019  Samples left at front desk for patient pick-up. Patient notified - aware no effient samples are available  Patient has Medicare? and is in the donut hole  Lindell Sparlkins, Phenix Grein M 9:48 AM 06/26/2016

## 2016-06-26 NOTE — Telephone Encounter (Signed)
Patient calling the office for samples of medication:   1.  What medication and dosage are you requesting samples for? Effient, Ranexa,   2.  Are you currently out of this medication? no

## 2016-07-24 ENCOUNTER — Other Ambulatory Visit: Payer: Self-pay | Admitting: *Deleted

## 2016-07-24 MED ORDER — RANOLAZINE ER 1000 MG PO TB12: ORAL_TABLET | ORAL | 1 refills | Status: DC

## 2016-07-24 MED ORDER — PRASUGREL HCL 10 MG PO TABS: 10.0000 mg | ORAL_TABLET | Freq: Every day | ORAL | 1 refills | Status: DC

## 2016-08-06 ENCOUNTER — Telehealth: Payer: Self-pay | Admitting: Cardiology

## 2016-08-06 MED ORDER — PRASUGREL HCL 10 MG PO TABS: 10.0000 mg | ORAL_TABLET | Freq: Every day | ORAL | 3 refills | Status: DC

## 2016-08-06 NOTE — Telephone Encounter (Addendum)
Returned patient call-made aware that office out of samples of Ranexa and Effient at this time.  Pt verbalized understanding.  Pt also states that he had paperwork faxed from [facility] mail order to verify rx for Effient so that he can start to get the medication through them.  Will attempt to locate paperwork and f/u.

## 2016-08-06 NOTE — Telephone Encounter (Signed)
Rx sent to [facility] ([facility]) for Effient 10mg .    Pt made aware.

## 2016-08-06 NOTE — Telephone Encounter (Signed)
Patient calling the office for samples of medication:   1.  What medication and dosage are you requesting samples for? Ranexa and Effient   2.  Are you currently out of this medication? Have a few left

## 2016-08-13 ENCOUNTER — Other Ambulatory Visit: Payer: Self-pay

## 2016-08-13 MED ORDER — PRASUGREL HCL 10 MG PO TABS: 10.0000 mg | ORAL_TABLET | Freq: Every day | ORAL | 0 refills | Status: DC

## 2016-08-29 ENCOUNTER — Other Ambulatory Visit: Payer: Self-pay | Admitting: Cardiology

## 2016-08-29 NOTE — Telephone Encounter (Signed)
New message ° ° ° ° ° °Patient calling the office for samples of medication: ° ° °1.  What medication and dosage are you requesting samples for? ranexa 1000mg ° °2.  Are you currently out of this medication? Almost out ° ° °

## 2016-09-02 NOTE — Telephone Encounter (Signed)
Medication samples have been provided to the patient.  Drug name: ranexa 1000mg   Qty: 42 tablets (3 packs)  LOT: [id number]  Exp.Date: 03/2019  Samples left at front desk for patient pick-up. LM w/info above  Lindell Sparlkins, Chauntelle Azpeitia M 12:15 PM 09/02/2016

## 2016-09-11 ENCOUNTER — Ambulatory Visit (INDEPENDENT_AMBULATORY_CARE_PROVIDER_SITE_OTHER): Payer: Commercial Managed Care - HMO | Admitting: Cardiology

## 2016-09-11 ENCOUNTER — Encounter: Payer: Self-pay | Admitting: Cardiology

## 2016-09-11 VITALS — BP 122/96 | HR 77 | Ht 74.0 in | Wt 235.6 lb

## 2016-09-11 DIAGNOSIS — Z889 Allergy status to unspecified drugs, medicaments and biological substances status: Secondary | ICD-10-CM

## 2016-09-11 DIAGNOSIS — F17200 Nicotine dependence, unspecified, uncomplicated: Secondary | ICD-10-CM

## 2016-09-11 DIAGNOSIS — I208 Other forms of angina pectoris: Secondary | ICD-10-CM | POA: Diagnosis not present

## 2016-09-11 DIAGNOSIS — I25119 Atherosclerotic heart disease of native coronary artery with unspecified angina pectoris: Secondary | ICD-10-CM

## 2016-09-11 DIAGNOSIS — Z951 Presence of aortocoronary bypass graft: Secondary | ICD-10-CM

## 2016-09-11 DIAGNOSIS — I10 Essential (primary) hypertension: Secondary | ICD-10-CM

## 2016-09-11 DIAGNOSIS — E785 Hyperlipidemia, unspecified: Secondary | ICD-10-CM

## 2016-09-11 DIAGNOSIS — I25708 Atherosclerosis of coronary artery bypass graft(s), unspecified, with other forms of angina pectoris: Secondary | ICD-10-CM

## 2016-09-11 DIAGNOSIS — I2510 Atherosclerotic heart disease of native coronary artery without angina pectoris: Secondary | ICD-10-CM | POA: Diagnosis not present

## 2016-09-11 DIAGNOSIS — I83893 Varicose veins of bilateral lower extremities with other complications: Secondary | ICD-10-CM | POA: Diagnosis not present

## 2016-09-11 DIAGNOSIS — Z720 Tobacco use: Secondary | ICD-10-CM

## 2016-09-11 DIAGNOSIS — Z789 Other specified health status: Secondary | ICD-10-CM

## 2016-09-11 DIAGNOSIS — I255 Ischemic cardiomyopathy: Secondary | ICD-10-CM

## 2016-09-11 NOTE — Patient Instructions (Addendum)
No changes with current medications   Will have pharmacist contact you in regards to following up about Repatha Please have lab work -CMP , LIPID---done - in case an appointment is needed - ( PT STATES HE WILL HAVE LAB WORK AT PRIMARY -09/29/16)   Your physician wants you to follow-up in: 6 MONTH WITH DR HARDING.You will receive a reminder letter in the mail two months in advance. If you don't receive a letter, please call our office to schedule the follow-up appointment.  If you need a refill on your cardiac medications before your next appointment, please call your pharmacy.  ( CALL OFFICE WHEN YOU WANT A WRITTEN PRESCRIPTIONS )

## 2016-09-11 NOTE — Progress Notes (Signed)
PCP: Ria Bush, MD  Clinic Note: Chief Complaint  Patient presents with  . Follow-up    6 m f/u  . Chest Pain    SOB   HPI: Calvin Francis is a 55 y.o. male with a PMH below who presents today for 6 month f/u of chronic CAD. He was a former patient of Dr. Chase Picket, who I first met back in December 2011 when he presented with a Non-STEMI w/ staged PCI of SVG-OM1 with a BMS. He has had several catheterizations and several stress tests since then:  occluded SVG-OM1 prior to the stent,   occluded SVG-RCA with occluded native RCA.   Patent LIMA-LAD with a distal LAD 70% lesion not amenable to PCI  Patent Radial Graft to OM2  An attempt was made to intervene on the native circumflex which was unsuccessful. His lipid control is very poor because of statin and other medication intolerance.  Problem List 1. Stable angina: Class I-II   2. Varicose veins of leg with swelling, bilateral   3. Smoker   4. S/P CABG x 4   5. Ischemic cardiomyopathy   6. Hyperlipidemia with target LDL less than 70 with statin related symptoms   7. Atherosclerosis of native coronary artery of native heart with angina pectoris ([place])   8. Coronary artery disease involving coronary bypass graft with other forms of angina pectoris ([place])   9. 3-vessel CAD   10. Essential hypertension   11. Statin intolerance    Calvin Francis was last seen on January 2017-- He was supposed to been scheduled for sleep study evaluation by Dr. Claiborne Billings as well as venous Dopplers and consider referral to vascular surgery.  He saw Tommy Medal, Pharm.D back in February to try to start evaluation for PCSK9 inhibitor. I think the focal loading cost of this put him off significantly, he is already dealing with doughnut hole issues with Effient and Ranexa.  Recent Hospitalizations: None  Studies Reviewed: None  Interval History: Calvin Francis presents today really pretty much in his usual state of health. He still has his  chronic stable angina if he does any significant exertion or walks up an incline, stairs etc. But with routine walking around the house and walking he actually does pretty well on the current dose of Ranexa. On occasion he'll take an additional Ranexa, but has been really trying to stretch out his Ranexa to taking it just once a days post twice a day. He has been actually trying to increase his level of activity in an effort to try to improve his symptoms. He's been a little limited as having some significant pain in his right knee.  He has intermittent episodes that may or may not related to activity where he feels his heart rate goes up a little bit and it makes it harder to breathe and he feels a tight pressure in his chest. Will happen maybe once or twice a week. It also associated when he's feeling a little stressed. That is when he would maybe take an additional Ranexa plus or minus And that usually makes it go away.  He is currently taking his Effient every other day since he is quite far out from a PCI. We are using this is secondary prevention. He has Isordil listed, but is not taking it.  He really denies any significant exertional dyspnea unless his angina kicks in, no heart failure symptoms of PND, orthopnea with only minimal edema He does have pretty significant varicose  veins despite having a prior ablation procedure, he has varicose veins all the way up his thighs. These are somewhat painful and uncomfortable. He has some cramping sensations as well -- despite this, he didn't want to go through the evaluation for possible procedures  Remainder of Cardiovascular Review of Symptoms:  No palpitations, lightheadedness, dizziness, weakness or syncope/near syncope.  No TIA/amaurosis fugax symptoms.  No melena, hematochezia, hematuria, or epstaxis.  No claudication.  ROS: A comprehensive was performed. Review of Systems  Constitutional: Positive for malaise/fatigue (He does note some  fatigue associated medications.).  HENT: Negative for ear discharge and nosebleeds.   Eyes: Negative for blurred vision.  Respiratory: Negative for cough, shortness of breath and wheezing.   Cardiovascular: Positive for orthopnea (baseline).       He is noting some issues with erectile dysfunction. He has been using 1/4th of a 100 mg Viagra tablet with some success.  Was trying it out & may ask for an Rx.  Gastrointestinal: Negative for abdominal pain, constipation, diarrhea and melena.  Genitourinary: Negative for dysuria and hematuria.  Musculoskeletal: Positive for joint pain (Right knee pain). Negative for falls, myalgias and neck pain.  Neurological: Negative for dizziness, focal weakness, loss of consciousness and headaches.  Endo/Heme/Allergies: Bruises/bleeds easily (Only really bounce himself.).  Psychiatric/Behavioral: Positive for depression (Axis is been a pretty good state of mind. Not really depressed now.). Negative for memory loss. The patient has insomnia (has a hard time getting to sleep and then cannot get back to sleep).     Past Medical History:  Diagnosis Date  . 3-vessel CAD 02/22/2004   mid RCA 100% occluded, L-R collaterals; LAD 60-70% bifurcation lesion; Cx proximal 70% and mid 80% after OM1, OM1 100% occluded. --> CABG x 4  . Abnormal nuclear stress test August 2005   Occluded SVG-RCA  . Atrial fibrillation ([place])   . Barrett's esophagus    s/p EGD several years ago with [facility]  . Bronchitis, mucopurulent recurrent ([place])   . CAD (coronary artery disease) of bypass graft May 2012; December 2013   Cath for angina and abnormal stress test: SVG-OM1 now occluded, attempt at PCI to the native circumflex OM1 unsuccessful; distal LAD beyond patent LIMA 70-80% (not PCI amenable) free radial-OM 2 patent; EF 45-50%  . Erectile dysfunction   . Former heavy cigarette smoker (20-39 per day) May 2013   . GERD (gastroesophageal reflux disease)    ?barrett's, with esophageal  dysmotility, on omeprazole  . Glucose intolerance (impaired glucose tolerance)  2009   Prediabetes  . Gout   . History of Non-STEMI (non-ST elevated myocardial infarction) 02/22/2004   Three-vessel disease --> referred for CABG  . History of Non-STEMI (non-ST elevated myocardial infarction) December 2011   Hazy lesion in SVG-OM1 --> staged PCI with 4.0 mm x 20 mm vision BMS (4.5 mm)  . HLD (hyperlipidemia)    Statin intolerant  . Hypertension, benign   . Ischemic cardiomyopathy Echo 10/2012   EF ~40%; moderate Posterior HypoKinesis, minld-moderate inferior Hypokinesis; mild RV dilation, mild concentric LVH  . 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 S/P most recent cardiac catheterization December 2013   severe; now nonrevascularizable, h/o MI x3 s/p CABG and PCI (native and bypass CAD), SVG-RCA & SVG-OM1 100%; patent LIMA -LAD (severe ~70-80% distal LAD disease after LIMA) & Free Radial OM2   . Stable angina ([place]) 11/2012   Chronic,some what stable but still present;  cardiac cath results above, no significant change from 2012  . Testosterone deficiency    On replacement; goal is low normal.  . Varicose veins December 2013   with venous reflux status VNUS ablation of left greater saphenous wein    Past Surgical History:  Procedure Laterality Date  . CARDIAC CATHETERIZATION  May 01 2011   knowwn occlusion of SVG to RCA  as well as native RCA ; PCI to the SVG to OM1 ;patent LIMA to LAD with diffuse distal  LAD 70-80%,small  vessel dx not thought to be amenable to PCI .RCA 100% occluded ,OM2 patent,Circ diseased aftr OM1. EF 45-50%  . CARDIAC CATHETERIZATION  12/27/2012  . CORONARY ANGIOPLASTY WITH STENT PLACEMENT  12/25/2010   s/p autologous vessel blockage  . CORONARY ARTERY BYPASS GRAFT  02/27/04   LIMA -LAD, Free Radial-OM2 -- patent; SVG-OM1, SVG-RCA, - 100% occluded by recent cath  . ESOPHAGOGASTRODUODENOSCOPY    . LEFT HEART CATHETERIZATION WITH  CORONARY/GRAFT ANGIOGRAM N/A 12/27/2012   Procedure: LEFT HEART CATHETERIZATION WITH Beatrix Fetters;  Surgeon: Leonie Man, MD;  Location: [facility] CATH LAB;  Service: Cardiovascular;  Laterality: N/A;  . lower venous extremity doppler Left 08/15/2011   normal ,s/p vein ablation  . MET/CPET  11/02/2012   suboptimal effort but peak VO2  was 52% which relatively significant. Myoview was suggestive for ant. ischemia may go along with his distal LAD   . NM MYOCAR PERF WALL MOTION  12/15/2012   high risk study  . US ECHOCARDIOGRAPHY  09/2012   mild LVH, EF 40%, mod posterior and inf wall hypokinesis,mild concentric LVH;; RV dilated, normal  fx.trace MR  . VEIN SURGERY  2011   [facility] - h/o vericose veins    Prior to Admission medications   Medication Sig Start Date End Date Taking? Authorizing Provider  albuterol (PROVENTIL HFA;VENTOLIN HFA) 108 (90 BASE) MCG/ACT inhaler Inhale 2 puffs into the lungs every 4 (four) hours as needed for wheezing or shortness of breath. 11/24/15  Yes Janne Napoleon, NP  ALPRAZolam Duanne Moron) 0.25 MG tablet Take 0.25 mg by mouth as needed for sleep.   Yes Historical Provider, MD  APPLE CIDER VINEGAR PO Take by mouth.   Yes Historical Provider, MD  COLCRYS 0.6 MG tablet TAKE AS DIRECTED 2 TABLETS BY MOUTH ON FIRST DAY THEN ONE DAILY AS NEEDED 09/20/14  Yes Ria Bush, MD  fluorouracil (EFUDEX) 5 % cream Apply topically 2 (two) times daily. 11/07/14  Yes [place], DPM  HYDROcodone-homatropine (HYCODAN) 5-1.5 MG/5ML syrup Take 5 mLs by mouth at bedtime as needed for cough. 11/29/15  Yes Amy Cletis [place], MD  hydrOXYzine (ATARAX/VISTARIL) 10 MG tablet Take 10 mg by mouth 3 (three) times daily as needed.   Yes Historical Provider, MD  isosorbide dinitrate (ISORDIL) 20 MG tablet Take 1 tablet (20 mg total) by mouth 2 (two) times daily. 06/26/15  Yes Leonie Man, MD  [name] Oil Omega-3 300 MG CAPS Take 300 mg by mouth daily.   Yes Historical Provider, MD    lansoprazole (PREVACID SOLUTAB) 15 MG disintegrating tablet Take 15 mg by mouth daily at 12 noon.   Yes Historical Provider, MD  lisinopril (PRINIVIL,ZESTRIL) 2.5 MG tablet TAKE 1 TABLET EVERY DAY 12/05/15  Yes Leonie Man, MD  nitroGLYCERIN (NITROSTAT) 0.4 MG SL tablet Place 1 tablet (0.4 mg total) under the tongue every 5 (five) minutes as needed for chest pain. 11/29/15  Yes Amy Cletis [place], MD  omeprazole (PRILOSEC OTC) 20  MG tablet Take 20 mg by mouth daily.   Yes Historical Provider, MD  prasugrel (EFFIENT) 10 MG TABS tablet Take 1 tablet (10 mg total) by mouth daily. 10/26/15  Yes Leonie Man, MD  pravastatin (PRAVACHOL) 40 MG tablet Take 1 tablet (40 mg total) by mouth daily. 12/11/14  Yes Ria Bush, MD  predniSONE (DELTASONE) 20 MG tablet Take 3 tabs po on first day, 2 tabs second day, 2 tabs third day, 1 tab fourth day, 1 tab 5th day. Take with food. 11/24/15  Yes Janne Napoleon, NP  ranolazine (RANEXA) 1000 MG SR tablet Take 1 tablet (1,000 mg total) by mouth twice daily, as directed. 09/04/15  Yes Leonie Man, MD  triamcinolone cream (KENALOG) 0.1 %Dictation #1 [id number]  [id number]  Apply 1 application topically 2 (two) times daily as needed.   Yes Historical Provider, MD   Allergies  Allergen Reactions  . Clopidogrel Other (See Comments)    Disoriented   . Statins   . Zetia [Ezetimibe]   . Codeine Rash     Social History   Social History  . Marital status: Married    Spouse name: N/A  . Number of children: N/A  . Years of education: N/A   Social History Main Topics  . Smoking status: Heavy Tobacco Smoker    Packs/day: 0.50    Years: 20.00    Types: Cigarettes    Last attempt to quit: 04/28/2012  . Smokeless tobacco: Never Used     Comment: long time smoker, smokes occasional -anxiety  . Alcohol use 0.0 oz/week     Comment: occasionally  . Drug use: No  . Sexual activity: Not Asked   Other Topics Concern  . None   Social History Narrative    Caffeine: 1 cup coffee in am   Lives with wife, 2 dogs   Married, 2 Grown children, 3 grandchildren   Occupation: disability from cardiac status for last year, prior worked for [employer]   Activity: walks driveway, limited by chest pain/SOB -- is trying to pick up his activity.   Diet: good water, fruits/vegetables daily   Family History  Problem Relation Age of Onset  . Diabetes Paternal Aunt   . Coronary artery disease Paternal Uncle   . Stroke Maternal Uncle   . Cancer Neg Hx     Wt Readings from Last 3 Encounters:  09/11/16 235 lb 9.6 oz (106.9 kg)  04/01/16 237 lb (107.5 kg)  02/14/16 243 lb (110.2 kg)    PHYSICAL EXAM BP (!) 122/96   Pulse 77   Ht 6' 2"  (1.88 m)   Wt 235 lb 9.6 oz (106.9 kg)   BMI 30.25 kg/m  General appearance: alert, cooperative, appears stated age, no distress and mildly obese; pleasant mood and affect HEENT: [place]/AT, EOMI, MMM, anicteric sclera Neck: no adenopathy, no carotid bruit and no JVD Lungs: clear to auscultation bilaterally, normal percussion bilaterally and non-labored Heart: RRR, normal S1/S2, no M./R./G. Nondisplaced PMI. Abdomen: soft, non-tender; bowel sounds normal; no masses, no organomegaly; no HJR Extremities: extremities normal, atraumatic, no cyanosis, and edema  Pulses: 2+ and symmetric; no edema Neurologic: Mental status: Alert, oriented, thought content appropriate Cranial nerves: normal (II-XII grossly intact)    Adult ECG Report  Rate: 83 ;  Rhythm: normal sinus rhythm and Mild nonspecific T-wave inversion in low voltage aVL. Otherwise normal axis, intervals and durations. Low voltage in limb leads;   Narrative Interpretation: Stable, non-ischemic EKG   Other studies Reviewed:  Additional studies/ records that were reviewed today include:  Recent Labs:   Lab Results  Component Value Date   CHOL 242 (H) 01/15/2016   HDL 44 01/15/2016   LDLCALC 164 (H) 01/15/2016   LDLDIRECT 119.0 08/17/2013   TRIG 172 (H)  01/15/2016   CHOLHDL 5.5 (H) 01/15/2016    ASSESSMENT / PLAN: Problem List Items Addressed This Visit    Varicose veins of leg with swelling (Chronic)    Stable. Wears compression stockings when not hot. Reportedly, he did not go forward with it having a Dopplers done or with his vascular surgery referral. Can discuss in follow-up.      Relevant Orders   EKG 12-Lead (Completed)   Lipid panel   Comprehensive metabolic panel   Statin intolerance (Chronic)   Relevant Orders   EKG 12-Lead (Completed)   Lipid panel   Comprehensive metabolic panel   Stable angina: Class I-II - Primary (Chronic)    I think we have this about as well controlled as we possibly can. He just does not tolerate nitrates. I would love to be aortic have him take Ranexa twice a day, but simply financially this is not doable for him. At present he is using the second dose on a when necessary basis. We will provide him some samples today. In the past, we had taken him off beta blockers secondary to fatigue. He is only on low-dose lisinopril. Could consider amlodipine if blood pressure would tolerate.  If symptoms progress, we may need to consider relook catheterization to consider possibility of CTO treatment. At present, I think we are doing okay unless his symptoms worsen.      Relevant Orders   EKG 12-Lead (Completed)   Lipid panel   Comprehensive metabolic panel   Smoker    Very light smoker. Usually dealing with stress. We've talked about him fully quitting, but he dates it is really not a very routine activity for him. We talked about possibly having other trigger techniques or using a patch/vapor cigarettes.      Relevant Orders   EKG 12-Lead (Completed)   Lipid panel   Comprehensive metabolic panel   S/P CABG x 4   Relevant Orders   EKG 12-Lead (Completed)   Lipid panel   Comprehensive metabolic panel   Ischemic cardiomyopathy (Chronic)    No heart failure symptoms besides short of breath going up  hill which may be more related to ischemic diastolic dysfunction. He is able tolerate low-dose ACE inhibitor, if his pressure continues to be higher, we can potentially increase this. He appears to be Euvolemic, has not been on a diuretic.      Relevant Orders   EKG 12-Lead (Completed)   Lipid panel   Comprehensive metabolic panel   Hyperlipidemia with target LDL less than 70 with statin related symptoms (Chronic)    He has significant multivessel disease at age 26 with already occluded grafts and native vessels. We have not been able to get his lipids under control. He started the process of the lipid clinic, but was curetted away by cost. I have our pharmacy team contact him back to see if we can potentially start paperwork for pharmaceutical company based financial support for PCSK9 inhibitor. While doing so they will also look to see this any additional potential support for Effient plus or minus Ranexa..      Relevant Orders   EKG 12-Lead (Completed)   Lipid panel   Comprehensive metabolic panel   Essential hypertension (Chronic)  Still has mildly elevated diastolic pressure. All stable. May need to consider increasing lisinopril dose.      Relevant Orders   EKG 12-Lead (Completed)   Lipid panel   Comprehensive metabolic panel   CAD (coronary artery disease) of bypass graft (Chronic)    Long-standing, significant graft disease with occluded vein graft to OM and occluded native circumflex-OM. Patent LIMA but downstream LAD disease not PCI amenable.  At present, no plans for relook catheterization. I also don't think we necessarily. Consider reduce stress testing unless symptoms warrant. If he were to have increased symptoms are concerning for worsening angina, would probably skip stress test and go straight to cath      Relevant Orders   EKG 12-Lead (Completed)   Lipid panel   Comprehensive metabolic panel   Atherosclerotic heart disease of native coronary artery with angina  pectoris (HCC) (Chronic)    Remains on Effient. We talked about potentially switching to Plavix, but I think he had some issues with tolerance. Perhaps a financial incentive may be one reason to switch, but for now doing Effient every other day.  Not on beta blocker secondary to fatigue. Not on statin secondary to significant intolerance.      Relevant Orders   EKG 12-Lead (Completed)   Lipid panel   Comprehensive metabolic panel   3-vessel CAD (Chronic)   Relevant Orders   EKG 12-Lead (Completed)   Lipid panel   Comprehensive metabolic panel    Other Visit Diagnoses   None.     Due to his multiple cardiovascular issues, over 40 minutes was spent in direct with the patient. Greater than 50% of the time was spent in direct consultation.    Current medicines are reviewed at length with the patient today. (+/- concerns) none currenlty The following changes have been made:    Studies Ordered:   Orders Placed This Encounter  Procedures  . Lipid panel  . Comprehensive metabolic panel  . EKG 12-Lead     ROV 6 months --    Glenetta Hew, M.D., M.S. Interventional Cardiologist   Pager # [phone]

## 2016-09-13 ENCOUNTER — Encounter: Payer: Self-pay | Admitting: Cardiology

## 2016-09-13 NOTE — Assessment & Plan Note (Signed)
Still has mildly elevated diastolic pressure. All stable. May need to consider increasing lisinopril dose.

## 2016-09-13 NOTE — Assessment & Plan Note (Signed)
Very light smoker. Usually dealing with stress. We've talked about him fully quitting, but he dates it is really not a very routine activity for him. We talked about possibly having other trigger techniques or using a patch/vapor cigarettes.

## 2016-09-13 NOTE — Assessment & Plan Note (Addendum)
Stable. Wears compression stockings when not hot. Reportedly, he did not go forward with it having a Dopplers done or with his vascular surgery referral. Can discuss in follow-up.

## 2016-09-13 NOTE — Assessment & Plan Note (Signed)
I think we have this about as well controlled as we possibly can. He just does not tolerate nitrates. I would love to be aortic have him take Ranexa twice a day, but simply financially this is not doable for him. At present he is using the second dose on a when necessary basis. We will provide him some samples today. In the past, we had taken him off beta blockers secondary to fatigue. He is only on low-dose lisinopril. Could consider amlodipine if blood pressure would tolerate.  If symptoms progress, we may need to consider relook catheterization to consider possibility of CTO treatment. At present, I think we are doing okay unless his symptoms worsen.

## 2016-09-13 NOTE — Assessment & Plan Note (Signed)
Long-standing, significant graft disease with occluded vein graft to OM and occluded native circumflex-OM. Patent LIMA but downstream LAD disease not PCI amenable.  At present, no plans for relook catheterization. I also don't think we necessarily. Consider reduce stress testing unless symptoms warrant. If he were to have increased symptoms are concerning for worsening angina, would probably skip stress test and go straight to cath

## 2016-09-13 NOTE — Assessment & Plan Note (Signed)
No heart failure symptoms besides short of breath going up hill which may be more related to ischemic diastolic dysfunction. He is able tolerate low-dose ACE inhibitor, if his pressure continues to be higher, we can potentially increase this. He appears to be Euvolemic, has not been on a diuretic.

## 2016-09-13 NOTE — Assessment & Plan Note (Signed)
Remains on Effient. We talked about potentially switching to Plavix, but I think he had some issues with tolerance. Perhaps a financial incentive may be one reason to switch, but for now doing Effient every other day.  Not on beta blocker secondary to fatigue. Not on statin secondary to significant intolerance.

## 2016-09-13 NOTE — Assessment & Plan Note (Signed)
He has significant multivessel disease at age 55 with already occluded grafts and native vessels. We have not been able to get his lipids under control. He started the process of the lipid clinic, but was curetted away by cost. I have our pharmacy team contact him back to see if we can potentially start paperwork for pharmaceutical company based financial support for PCSK9 inhibitor. While doing so they will also look to see this any additional potential support for Effient plus or minus Ranexa..Marland Kitchen

## 2016-09-26 ENCOUNTER — Telehealth: Payer: Self-pay

## 2016-09-26 NOTE — Telephone Encounter (Signed)
Attempted to reach pt to discuss recent lab orders from Dr. Herbie BaltimoreHarding.   If patient returns phone call, [facility]. If Virl AxeLesia is unavailable, please ask the patient if he went to lab to have Lipid Panel and Comprehensive Metabolic Panel labs completed on 09/11/16. Then please forward that information to Dr. Sharen HonesGutierrez.

## 2016-09-27 ENCOUNTER — Other Ambulatory Visit: Payer: Self-pay | Admitting: Family Medicine

## 2016-09-27 DIAGNOSIS — Z125 Encounter for screening for malignant neoplasm of prostate: Secondary | ICD-10-CM

## 2016-09-27 DIAGNOSIS — R7303 Prediabetes: Secondary | ICD-10-CM

## 2016-09-27 DIAGNOSIS — I255 Ischemic cardiomyopathy: Secondary | ICD-10-CM

## 2016-09-27 DIAGNOSIS — Z114 Encounter for screening for human immunodeficiency virus [HIV]: Secondary | ICD-10-CM

## 2016-09-27 DIAGNOSIS — Z1159 Encounter for screening for other viral diseases: Secondary | ICD-10-CM

## 2016-09-27 DIAGNOSIS — E785 Hyperlipidemia, unspecified: Secondary | ICD-10-CM

## 2016-09-29 ENCOUNTER — Telehealth: Payer: Self-pay

## 2016-09-29 ENCOUNTER — Ambulatory Visit (INDEPENDENT_AMBULATORY_CARE_PROVIDER_SITE_OTHER): Payer: Commercial Managed Care - HMO

## 2016-09-29 VITALS — BP 122/80 | HR 83 | Temp 98.4°F | Ht 73.25 in | Wt 236.0 lb

## 2016-09-29 DIAGNOSIS — Z23 Encounter for immunization: Secondary | ICD-10-CM

## 2016-09-29 DIAGNOSIS — I255 Ischemic cardiomyopathy: Secondary | ICD-10-CM

## 2016-09-29 DIAGNOSIS — E785 Hyperlipidemia, unspecified: Secondary | ICD-10-CM | POA: Diagnosis not present

## 2016-09-29 DIAGNOSIS — Z114 Encounter for screening for human immunodeficiency virus [HIV]: Secondary | ICD-10-CM | POA: Diagnosis not present

## 2016-09-29 DIAGNOSIS — Z1159 Encounter for screening for other viral diseases: Secondary | ICD-10-CM

## 2016-09-29 DIAGNOSIS — Z0000 Encounter for general adult medical examination without abnormal findings: Secondary | ICD-10-CM | POA: Diagnosis not present

## 2016-09-29 DIAGNOSIS — R7303 Prediabetes: Secondary | ICD-10-CM

## 2016-09-29 DIAGNOSIS — Z125 Encounter for screening for malignant neoplasm of prostate: Secondary | ICD-10-CM | POA: Diagnosis not present

## 2016-09-29 LAB — COMPREHENSIVE METABOLIC PANEL
ALBUMIN: 4 g/dL (ref 3.5–5.2)
ALK PHOS: 56 U/L (ref 39–117)
ALT: 22 U/L (ref 0–53)
AST: 20 U/L (ref 0–37)
BILIRUBIN TOTAL: 0.5 mg/dL (ref 0.2–1.2)
BUN: 12 mg/dL (ref 6–23)
CALCIUM: 8.9 mg/dL (ref 8.4–10.5)
CO2: 28 mEq/L (ref 19–32)
Chloride: 103 mEq/L (ref 96–112)
Creatinine, Ser: 1.07 mg/dL (ref 0.40–1.50)
GFR: 76.44 mL/min (ref >60.00)
Glucose, Bld: 88 mg/dL (ref 70–99)
POTASSIUM: 4.4 meq/L (ref 3.5–5.1)
Sodium: 138 mEq/L (ref 135–145)
TOTAL PROTEIN: 6.4 g/dL (ref 6.0–8.3)

## 2016-09-29 LAB — CBC WITH DIFFERENTIAL/PLATELET
BASOS PCT: 0.4 % (ref 0.0–3.0)
Basophils Absolute: 0 10*3/uL (ref 0.0–0.1)
EOS ABS: 0.3 10*3/uL (ref 0.0–0.7)
EOS PCT: 4 % (ref 0.0–5.0)
HEMATOCRIT: 39.5 % (ref 39.0–52.0)
Hemoglobin: 13.6 g/dL (ref 13.0–17.0)
LYMPHS PCT: 27.9 % (ref 12.0–46.0)
Lymphs Abs: 1.9 10*3/uL (ref 0.7–4.0)
MCHC: 34.5 g/dL (ref 30.0–36.0)
MCV: 85.8 fl (ref 78.0–100.0)
Monocytes Absolute: 0.4 10*3/uL (ref 0.1–1.0)
Monocytes Relative: 5.4 % (ref 3.0–12.0)
NEUTROS ABS: 4.3 10*3/uL (ref 1.4–7.7)
Neutrophils Relative %: 62.3 % (ref 43.0–77.0)
PLATELETS: 184 10*3/uL (ref 150.0–400.0)
RBC: 4.61 Mil/uL (ref 4.22–5.81)
RDW: 13.9 % (ref 11.5–15.5)
WBC: 6.9 10*3/uL (ref 4.0–10.5)

## 2016-09-29 LAB — LIPID PANEL
CHOLESTEROL: 247 mg/dL — AB (ref 0–200)
HDL: 41 mg/dL (ref >39.00)
NONHDL: 205.8
TRIGLYCERIDES: 263 mg/dL — AB (ref 0.0–149.0)
Total CHOL/HDL Ratio: 6
VLDL: 52.6 mg/dL — ABNORMAL HIGH (ref 0.0–40.0)

## 2016-09-29 LAB — PSA, MEDICARE: PSA: 0.41 ng/mL (ref 0.10–4.00)

## 2016-09-29 LAB — HEMOGLOBIN A1C: HEMOGLOBIN A1C: 5.8 % (ref 4.6–6.5)

## 2016-09-29 LAB — LDL CHOLESTEROL, DIRECT: Direct LDL: 158 mg/dL

## 2016-09-29 NOTE — Telephone Encounter (Signed)
Per Barber, pt has not had a fecal occult test kit since 10/2015.

## 2016-09-29 NOTE — Progress Notes (Signed)
PCP notes:   Health maintenance:  Flu vaccine - administered Colon cancer screening - pt will discuss with CPE at next appt Hep C screening - completed HIV screening - completed  Abnormal screenings:   None  Patient concerns:   Pt reports feeling extremely fatigued.   Nurse concerns:  None  Next PCP appt:   10/13/16 @ 0900

## 2016-09-29 NOTE — Patient Instructions (Signed)
[name] , Thank you for taking time to come for your Medicare Wellness Visit. I appreciate your ongoing commitment to your health goals. Please review the following plan we discussed and let me know if I can assist you in the future.   These are the goals we discussed: Goals    . Increase physical activity          Starting 09/29/2016, I will continue to walk at least 30 min 3 days per week.        This is a list of the screening recommended for you and due dates:  Health Maintenance  Topic Date Due  . Cologuard (Stool DNA test)  09/29/2017*  . Stool Blood Test  10/28/2016  . DTaP/Tdap/Td vaccine (2 - Td) 11/28/2022  . Tetanus Vaccine  11/28/2022  . Flu Shot  Completed  .  Hepatitis C: One time screening is recommended by Center for Disease Control  (CDC) for  adults born from [birth year] through 1964.   Completed  . HIV Screening  Completed  *Topic was postponed. The date shown is not the original due date.   Preventive Care for Adults  A healthy lifestyle and preventive care can promote health and wellness. Preventive health guidelines for adults include the following key practices.  . A routine yearly physical is a good way to check with your health care provider about your health and preventive screening. It is a chance to share any concerns and updates on your health and to receive a thorough exam.  . Visit your dentist for a routine exam and preventive care every 6 months. Brush your teeth twice a day and floss once a day. Good oral hygiene prevents tooth decay and gum disease.  . The frequency of eye exams is based on your age, health, family medical history, use  of contact lenses, and other factors. Follow your health care provider's ecommendations for frequency of eye exams.  . Eat a healthy diet. Foods like vegetables, fruits, whole grains, low-fat dairy products, and lean protein foods contain the nutrients you need without too many calories. Decrease your intake of foods  high in solid fats, added sugars, and salt. Eat the right amount of calories for you. Get information about a proper diet from your health care provider, if necessary.  . Regular physical exercise is one of the most important things you can do for your health. Most adults should get at least 150 minutes of moderate-intensity exercise (any activity that increases your heart rate and causes you to sweat) each week. In addition, most adults need muscle-strengthening exercises on 2 or more days a week.  Silver Sneakers may be a benefit available to you. To determine eligibility, you may visit the website: www.silversneakers.com or contact program at [phone] Mon-Fri between 8AM-8PM.   . Maintain a healthy weight. The body mass index (BMI) is a screening tool to identify possible weight problems. It provides an estimate of body fat based on height and weight. Your health care provider can find your BMI and can help you achieve or maintain a healthy weight.   For adults 20 years and older: ? A BMI below 18.5 is considered underweight. ? A BMI of 18.5 to 24.9 is normal. ? A BMI of 25 to 29.9 is considered overweight. ? A BMI of 30 and above is considered obese.   . Maintain normal blood lipids and cholesterol levels by exercising and minimizing your intake of saturated fat. Eat a balanced diet with plenty  of fruit and vegetables. Blood tests for lipids and cholesterol should begin at age 54 and be repeated every 5 years. If your lipid or cholesterol levels are high, you are over 50, or you are at high risk for heart disease, you may need your cholesterol levels checked more frequently. Ongoing high lipid and cholesterol levels should be treated with medicines if diet and exercise are not working.  . If you smoke, find out from your health care provider how to quit. If you do not use tobacco, please do not start.  . If you choose to drink alcohol, please do not consume more than 2 drinks per day.  One drink is considered to be 12 ounces (355 mL) of beer, 5 ounces (148 mL) of wine, or 1.5 ounces (44 mL) of liquor.  . If you are 6655-54 years old, ask your health care provider if you should take aspirin to prevent strokes.  . Use sunscreen. Apply sunscreen liberally and repeatedly throughout the day. You should seek shade when your shadow is shorter than you. Protect yourself by wearing long sleeves, pants, a wide-brimmed hat, and sunglasses year round, whenever you are outdoors.  . Once a month, do a whole body skin exam, using a mirror to look at the skin on your back. Tell your health care provider of new moles, moles that have irregular borders, moles that are larger than a pencil eraser, or moles that have changed in shape or color.

## 2016-09-29 NOTE — Progress Notes (Signed)
Subjective:   Calvin Francis is a 55 y.o. male who presents for Medicare Annual/Subsequent preventive examination.  Review of Systems: N/A Cardiac Risk Factors include: obesity (BMI >30kg/m2);male gender;hypertension;dyslipidemia;smoking/ tobacco exposure     Objective:    Vitals: BP 122/80 (BP Location: Left Arm, Patient Position: Sitting, Cuff Size: Normal)   Pulse 83   Temp 98.4 F (36.9 C) (Oral)   Ht 6' 1.25" (1.861 m) Comment: no shoes  Wt 236 lb (107 kg)   SpO2 97%   BMI 30.92 kg/m   Body mass index is 30.92 kg/m.  Tobacco History  Smoking Status  . Heavy Tobacco Smoker  . Packs/day: 0.50  . Years: 20.00  . Types: Cigarettes  . Last attempt to quit: 04/28/2012  Smokeless Tobacco  . Never Used    Comment: long time smoker, smokes occasional -anxiety     Ready to quit: No Counseling given: No   Past Medical History:  Diagnosis Date  . 3-vessel CAD 02/22/2004   mid RCA 100% occluded, L-R collaterals; LAD 60-70% bifurcation lesion; Cx proximal 70% and mid 80% after OM1, OM1 100% occluded. --> CABG x 4  . Abnormal nuclear stress test August 2005   Occluded SVG-RCA  . Atrial fibrillation ([place])   . Barrett's esophagus    s/p EGD several years ago with [place]  . Bronchitis, mucopurulent recurrent ([place])   . CAD (coronary artery disease) of bypass graft May 2012; December 2013   Cath for angina and abnormal stress test: SVG-OM1 now occluded, attempt at PCI to the native circumflex OM1 unsuccessful; distal LAD beyond patent LIMA 70-80% (not PCI amenable) free radial-OM 2 patent; EF 45-50%  . Erectile dysfunction   . Former heavy cigarette smoker (20-39 per day) May 2013   . GERD (gastroesophageal reflux disease)    ?barrett's, with esophageal dysmotility, on omeprazole  . Glucose intolerance (impaired glucose tolerance)  2009   Prediabetes  . Gout   . History of Non-STEMI (non-ST elevated myocardial infarction) 02/22/2004   Three-vessel disease --> referred for  CABG  . History of Non-STEMI (non-ST elevated myocardial infarction) December 2011   Hazy lesion in SVG-OM1 --> staged PCI with 4.0 mm x 20 mm vision BMS (4.5 mm)  . HLD (hyperlipidemia)    Statin intolerant  . Hypertension, benign   . Ischemic cardiomyopathy Echo 10/2012   EF ~40%; moderate Posterior HypoKinesis, minld-moderate inferior Hypokinesis; mild RV dilation, mild concentric LVH  . 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 S/P most recent cardiac catheterization December 2013   severe; now nonrevascularizable, h/o MI x3 s/p CABG and PCI (native and bypass CAD), SVG-RCA & SVG-OM1 100%; patent LIMA -LAD (severe ~70-80% distal LAD disease after LIMA) & Free Radial OM2   . Stable angina ([place]) 11/2012   Chronic,some what stable but still present; cardiac cath results above, no significant change from 2012  . Testosterone deficiency    On replacement; goal is low normal.  . Varicose veins December 2013   with venous reflux status VNUS ablation of left greater saphenous wein   Past Surgical History:  Procedure Laterality Date  . CARDIAC CATHETERIZATION  May 01 2011   knowwn occlusion of SVG to RCA  as well as native RCA ; PCI to the SVG to OM1 ;patent LIMA to LAD with diffuse distal  LAD 70-80%,small  vessel dx not thought to be amenable to PCI .RCA 100% occluded ,OM2 patent,Circ diseased  aftr OM1. EF 45-50%  . CARDIAC CATHETERIZATION  12/27/2012  . CORONARY ANGIOPLASTY WITH STENT PLACEMENT  12/25/2010   s/p autologous vessel blockage  . CORONARY ARTERY BYPASS GRAFT  02/27/04   LIMA -LAD, Free Radial-OM2 -- patent; SVG-OM1, SVG-RCA, - 100% occluded by recent cath  . ESOPHAGOGASTRODUODENOSCOPY    . LEFT HEART CATHETERIZATION WITH CORONARY/GRAFT ANGIOGRAM N/A 12/27/2012   Procedure: LEFT HEART CATHETERIZATION WITH Beatrix Fetters;  Surgeon: Leonie Man, MD;  Location: [facility] CATH LAB;  Service: Cardiovascular;  Laterality: N/A;  . lower venous  extremity doppler Left 08/15/2011   normal ,s/p vein ablation  . MET/CPET  11/02/2012   suboptimal effort but peak VO2  was 52% which relatively significant. Myoview was suggestive for ant. ischemia may go along with his distal LAD   . NM MYOCAR PERF WALL MOTION  12/15/2012   high risk study  . US ECHOCARDIOGRAPHY  09/2012   mild LVH, EF 40%, mod posterior and inf wall hypokinesis,mild concentric LVH;; RV dilated, normal  fx.trace MR  . VEIN SURGERY  2011   solomon - h/o vericose veins   Family History  Problem Relation Age of Onset  . Diabetes Paternal Aunt   . Coronary artery disease Paternal Uncle   . Stroke Maternal Uncle   . Cancer Neg Hx    History  Sexual Activity  . Sexual activity: Yes    Outpatient Encounter Prescriptions as of 09/29/2016  Medication Sig  . albuterol (PROVENTIL HFA;VENTOLIN HFA) 108 (90 BASE) MCG/ACT inhaler Inhale 2 puffs into the lungs every 4 (four) hours as needed for wheezing or shortness of breath.  . ALPRAZolam (XANAX) 0.25 MG tablet Take 0.25 mg by mouth as needed for anxiety.   . APPLE CIDER VINEGAR PO Take by mouth.  . colchicine (COLCRYS) 0.6 MG tablet TAKE AS DIRECTED 2 TABLETS BY MOUTH ON FIRST DAY THEN ONE DAILY AS NEEDED **Schedule physical for further refills**  . fluorouracil (EFUDEX) 5 % cream Apply topically 2 (two) times daily.  [name] Oil Omega-3 300 MG CAPS Take 300 mg by mouth daily.  . lansoprazole (PREVACID SOLUTAB) 15 MG disintegrating tablet Take 15 mg by mouth daily at 12 noon.  Marland Kitchen lisinopril (PRINIVIL,ZESTRIL) 2.5 MG tablet Take 1 tablet (2.5 mg total) by mouth daily.  . nitroGLYCERIN (NITROSTAT) 0.4 MG SL tablet Place 1 tablet (0.4 mg total) under the tongue every 5 (five) minutes as needed for chest pain.  Marland Kitchen omeprazole (PRILOSEC OTC) 20 MG tablet Take 20 mg by mouth daily.  . prasugrel (EFFIENT) 10 MG TABS tablet Take 1 tablet (10 mg total) by mouth daily.  . ranolazine (RANEXA) 1000 MG SR tablet Take 1 tablet (1,000 mg  total) by mouth twice daily, as directed.  . triamcinolone cream (KENALOG) 0.1 % Apply 1 application topically 2 (two) times daily as needed.  . TURMERIC PO Take 2 capsules by mouth daily.   No facility-administered encounter medications on file as of 09/29/2016.     Activities of Daily Living In your present state of health, do you have any difficulty performing the following activities: 09/29/2016  Hearing? N  Vision? N  Difficulty concentrating or making decisions? N  Walking or climbing stairs? N  Dressing or bathing? N  Doing errands, shopping? N  Preparing Food and eating ? N  Using the Toilet? N  In the past six months, have you accidently leaked urine? N  Do you have problems with loss of bowel control? N  Managing your Medications?  N  Managing your Finances? N  Housekeeping or managing your Housekeeping? N  Some recent data might be hidden    Patient Care Team: Ria Bush, MD as PCP - General (Family Medicine) Leonie Man, MD as Consulting Physician (Cardiology)   Assessment:     Hearing Screening   125Hz 250Hz 500Hz 1000Hz 2000Hz 3000Hz 4000Hz 6000Hz 8000Hz  Right ear:   86 57 84  69    Left ear:   40 40 40  40      Visual Acuity Screening   Right eye Left eye Both eyes  Without correction: 20/30-1 20/20-1 20/25  With correction:       Exercise Activities and Dietary recommendations Current Exercise Habits: Home exercise routine, Type of exercise: walking, Time (Minutes): 30, Frequency (Times/Week): 3, Weekly Exercise (Minutes/Week): 90, Exercise limited by: None identified  Goals    . Increase physical activity          Starting 09/29/2016, I will continue to walk at least 30 min 3 days per week.       Fall Risk Fall Risk  09/29/2016 11/21/2014 09/06/2013  Falls in the past year? No No No   Depression Screen PHQ 2/9 Scores 09/29/2016 11/21/2014 09/06/2013  PHQ - 2 Score 0 2 2  PHQ- 9 Score - 10 -    Cognitive Testing MMSE - Mini Mental State  Exam 09/29/2016  Orientation to time 5  Orientation to Place 5  Registration 3  Attention/ Calculation 0  Recall 3  Language- name 2 objects 0  Language- repeat 1  Language- follow 3 step command 3  Language- read & follow direction 0  Write a sentence 0  Copy design 0  Total score 20   PLEASE NOTE: A Mini-Cog screen was completed. Maximum score is 20. A value of 0 denotes this part of [place] MMSE was not completed or the patient failed this part of the Mini-Cog screening.   Mini-Cog Screening Orientation to Time - Max 5 pts Orientation to Place - Max 5 pts Registration - Max 3 pts Recall - Max 3 pts Language Repeat - Max 1 pts Language Follow 3 Step Command - Max 3 pts  Immunization History  Administered Date(s) Administered  . Influenza,inj,Quad PF,36+ Mos 09/06/2013, 10/17/2014, 09/26/2015  . Tdap 11/28/2012   Screening Tests Health Maintenance  Topic Date Due  . Fecal DNA (Cologuard)  09/29/2017 (Originally 05/20/2012)  . COLON CANCER SCREENING ANNUAL FOBT  10/28/2016  . DTaP/Tdap/Td (2 - Td) 11/28/2022  . TETANUS/TDAP  11/28/2022  . INFLUENZA VACCINE  Completed  . Hepatitis C Screening  Completed  . HIV Screening  Completed      Plan:     I have personally reviewed and addressed the Medicare Annual Wellness questionnaire and have noted the following in the patient's chart:  A. Medical and social history B. Use of alcohol, tobacco or illicit drugs  C. Current medications and supplements D. Functional ability and status E.  Nutritional status F.  Physical activity G. Advance directives H. List of other physicians I.  Hospitalizations, surgeries, and ER visits in previous 12 months J.  [place] to include hearing, vision, cognitive, depression L. Referrals and appointments - none  In addition, I have reviewed and discussed with patient certain preventive protocols, quality metrics, and best practice recommendations. A written personalized care  plan for preventive services as well as general preventive health recommendations were provided to patient.  See attached scanned questionnaire for additional information.  Signed,   Lindell Noe, MHA, BS, LPN Health Advisor

## 2016-09-29 NOTE — Progress Notes (Signed)
Pre visit review using our clinic review tool, if applicable. No additional management support is needed unless otherwise documented below in the visit note. 

## 2016-09-29 NOTE — Telephone Encounter (Signed)
Contacted pt and advised him that [facility] did not have documentation to support a FOBT being completed in 2017. Encouraged pt to discuss an appropriate colon cancer screening test with PCP at next appt. Pt verbally agreed.

## 2016-09-30 LAB — HIV ANTIBODY (ROUTINE TESTING W REFLEX): HIV 1&2 Ab, 4th Generation: NONREACTIVE

## 2016-09-30 LAB — HEPATITIS C ANTIBODY: HCV AB: NEGATIVE

## 2016-09-30 NOTE — Progress Notes (Signed)
I reviewed health advisor's note, was available for consultation, and agree with documentation and plan.  

## 2016-10-08 ENCOUNTER — Other Ambulatory Visit: Payer: Self-pay | Admitting: Cardiology

## 2016-10-08 ENCOUNTER — Other Ambulatory Visit: Payer: Self-pay | Admitting: *Deleted

## 2016-10-08 MED ORDER — RANOLAZINE ER 1000 MG PO TB12: ORAL_TABLET | ORAL | 1 refills | Status: DC

## 2016-10-08 NOTE — Telephone Encounter (Signed)
NO SAMPLES AVAILABLE. PATIENT AWARE

## 2016-10-08 NOTE — Telephone Encounter (Signed)
Patient calling the office for samples of medication:   1.  What medication and dosage are you requesting samples for? Ranexa   2.  Are you currently out of this medication? Not Completely (8 days left )

## 2016-10-09 ENCOUNTER — Telehealth: Payer: Self-pay | Admitting: Cardiology

## 2016-10-09 MED ORDER — RANOLAZINE ER 1000 MG PO TB12: ORAL_TABLET | ORAL | 3 refills | Status: DC

## 2016-10-09 NOTE — Telephone Encounter (Signed)
New Message  Pt call to f/u on prescription for Ranexa 1000 mg. Please call back to discuss

## 2016-10-13 ENCOUNTER — Encounter: Payer: Self-pay | Admitting: Family Medicine

## 2016-10-13 ENCOUNTER — Ambulatory Visit (INDEPENDENT_AMBULATORY_CARE_PROVIDER_SITE_OTHER): Payer: Commercial Managed Care - HMO | Admitting: Family Medicine

## 2016-10-13 VITALS — BP 118/82 | HR 78 | Temp 97.7°F | Ht 73.0 in | Wt 236.8 lb

## 2016-10-13 DIAGNOSIS — I10 Essential (primary) hypertension: Secondary | ICD-10-CM | POA: Diagnosis not present

## 2016-10-13 DIAGNOSIS — E785 Hyperlipidemia, unspecified: Secondary | ICD-10-CM

## 2016-10-13 DIAGNOSIS — R7303 Prediabetes: Secondary | ICD-10-CM

## 2016-10-13 DIAGNOSIS — Z789 Other specified health status: Secondary | ICD-10-CM

## 2016-10-13 DIAGNOSIS — Z7189 Other specified counseling: Secondary | ICD-10-CM | POA: Diagnosis not present

## 2016-10-13 DIAGNOSIS — I25119 Atherosclerotic heart disease of native coronary artery with unspecified angina pectoris: Secondary | ICD-10-CM

## 2016-10-13 DIAGNOSIS — Z0000 Encounter for general adult medical examination without abnormal findings: Secondary | ICD-10-CM | POA: Diagnosis not present

## 2016-10-13 DIAGNOSIS — Z1211 Encounter for screening for malignant neoplasm of colon: Secondary | ICD-10-CM

## 2016-10-13 DIAGNOSIS — K22719 Barrett's esophagus with dysplasia, unspecified: Secondary | ICD-10-CM

## 2016-10-13 DIAGNOSIS — I208 Other forms of angina pectoris: Secondary | ICD-10-CM

## 2016-10-13 DIAGNOSIS — F17200 Nicotine dependence, unspecified, uncomplicated: Secondary | ICD-10-CM

## 2016-10-13 DIAGNOSIS — I255 Ischemic cardiomyopathy: Secondary | ICD-10-CM

## 2016-10-13 MED ORDER — PRAVASTATIN SODIUM 40 MG PO TABS: 40.0000 mg | ORAL_TABLET | Freq: Every day | ORAL | 6 refills | Status: DC

## 2016-10-13 MED ORDER — NITROGLYCERIN 0.4 MG SL SUBL: 0.4000 mg | SUBLINGUAL_TABLET | SUBLINGUAL | 0 refills | Status: DC | PRN

## 2016-10-13 MED ORDER — ALPRAZOLAM 0.25 MG PO TABS: 0.2500 mg | ORAL_TABLET | ORAL | 0 refills | Status: DC | PRN

## 2016-10-13 NOTE — Assessment & Plan Note (Signed)
Advanced directives: would want wife as HCPOA. Advanced directive packet provided last week.

## 2016-10-13 NOTE — Patient Instructions (Addendum)
Pass by lab to pick up stool kit.  Try pravastatin cholesterol medicine. If not tolerated well, return to see lipid clinic. I've refilled the alprazolam for you. Good to see you today. Return as needed or in 1 year for next physical.  Health Maintenance, Male A healthy lifestyle and preventative care can promote health and wellness.  Maintain regular health, dental, and eye exams.  Eat a healthy diet. Foods like vegetables, fruits, whole grains, low-fat dairy products, and lean protein foods contain the nutrients you need and are low in calories. Decrease your intake of foods high in solid fats, added sugars, and salt. Get information about a proper diet from your health care provider, if necessary.  Regular physical exercise is one of the most important things you can do for your health. Most adults should get at least 150 minutes of moderate-intensity exercise (any activity that increases your heart rate and causes you to sweat) each week. In addition, most adults need muscle-strengthening exercises on 2 or more days a week.   Maintain a healthy weight. The body mass index (BMI) is a screening tool to identify possible weight problems. It provides an estimate of body fat based on height and weight. Your health care provider can find your BMI and can help you achieve or maintain a healthy weight. For males 20 years and older:  A BMI below 18.5 is considered underweight.  A BMI of 18.5 to 24.9 is normal.  A BMI of 25 to 29.9 is considered overweight.  A BMI of 30 and above is considered obese.  Maintain normal blood lipids and cholesterol by exercising and minimizing your intake of saturated fat. Eat a balanced diet with plenty of fruits and vegetables. Blood tests for lipids and cholesterol should begin at age 62 and be repeated every 5 years. If your lipid or cholesterol levels are high, you are over age 73, or you are at high risk for heart disease, you may need your cholesterol levels  checked more frequently.Ongoing high lipid and cholesterol levels should be treated with medicines if diet and exercise are not working.  If you smoke, find out from your health care provider how to quit. If you do not use tobacco, do not start.  Lung cancer screening is recommended for adults aged 20-80 years who are at high risk for developing lung cancer because of a history of smoking. A yearly low-dose CT scan of the lungs is recommended for people who have at least a 30-pack-year history of smoking and are current smokers or have quit within the past 15 years. A pack year of smoking is smoking an average of 1 pack of cigarettes a day for 1 year (for example, a 30-pack-year history of smoking could mean smoking 1 pack a day for 30 years or 2 packs a day for 15 years). Yearly screening should continue until the smoker has stopped smoking for at least 15 years. Yearly screening should be stopped for people who develop a health problem that would prevent them from having lung cancer treatment.  If you choose to drink alcohol, do not have more than 2 drinks per day. One drink is considered to be 12 oz (360 mL) of beer, 5 oz (150 mL) of wine, or 1.5 oz (45 mL) of liquor.  Avoid the use of street drugs. Do not share needles with anyone. Ask for help if you need support or instructions about stopping the use of drugs.  High blood pressure causes heart disease and  increases the risk of stroke. High blood pressure is more likely to develop in:  People who have blood pressure in the end of the normal range (100-139/85-89 mm Hg).  People who are overweight or obese.  People who are African American.  If you are 56-55 years of age, have your blood pressure checked every 3-5 years. If you are 27 years of age or older, have your blood pressure checked every year. You should have your blood pressure measured twice--once when you are at a hospital or clinic, and once when you are not at a hospital or clinic.  Record the average of the two measurements. To check your blood pressure when you are not at a hospital or clinic, you can use:  An automated blood pressure machine at a pharmacy.  A home blood pressure monitor.  If you are 51-21 years old, ask your health care provider if you should take aspirin to prevent heart disease.  Diabetes screening involves taking a blood sample to check your fasting blood sugar level. This should be done once every 3 years after age 87 if you are at a normal weight and without risk factors for diabetes. Testing should be considered at a younger age or be carried out more frequently if you are overweight and have at least 1 risk factor for diabetes.  Colorectal cancer can be detected and often prevented. Most routine colorectal cancer screening begins at the age of 39 and continues through age 86. However, your health care provider may recommend screening at an earlier age if you have risk factors for colon cancer. On a yearly basis, your health care provider may provide home test kits to check for hidden blood in the stool. A small camera at the end of a tube may be used to directly examine the colon (sigmoidoscopy or colonoscopy) to detect the earliest forms of colorectal cancer. Talk to your health care provider about this at age 45 when routine screening begins. A direct exam of the colon should be repeated every 5-10 years through age 43, unless early forms of precancerous polyps or small growths are found.  People who are at an increased risk for hepatitis B should be screened for this virus. You are considered at high risk for hepatitis B if:  You were born in a country where hepatitis B occurs often. Talk with your health care provider about which countries are considered high risk.  Your parents were born in a high-risk country and you have not received a shot to protect against hepatitis B (hepatitis B vaccine).  You have HIV or AIDS.  You use needles to  inject street drugs.  You live with, or have sex with, someone who has hepatitis B.  You are a man who has sex with other men (MSM).  You get hemodialysis treatment.  You take certain medicines for conditions like cancer, organ transplantation, and autoimmune conditions.  Hepatitis C blood testing is recommended for all people born from [birth year] through 1964 and any individual with known risk factors for hepatitis C.  Healthy men should no longer receive prostate-specific antigen (PSA) blood tests as part of routine cancer screening. Talk to your health care provider about prostate cancer screening.  Testicular cancer screening is not recommended for adolescents or adult males who have no symptoms. Screening includes self-exam, a health care provider exam, and other screening tests. Consult with your health care provider about any symptoms you have or any concerns you have about testicular cancer.  Practice safe sex. Use condoms and avoid high-risk sexual practices to reduce the spread of sexually transmitted infections (STIs).  You should be screened for STIs, including gonorrhea and chlamydia if:  You are sexually active and are younger than 24 years.  You are older than 24 years, and your health care provider tells you that you are at risk for this type of infection.  Your sexual activity has changed since you were last screened, and you are at an increased risk for chlamydia or gonorrhea. Ask your health care provider if you are at risk.  If you are at risk of being infected with HIV, it is recommended that you take a prescription medicine daily to prevent HIV infection. This is called pre-exposure prophylaxis (PrEP). You are considered at risk if:  You are a man who has sex with other men (MSM).  You are a heterosexual man who is sexually active with multiple partners.  You take drugs by injection.  You are sexually active with a partner who has HIV.  Talk with your health care  provider about whether you are at high risk of being infected with HIV. If you choose to begin PrEP, you should first be tested for HIV. You should then be tested every 3 months for as long as you are taking PrEP.  Use sunscreen. Apply sunscreen liberally and repeatedly throughout the day. You should seek shade when your shadow is shorter than you. Protect yourself by wearing long sleeves, pants, a wide-brimmed hat, and sunglasses year round whenever you are outdoors.  Tell your health care provider of new moles or changes in moles, especially if there is a change in shape or color. Also, tell your health care provider if a mole is larger than the size of a pencil eraser.  A one-time screening for abdominal aortic aneurysm (AAA) and surgical repair of large AAAs by ultrasound is recommended for men aged 82-75 years who are current or former smokers.  Stay current with your vaccines (immunizations).   This information is not intended to replace advice given to you by your health care provider. Make sure you discuss any questions you have with your health care provider.   Document Released: 06/12/2008 Document Revised: 01/05/2015 Document Reviewed: 05/12/2011 Elsevier Interactive Patient Education [employer].

## 2016-10-13 NOTE — Progress Notes (Addendum)
BP 118/82   Pulse 78   Temp 97.7 F (36.5 C) (Oral)   Ht 6\' 1"  (1.854 m)   Wt 236 lb 12 oz (107.4 kg)   BMI 31.24 kg/m    CC: CPE Subjective:    Patient ID: Calvin Francis, male    DOB: 11/24/1961, 55 y.o.   MRN: [id number]  HPI: Calvin Francis is a 55 y.o. male presenting on 10/13/2016 for Annual Exam   Saw Virl Axe last week for medicare wellness visit, note reviewed. Ongoing extreme fatigue.   CAD s/p 4v CABG with ischemic CM - stable angina class I-II on ranexa. NSTEMI 2011 with PCI with BMS, several catheterizations since then. Poor lipid control due to statin and other medication intolerance including nitrates. On effient and ranexa. Off beta blockers due to fatigue. Angina tends to get worse in the winter months. Plan was to f/u with lipid clinic for further plan.   Requests alprazolam refilled today - takes 1/2 PRN anxiety, rarely. Previously prescribed by Dr Ferd Glassing.   Not currently taking colchicine.  Preventative: Colon cancer screening - discussed, requests iFOB. Did not turn in iFOB last few year.  Prostate cancer screening - normal 10/2014. PSA normal. DRE Q few years.  Flu shot yearly Tdap done - 11/2012 Advanced directives: would want wife as HCPOA. Advanced directive packet provided last week. Seat belt use discussed. Sunscreen use discussed. No changing moles on skin.  Continued smoker - 1/2 ppd. Did not tolerate chantix.  Alcohol - occasional  Caffeine: 1 cup coffee in am  Lives with wife, 2 dogs  Grown children, 3 grandchildren  Occupation: disability from cardiac status, prior worked for [employer]  Activity: walks driveway, limited by chest pain/SOB  Diet: some water, fruits/vegetables occasionally   Relevant past medical, surgical, family and social history reviewed and updated as indicated. Interim medical history since our last visit reviewed. Allergies and medications reviewed and updated. Current Outpatient Prescriptions on File Prior  to Visit  Medication Sig  . albuterol (PROVENTIL HFA;VENTOLIN HFA) 108 (90 BASE) MCG/ACT inhaler Inhale 2 puffs into the lungs every 4 (four) hours as needed for wheezing or shortness of breath.  . APPLE CIDER VINEGAR PO Take by mouth.  . colchicine (COLCRYS) 0.6 MG tablet TAKE AS DIRECTED 2 TABLETS BY MOUTH ON FIRST DAY THEN ONE DAILY AS NEEDED **Schedule physical for further refills**  . fluorouracil (EFUDEX) 5 % cream Apply topically 2 (two) times daily.  Boris Lown Oil Omega-3 300 MG CAPS Take 300 mg by mouth daily.  . lansoprazole (PREVACID SOLUTAB) 15 MG disintegrating tablet Take 15 mg by mouth daily at 12 noon.  Marland Kitchen lisinopril (PRINIVIL,ZESTRIL) 2.5 MG tablet Take 1 tablet (2.5 mg total) by mouth daily.  Marland Kitchen omeprazole (PRILOSEC OTC) 20 MG tablet Take 20 mg by mouth daily.  . prasugrel (EFFIENT) 10 MG TABS tablet Take 1 tablet (10 mg total) by mouth daily.  . ranolazine (RANEXA) 1000 MG SR tablet Take 1 tablet (1,000 mg total) by mouth twice daily, as directed.  . triamcinolone cream (KENALOG) 0.1 % Apply 1 application topically 2 (two) times daily as needed.  . TURMERIC PO Take 2 capsules by mouth daily.   No current facility-administered medications on file prior to visit.     Review of Systems  Constitutional: Negative for activity change, appetite change, chills, fatigue, fever and unexpected weight change.  HENT: Negative for hearing loss.   Eyes: Negative for visual disturbance.  Respiratory: Positive for chest tightness.  Negative for cough, shortness of breath and wheezing.   Cardiovascular: Positive for chest pain. Negative for palpitations and leg swelling.  Gastrointestinal: Negative for abdominal distention, abdominal pain, blood in stool, constipation, diarrhea, nausea and vomiting.  Genitourinary: Negative for difficulty urinating and hematuria.  Musculoskeletal: Positive for arthralgias and myalgias. Negative for neck pain.  Skin: Negative for rash.  Neurological: Positive  for dizziness. Negative for seizures, syncope and headaches.  Hematological: Negative for adenopathy. Does not bruise/bleed easily.  Psychiatric/Behavioral: Negative for dysphoric mood. The patient is not nervous/anxious.    Per HPI unless specifically indicated in ROS section     Objective:    BP 118/82   Pulse 78   Temp 97.7 F (36.5 C) (Oral)   Ht 6\' 1"  (1.854 m)   Wt 236 lb 12 oz (107.4 kg)   BMI 31.24 kg/m   Wt Readings from Last 3 Encounters:  10/13/16 236 lb 12 oz (107.4 kg)  09/29/16 236 lb (107 kg)  09/11/16 235 lb 9.6 oz (106.9 kg)    Physical Exam  Constitutional: He is oriented to person, place, and time. He appears well-developed and well-nourished. No distress.  HENT:  Head: Normocephalic and atraumatic.  Right Ear: Hearing, tympanic membrane, external ear and ear canal normal.  Left Ear: Hearing, tympanic membrane, external ear and ear canal normal.  Nose: Nose normal.  Mouth/Throat: Uvula is midline, oropharynx is clear and moist and mucous membranes are normal. No oropharyngeal exudate, posterior oropharyngeal edema or posterior oropharyngeal erythema.  Eyes: Conjunctivae and EOM are normal. Pupils are equal, round, and reactive to light. No scleral icterus.  Neck: Normal range of motion. Neck supple. Carotid bruit is not present. No thyromegaly present.  Cardiovascular: Normal rate, regular rhythm, normal heart sounds and intact distal pulses.   No murmur heard. Pulses:      Radial pulses are 2+ on the right side, and 2+ on the left side.  Pulmonary/Chest: Effort normal and breath sounds normal. No respiratory distress. He has no wheezes. He has no rales.  Abdominal: Soft. Bowel sounds are normal. He exhibits no distension and no mass. There is no tenderness. There is no rebound and no guarding.  Musculoskeletal: Normal range of motion. He exhibits no edema.  Lymphadenopathy:    He has no cervical adenopathy.  Neurological: He is alert and oriented to  person, place, and time.  CN grossly intact, station and gait intact  Skin: Skin is warm and dry. No rash noted.  Psychiatric: He has a normal mood and affect. His behavior is normal. Judgment and thought content normal.  Nursing note and vitals reviewed.  Results for orders placed or performed in visit on 09/29/16  Lipid panel  Result Value Ref Range   Cholesterol 247 (H) 0 - 200 mg/dL   Triglycerides 161.0 (H) 0.0 - 149.0 mg/dL   HDL 96.04 >54.09 mg/dL   VLDL 81.1 (H) 0.0 - 91.4 mg/dL   Total CHOL/HDL Ratio 6    NonHDL 205.80   Comprehensive metabolic panel  Result Value Ref Range   Sodium 138 135 - 145 mEq/L   Potassium 4.4 3.5 - 5.1 mEq/L   Chloride 103 96 - 112 mEq/L   CO2 28 19 - 32 mEq/L   Glucose, Bld 88 70 - 99 mg/dL   BUN 12 6 - 23 mg/dL   Creatinine, Ser 7.82 0.40 - 1.50 mg/dL   Total Bilirubin 0.5 0.2 - 1.2 mg/dL   Alkaline Phosphatase 56 39 - 117 U/L  AST 20 0 - 37 U/L   ALT 22 0 - 53 U/L   Total Protein 6.4 6.0 - 8.3 g/dL   Albumin 4.0 3.5 - 5.2 g/dL   Calcium 8.9 8.4 - 11.9 mg/dL   GFR 14.78 >29.56 mL/min  Hemoglobin A1c  Result Value Ref Range   Hgb A1c MFr Bld 5.8 4.6 - 6.5 %  Hepatitis C antibody  Result Value Ref Range   HCV Ab NEGATIVE NEGATIVE  HIV antibody  Result Value Ref Range   HIV 1&2 Ab, 4th Generation NONREACTIVE NONREACTIVE  PSA, Medicare  Result Value Ref Range   PSA 0.41 0.10 - 4.00 ng/ml  CBC with Differential/Platelet  Result Value Ref Range   WBC 6.9 4.0 - 10.5 K/uL   RBC 4.61 4.22 - 5.81 Mil/uL   Hemoglobin 13.6 13.0 - 17.0 g/dL   HCT 21.3 08.6 - 57.8 %   MCV 85.8 78.0 - 100.0 fl   MCHC 34.5 30.0 - 36.0 g/dL   RDW 46.9 62.9 - 52.8 %   Platelets 184.0 150.0 - 400.0 K/uL   Neutrophils Relative % 62.3 43.0 - 77.0 %   Lymphocytes Relative 27.9 12.0 - 46.0 %   Monocytes Relative 5.4 3.0 - 12.0 %   Eosinophils Relative 4.0 0.0 - 5.0 %   Basophils Relative 0.4 0.0 - 3.0 %   Neutro Abs 4.3 1.4 - 7.7 K/uL   Lymphs Abs 1.9 0.7 -  4.0 K/uL   Monocytes Absolute 0.4 0.1 - 1.0 K/uL   Eosinophils Absolute 0.3 0.0 - 0.7 K/uL   Basophils Absolute 0.0 0.0 - 0.1 K/uL  LDL cholesterol, direct  Result Value Ref Range   Direct LDL 158.0 mg/dL      Assessment & Plan:   Problem List Items Addressed This Visit    Advanced care planning/counseling discussion    Advanced directives: would want wife as HCPOA. Advanced directive packet provided last week.      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 Continue effient and ranexa      Relevant Medications   nitroGLYCERIN (NITROSTAT) 0.4 MG SL tablet   pravastatin (PRAVACHOL) 40 MG tablet   Barrett's esophagus    Will contact  GI to update EMR with prior EGD. Continue prevacid 15mg  daily.      Essential hypertension (Chronic)    Chronic, stable. Continue current regimen.      Relevant Medications   nitroGLYCERIN (NITROSTAT) 0.4 MG SL tablet   pravastatin (PRAVACHOL) 40 MG tablet   Health maintenance examination - Primary    Preventative protocols reviewed and updated unless pt declined. Discussed healthy diet and lifestyle.       Hyperlipidemia with target LDL less than 70 with statin related symptoms (Chronic)    Agrees to try pravastatin low potency statin. Did not tolerate high potency statin in the past. Suggested f/u with lipid clinic.       Relevant Medications   nitroGLYCERIN (NITROSTAT) 0.4 MG SL tablet   pravastatin (PRAVACHOL) 40 MG tablet   Ischemic cardiomyopathy (Chronic)   Relevant Medications   nitroGLYCERIN (NITROSTAT) 0.4 MG SL tablet   pravastatin (PRAVACHOL) 40 MG tablet   Prediabetes (Chronic)    Encouraged decreased added sugars.       Smoker    Continue to encourage cessation. 1/2 ppd.       Stable angina: Class I-II (Chronic)   Relevant Medications   nitroGLYCERIN (NITROSTAT) 0.4  MG SL tablet   pravastatin (PRAVACHOL) 40 MG tablet    Statin intolerance (Chronic)    Other Visit Diagnoses    Special screening for malignant neoplasms, colon       Relevant Orders   Fecal occult blood, imunochemical       Follow up plan: Return in about 1 year (around 10/13/2017) for annual exam, prior fasting for blood work.  Eustaquio BoydenJavier Louise Victory, MD

## 2016-10-13 NOTE — Assessment & Plan Note (Signed)
Continue to encourage cessation.  <1/2 ppd 

## 2016-10-13 NOTE — Progress Notes (Signed)
Pre visit review using our clinic review tool, if applicable. No additional management support is needed unless otherwise documented below in the visit note. 

## 2016-10-13 NOTE — Assessment & Plan Note (Signed)
Preventative protocols reviewed and updated unless pt declined. Discussed healthy diet and lifestyle.  

## 2016-10-13 NOTE — Assessment & Plan Note (Signed)
Encouraged decreased added sugars. 

## 2016-10-13 NOTE — Assessment & Plan Note (Addendum)
Will contact [facility] to update EMR with prior EGD. Continue prevacid 15mg  daily.

## 2016-10-13 NOTE — Assessment & Plan Note (Signed)
Chronic, stable. Continue current regimen. 

## 2016-10-13 NOTE — Assessment & Plan Note (Signed)
Agrees to try pravastatin low potency statin. Did not tolerate high potency statin in the past. Suggested f/u with lipid clinic.

## 2016-10-13 NOTE — Assessment & Plan Note (Signed)
Appreciate cards care of patient. Continue effient and ranexa

## 2016-10-22 ENCOUNTER — Telehealth: Payer: Self-pay | Admitting: Pharmacist Clinician (PhC)/ Clinical Pharmacy Specialist

## 2016-10-22 NOTE — Telephone Encounter (Signed)
Spoke with patient about Repatha.  He is on disability Medicare and, although it was approved, his copay was $570/month.  [facility] that [facility] not able to get him any help in paying for the medication.  Patient currently on pravastatin 40, just started in past week.  Advised that if muscle aches develop, he should stop x 2 weeks, then restart at only twice weekly.  He can increase by 1 tab/week after 3-4 weeks until taking qod or pain develops.  Patient voiced understanding.    Will contact him in January for repeat labs and re-application thru [employer] patient assistance program.

## 2016-12-18 ENCOUNTER — Other Ambulatory Visit: Payer: Self-pay | Admitting: Cardiology

## 2016-12-19 ENCOUNTER — Other Ambulatory Visit: Payer: Self-pay

## 2016-12-19 MED ORDER — PRAVASTATIN SODIUM 40 MG PO TABS: 40.0000 mg | ORAL_TABLET | Freq: Every day | ORAL | 3 refills | Status: DC

## 2016-12-19 NOTE — Telephone Encounter (Signed)
Rx sent electronically.  

## 2017-01-07 ENCOUNTER — Other Ambulatory Visit: Payer: Self-pay | Admitting: *Deleted

## 2017-01-21 ENCOUNTER — Other Ambulatory Visit: Payer: Self-pay | Admitting: *Deleted

## 2017-01-21 NOTE — Telephone Encounter (Signed)
Ok to refill? Last filled 10/13/16 #30 0RF. Will need written Rx for mail order. Thanks

## 2017-01-23 MED ORDER — ALPRAZOLAM 0.25 MG PO TABS
0.2500 mg | ORAL_TABLET | ORAL | 0 refills | Status: DC | PRN
Start: 2017-01-23 — End: 2017-02-07

## 2017-01-23 NOTE — Telephone Encounter (Signed)
Printed and in Kim's box 

## 2017-01-27 NOTE — Telephone Encounter (Signed)
Rx faxed to mail order 

## 2017-02-01 ENCOUNTER — Other Ambulatory Visit: Payer: Self-pay | Admitting: Family Medicine

## 2017-02-03 DIAGNOSIS — I208 Other forms of angina pectoris: Secondary | ICD-10-CM | POA: Diagnosis not present

## 2017-02-03 DIAGNOSIS — I25708 Atherosclerosis of coronary artery bypass graft(s), unspecified, with other forms of angina pectoris: Secondary | ICD-10-CM | POA: Diagnosis not present

## 2017-02-03 DIAGNOSIS — I83893 Varicose veins of bilateral lower extremities with other complications: Secondary | ICD-10-CM | POA: Diagnosis not present

## 2017-02-03 DIAGNOSIS — I25119 Atherosclerotic heart disease of native coronary artery with unspecified angina pectoris: Secondary | ICD-10-CM | POA: Diagnosis not present

## 2017-02-03 DIAGNOSIS — G4733 Obstructive sleep apnea (adult) (pediatric): Secondary | ICD-10-CM | POA: Diagnosis not present

## 2017-02-03 DIAGNOSIS — I255 Ischemic cardiomyopathy: Secondary | ICD-10-CM | POA: Diagnosis not present

## 2017-02-03 DIAGNOSIS — I2510 Atherosclerotic heart disease of native coronary artery without angina pectoris: Secondary | ICD-10-CM | POA: Diagnosis not present

## 2017-02-03 DIAGNOSIS — E785 Hyperlipidemia, unspecified: Secondary | ICD-10-CM | POA: Diagnosis not present

## 2017-02-03 DIAGNOSIS — I10 Essential (primary) hypertension: Secondary | ICD-10-CM | POA: Diagnosis not present

## 2017-02-04 LAB — COMPREHENSIVE METABOLIC PANEL
ALK PHOS: 55 U/L (ref 40–115)
ALT: 20 U/L (ref 9–46)
AST: 22 U/L (ref 10–35)
Albumin: 4.4 g/dL (ref 3.6–5.1)
BILIRUBIN TOTAL: 0.5 mg/dL (ref 0.2–1.2)
BUN: 8 mg/dL (ref 7–25)
CALCIUM: 9 mg/dL (ref 8.6–10.3)
CO2: 24 mmol/L (ref 20–31)
Chloride: 103 mmol/L (ref 98–110)
Creat: 1 mg/dL (ref 0.70–1.33)
GLUCOSE: 86 mg/dL (ref 65–99)
Potassium: 4.5 mmol/L (ref 3.5–5.3)
Sodium: 139 mmol/L (ref 135–146)
Total Protein: 6.4 g/dL (ref 6.1–8.1)

## 2017-02-04 LAB — LIPID PANEL
CHOLESTEROL: 189 mg/dL (ref <200)
HDL: 32 mg/dL — ABNORMAL LOW (ref >40)
LDL Cholesterol: 107 mg/dL — ABNORMAL HIGH (ref <100)
Total CHOL/HDL Ratio: 5.9 Ratio — ABNORMAL HIGH (ref <5.0)
Triglycerides: 251 mg/dL — ABNORMAL HIGH (ref <150)
VLDL: 50 mg/dL — AB (ref <30)

## 2017-02-06 NOTE — Telephone Encounter (Signed)
Jess with [facility] [place] [facility] left v/m requesting dosage frequency for xanax. Ref # [id number].

## 2017-02-07 MED ORDER — ALPRAZOLAM 0.25 MG PO TABS: 0.2500 mg | ORAL_TABLET | Freq: Every day | ORAL | 0 refills | Status: DC | PRN

## 2017-02-07 NOTE — Addendum Note (Signed)
Addended by: Eustaquio BoydenGUTIERREZ, Calissa Swenor on: 02/07/2017 10:52 AM   Modules accepted: Orders

## 2017-02-07 NOTE — Telephone Encounter (Signed)
plz clarify - QD PRN.

## 2017-02-09 ENCOUNTER — Telehealth: Payer: Self-pay | Admitting: Pharmacist Clinician (PhC)/ Clinical Pharmacy Specialist

## 2017-02-09 NOTE — Telephone Encounter (Signed)
Spoke with patient about cholesterol labs.  Has hx of non-STEMI with staged PCI of SVG-OM1 and CABG x 4.  He has been intolerant to most statins, but is currently able to tolerate pravastatin 40 mg every other day.  He does admit so some joint pain in his knee, but states that it has not been enough to withhold the medication.   We had discussed starting him on Repatha, however when it was approved by his insurance (Medicare) the copay was > $500 per month.  We discussed the [employer] foundation, however his wife's income in addition to his is greater than the $81,000 limit.    Most current labs show LDL of 107.  Suggested participation in ORION-10 study.  Explained purpose and basics of trial medication.  Patient willing to talk to Research about this possibility.  Will forward to Brunilda PayorJennifer Knapp RN.

## 2017-04-14 ENCOUNTER — Ambulatory Visit (INDEPENDENT_AMBULATORY_CARE_PROVIDER_SITE_OTHER): Payer: Medicare HMO | Admitting: Cardiology

## 2017-04-14 ENCOUNTER — Encounter: Payer: Self-pay | Admitting: Cardiology

## 2017-04-14 VITALS — BP 120/80 | HR 86 | Ht 73.0 in | Wt 243.0 lb

## 2017-04-14 DIAGNOSIS — I10 Essential (primary) hypertension: Secondary | ICD-10-CM

## 2017-04-14 DIAGNOSIS — I255 Ischemic cardiomyopathy: Secondary | ICD-10-CM | POA: Diagnosis not present

## 2017-04-14 DIAGNOSIS — E291 Testicular hypofunction: Secondary | ICD-10-CM

## 2017-04-14 DIAGNOSIS — I208 Other forms of angina pectoris: Secondary | ICD-10-CM | POA: Diagnosis not present

## 2017-04-14 DIAGNOSIS — I25119 Atherosclerotic heart disease of native coronary artery with unspecified angina pectoris: Secondary | ICD-10-CM | POA: Diagnosis not present

## 2017-04-14 DIAGNOSIS — E785 Hyperlipidemia, unspecified: Secondary | ICD-10-CM

## 2017-04-14 DIAGNOSIS — Z789 Other specified health status: Secondary | ICD-10-CM | POA: Diagnosis not present

## 2017-04-14 DIAGNOSIS — I2510 Atherosclerotic heart disease of native coronary artery without angina pectoris: Secondary | ICD-10-CM | POA: Diagnosis not present

## 2017-04-14 DIAGNOSIS — I83899 Varicose veins of unspecified lower extremities with other complications: Secondary | ICD-10-CM | POA: Diagnosis not present

## 2017-04-14 DIAGNOSIS — I25708 Atherosclerosis of coronary artery bypass graft(s), unspecified, with other forms of angina pectoris: Secondary | ICD-10-CM | POA: Diagnosis not present

## 2017-04-14 DIAGNOSIS — R4589 Other symptoms and signs involving emotional state: Secondary | ICD-10-CM

## 2017-04-14 DIAGNOSIS — I2089 Other forms of angina pectoris: Secondary | ICD-10-CM

## 2017-04-14 DIAGNOSIS — F329 Major depressive disorder, single episode, unspecified: Secondary | ICD-10-CM

## 2017-04-14 MED ORDER — LOSARTAN POTASSIUM 25 MG PO TABS: 25.0000 mg | ORAL_TABLET | Freq: Every day | ORAL | 3 refills | Status: DC

## 2017-04-14 NOTE — Assessment & Plan Note (Addendum)
Reduced EF by Myoview and echocardiogram back in 2013. I do think is some exertional dyspnea he has is related to reduce pump function as well as potentially anginal symptoms. He clearly also has diastolic dysfunction. However he does seem euvolemic today with exertion of his varicose veins. Had not previously been on diuretic.  Continue to recommend support stockings/compression hose and for now would avoid diuretic.

## 2017-04-14 NOTE — Assessment & Plan Note (Signed)
He finally says to be tolerating low-dose pravastatin and now is on at least moderate dose. Continue to monitor but he may need more aggressive evaluated by our lipid clinic.

## 2017-04-14 NOTE — Assessment & Plan Note (Signed)
He does seem chronically depressed and I think he probably would benefit from an SSRI. I will defer this to his PCP, but it seemed like this is not getting through. He needs more than just when necessary medications.

## 2017-04-14 NOTE — Assessment & Plan Note (Signed)
He seems to be pretty stable overall. Now I think we can continue with his current dose of Ranexa. If he is actually able to get coverage for Ranexa, we can probably increase his dosage. He did not do well with nitrates, however we can retry Imdur if necessary. He continues to be borderline hypotensive, and therefore is not on much the way of any aunt anginal blood pressure medications. If necessary we could switch from lisinopril to amlodipine. We had stopped his beta blockers due to fatigue.

## 2017-04-14 NOTE — Progress Notes (Signed)
PCP: Ria Bush, MD  Clinic Note: Chief Complaint  Patient presents with  . Follow-up    pt c/o DOE and chest pain while trying to do stuff  . Coronary Artery Disease    Chronic stable angina  . Cardiomyopathy  Problem List 1. Stable angina: Class I-II   2. 3-vessel CAD: Atherosclerosis of native coronary artery of native heart with angina pectoris ([place])  3. S/P CABG x 4   4. Coronary artery disease involving coronary bypass graft with other forms of angina pectoris ([place])   5. Ischemic cardiomyopathy   6. Hyperlipidemia with target LDL less than 70 with statin related symptoms   7. Statin intolerance   8. Essential hypertension   9. Varicose veins of leg with swelling, bilateral   10. Smoker    HPI: SHNEUR Calvin Francis is a 56 y.o. male with a PMH below who presents today for delayed six-month visits with chronic stable angina from chronic CAD. He is a former patient of Dr. Chase Picket, I first met him in December 2011 when he presented with a non-STEMI. He underwent PCI of SVG-OM1 - that area is now occluded. He has had several stress tests in catheterization since then. He now has chronic stable angina at least class II on Ranexa. Most Recent Cath  occluded SVG-OM1 prior to the stent, occluded SVG-RCA with occluded native RCA.   Patent LIMA-LAD with a distal LAD 70% lesion not amenable to PCI  Patent Radial Graft to OM2 --> An attempt was made to intervene on the native circumflex which was unsuccessful. His lipid control is very poor because of statin and other medication intolerance.  He was last seen on 09/11/2016:   Recent Hospitalizations: n/a  Studies Reviewed: n/a  Interval History: Celedonio returns today overall doing fairly well. He says "pretty much the same ". He has up days and down days. He is trying to walk more than he had been. Tries to walk with his wife, but really has a hard time keeping up. Simply can't push himself too hard without having anginal  chest discomfort and dyspnea. He is trying to stretch his Ranexa, and says that when he has bad days he will take an extra dose which occurs may be one or 2 days out of the week. Bad weeks will be 3. He denies any PND or orthopnea, and really does not have any edema except for his varicose veins. He really says that limits him a lot walking his lateral knee pain that has significant osteoarthritis..   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  Significant varicose veins to the thighs, painful and uncomfortable. Some cramping sensations. He has not wanted to go through procedures.  No palpitations, lightheadedness, dizziness, weakness or syncope/near syncope. No TIA/amaurosis fugax symptoms. No melena, hematochezia, hematuria, or epstaxis. No claudication.  ROS: A comprehensive was performed. Review of Systems  Constitutional: Positive for malaise/fatigue (He just denies having any significant energy. It does seem to be better.).  Respiratory: Positive for cough (Dry hacking) and shortness of breath.   Cardiovascular: Positive for orthopnea (No change from baseline).       Stable exertional angina  Genitourinary:       Still notes erectile dysfunction  Musculoskeletal: Positive for joint pain (Right greater than left knee).  Endo/Heme/Allergies: Positive for environmental allergies. Bruises/bleeds easily.  Psychiatric/Behavioral: Positive for depression.  Negative for memory loss (Better). The patient is not nervous/anxious.   All other systems reviewed and are negative.   Past Medical History:  Diagnosis Date  . 3-vessel CAD 02/22/2004   mid RCA 100% occluded, L-R collaterals; LAD 60-70% bifurcation lesion; Cx proximal 70% and mid 80% after OM1, OM1 100% occluded. --> CABG x 4  . Barrett's esophagus    s/p EGD several years ago with [facility]  . Bronchitis,  mucopurulent recurrent ([place])   . CAD (coronary artery disease) of bypass graft 5/'12; 12/'13   Cath for angina and Abn Myoview ST: SVG-OM1 now occluded, attempt at PCI to the native circumflex OM1 unsuccessful; distal LAD beyond patent LIMA 70-80% (not PCI amenable); free radial-OM 2 patent; EF 45-50%  . Erectile dysfunction   . Former heavy cigarette smoker (20-39 per day) May 2013   . GERD (gastroesophageal reflux disease)    ?barrett's, with esophageal dysmotility, on omeprazole  . Glucose intolerance (impaired glucose tolerance)  2009   Prediabetes  . Gout   . History of Non-STEMI (non-ST elevated myocardial infarction) 02/22/2004   Three-vessel disease --> referred for CABG  . History of Non-STEMI (non-ST elevated myocardial infarction) December 2011   Hazy lesion in SVG-OM1 --> staged PCI with 4.0 mm x 20 mm vision BMS (4.5 mm)  . HLD (hyperlipidemia)    Statin intolerant  . Hypertension, benign   . Ischemic cardiomyopathy Echo 10/2012   EF ~40%; moderate Posterior HypoKinesis, minld-moderate inferior Hypokinesis; mild RV dilation, mild concentric LVH  . 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 Stable angina ([place]) 11/2012   Chronic,some what stable but still present; cardiac cath results above, no significant change from 2012  . Testosterone deficiency    On replacement; goal is low normal.  . Varicose veins December 2013   with venous reflux status VNUS ablation of left greater saphenous wein    Past Surgical History:  Procedure Laterality Date  . CARDIAC CATHETERIZATION  May 01 2011   knowwn occlusion of SVG to RCA  as well as native RCA ; PCI to the SVG to OM1 ;patent LIMA to LAD with diffuse distal  LAD 70-80%,small  vessel dx not thought to be amenable to PCI .RCA 100% occluded ,OM2 patent,Circ diseased aftr OM1. EF 45-50%  . CORONARY ANGIOPLASTY WITH STENT PLACEMENT  12/25/2010   s/p autologous vessel blockage  . CORONARY ARTERY BYPASS  GRAFT  02/27/04   LIMA -LAD, Free Radial-OM2 -- patent; SVG-OM1, SVG-RCA, - 100% occluded by recent cath  . ESOPHAGOGASTRODUODENOSCOPY    . LEFT HEART CATHETERIZATION WITH CORONARY/GRAFT ANGIOGRAM N/A 12/27/2012   Procedure: LEFT HEART CATHETERIZATION WITH Beatrix Fetters;  Surgeon: Leonie Man, MD;  Location: [facility] CATH LAB: Known nRCA, mLAD, OM1 & OM2 100%, Known SVG-RCA 100%, SVG-OM1 100% ISR. Patent LIMA-dLAD w/ 60-80% dLAD. Patent frRAD-OM2. LCx-RPL collaterals.   EF ~35%  . lower venous extremity doppler Left 08/15/2011   normal ,s/p vein ablation  . MET/CPET  11/02/2012   suboptimal effort but peak VO2  was 52% which relatively significant. Myoview was suggestive for ant. ischemia may go along with his distal LAD   . NM MYOCAR PERF WALL MOTION  12/15/2012   high risk study  . TRANSTHORACIC ECHOCARDIOGRAM  10/2012   mild LVH, EF 40%, mod posterior and inf wall hypokinesis,mild concentric LVH;; RV dilated, normal  fx.trace MR  . VEIN SURGERY  2011   solomon - h/o  vericose veins    Current Meds  Medication Sig  . albuterol (PROVENTIL HFA;VENTOLIN HFA) 108 (90 BASE) MCG/ACT inhaler Inhale 2 puffs into the lungs every 4 (four) hours as needed for wheezing or shortness of breath.  . ALPRAZolam (XANAX) 0.25 MG tablet Take 1 tablet (0.25 mg total) by mouth daily as needed for anxiety.  . APPLE CIDER VINEGAR PO Take by mouth.  . colchicine (COLCRYS) 0.6 MG tablet TAKE AS DIRECTED 2 TABLETS BY MOUTH ON FIRST DAY THEN ONE DAILY AS NEEDED **Schedule physical for further refills**  . fluorouracil (EFUDEX) 5 % cream Apply topically 2 (two) times daily.  . nitroGLYCERIN (NITROSTAT) 0.4 MG SL tablet Place 1 tablet (0.4 mg total) under the tongue every 5 (five) minutes as needed for chest pain.  Marland Kitchen omeprazole (PRILOSEC OTC) 20 MG tablet Take 20 mg by mouth daily.  . prasugrel (EFFIENT) 10 MG TABS tablet Take 1 tablet (10 mg total) by mouth daily.  . pravastatin (PRAVACHOL) 40 MG tablet Take 1  tablet (40 mg total) by mouth daily. (Patient taking differently: Take 40 mg by mouth every other day. Pt states he doesn't take every day)  . ranolazine (RANEXA) 1000 MG SR tablet Take 1 tablet (1,000 mg total) by mouth twice daily, as directed.  . triamcinolone cream (KENALOG) 0.1 % Apply 1 application topically 2 (two) times daily as needed.  . TURMERIC PO Take 2 capsules by mouth daily.  . vitamin B-12 (CYANOCOBALAMIN) 500 MCG tablet Take 500 mcg by mouth daily.  . [DISCONTINUED] lisinopril (PRINIVIL,ZESTRIL) 2.5 MG tablet TAKE 1 TABLET (2.5 MG TOTAL) BY MOUTH DAILY.    Allergies  Allergen Reactions  . Statins   . Zetia [Ezetimibe]   . Clopidogrel Other (See Comments)    Disoriented   . Codeine Rash    Social History   Social History  . Marital status: Married    Spouse name: N/A  . Number of children: N/A  . Years of education: N/A   Social History Main Topics  . Smoking status: Former Smoker    Packs/day: 0.50    Years: 20.00    Types: Cigarettes    Quit date: 04/28/2012  . Smokeless tobacco: Never Used     Comment: long time smoker, smokes occasional -anxiety  . Alcohol use 0.0 oz/week     Comment: occasionally  . Drug use: No  . Sexual activity: Yes   Other Topics Concern  . None   Social History Narrative   Caffeine: 1 cup coffee in am   Lives with wife, 2 dogs   Married, 2 Grown children, 3 grandchildren   Occupation: disability from cardiac status for last year, prior worked for [employer]   Activity: walks driveway, limited by chest pain/SOB -- is trying to pick up his activity.   Diet: good water, fruits/vegetables daily    family history includes Coronary artery disease in his paternal uncle; Diabetes in his paternal aunt; Stroke in his maternal uncle.  Wt Readings from Last 3 Encounters:  04/14/17 243 lb (110.2 kg)  10/13/16 236 lb 12 oz (107.4 kg)  09/29/16 236 lb (107 kg)    PHYSICAL EXAM BP 120/80   Pulse 86   Ht _0  (1.854 m)   Wt  243 lb (110.2 kg)   BMI 32.06 kg/m  General appearance: alert, cooperative, appears stated age, no distress And borderline obesity. Pleasant albeit mildly flat and depressed mood and affect. HEENT: [place]/AT, EOMI, MMM, anicteric sclera Neck: no adenopathy,  no carotid bruit and no JVD Lungs: CTA B, nonlabored, good air movement. Normal percussion. Heart: RRR with normal S1 and S2. No M/R/3. Nondisplaced PMI.  Abdomen: soft, non-tender; bowel sounds normal; no masses,  no organomegaly; no HJR  Extremities: extremities normal, atraumatic, no cyanosis, and edema - trace bilaterally mostly related to varicose veins  Pulses: 2+ and symmetric;  Skin: mobility and turgor normal, no evidence of bleeding or bruising, no lesions noted and Mild erythematous swollen changes in the ankles bilaterally. Neurologic: Mental status: Alert, oriented, thought content appropriate    Adult ECG Report NSR, 86 BPM. Mild nonspecific ST and T-wave changes with T wave inversions in aVL. Normal axis, intervals and durations. Low voltage in limb leads. Stable EKG.   Other studies Reviewed: Additional studies/ records that were reviewed today include:  Recent Labs:   Lab Results  Component Value Date   CHOL 189 02/03/2017   HDL 32 (L) 02/03/2017   LDLCALC 107 (H) 02/03/2017   LDLDIRECT 158.0 09/29/2016   TRIG 251 (H) 02/03/2017   CHOLHDL 5.9 (H) 02/03/2017   Lab Results  Component Value Date   CREATININE 1.00 02/03/2017   BUN 8 02/03/2017   NA 139 02/03/2017   K 4.5 02/03/2017   CL 103 02/03/2017   CO2 24 02/03/2017   ASSESSMENT / PLAN: Problem List Items Addressed This Visit    3-vessel CAD (Chronic)    Pretty much occluded coronary arteries native vessels. He is on pravastatin which is the only statin he's been with tolerate along with Effient and Ranexa.  Graft dependent with only 2 patent grafts. Unfortunately his Down syndrome vessels are not great targets either. He is quite likely can have chronic  anginal symptoms. Unfortunately, he really did not have that much in the way of revascularization options      Relevant Medications   losartan (COZAAR) 25 MG tablet   Atherosclerotic heart disease of native coronary artery with angina pectoris (HCC) (Chronic)   Relevant Medications   losartan (COZAAR) 25 MG tablet   Other Relevant Orders   EKG 12-Lead   CAD (coronary artery disease) of bypass graft (Chronic)    Long-standing to have 4 grafts occlusion. With no recurrent anginal symptoms, and normally functioning LIMA to disease LAD and radial to OM 2, he is on as best dose medications to begin him on with tolerance. He is takingl, ACE inhibitor and Ranexa. Also on statin and Effient.      Relevant Medications   losartan (COZAAR) 25 MG tablet   Depressed mood (Chronic)    He does seem chronically depressed and I think he probably would benefit from an SSRI. I will defer this to his PCP, but it seemed like this is not getting through. He needs more than just when necessary medications.      Essential hypertension (Chronic)    His blood pressure actually looks stable on current medications. For now I don't think we need to adjust unless his chest things worse. If that occurs I would switch him from lisinopril to an ACE inhibitor.      Relevant Medications   losartan (COZAAR) 25 MG tablet   Hyperlipidemia with target LDL less than 70 with statin related symptoms (Chronic)    He finally says to be tolerating low-dose pravastatin and now is on at least moderate dose. Continue to monitor but he may need more aggressive evaluated by our lipid clinic.      Relevant Medications   losartan (COZAAR) 25  MG tablet   Other Relevant Orders   EKG 12-Lead   Hypogonadism male (Chronic)    Honestly, given the amount of exercise he is not getting because of fatigue, I think if necessary to put him on testosterone supplementation, I would do so. I think we would repeat the benefits and be able to  potentially handle risks if they occur.      Ischemic cardiomyopathy (Chronic)    Reduced EF by Myoview and echocardiogram back in 2013. I do think is some exertional dyspnea he has is related to reduce pump function as well as potentially anginal symptoms. He clearly also has diastolic dysfunction. However he does seem euvolemic today with exertion of his varicose veins. Had not previously been on diuretic.  Continue to recommend support stockings/compression hose and for now would avoid diuretic.      Relevant Medications   losartan (COZAAR) 25 MG tablet   Other Relevant Orders   EKG 12-Lead   Stable angina: Class I-II (Chronic)    He seems to be pretty stable overall. Now I think we can continue with his current dose of Ranexa. If he is actually able to get coverage for Ranexa, we can probably increase his dosage. He did not do well with nitrates, however we can retry Imdur if necessary. He continues to be borderline hypotensive, and therefore is not on much the way of any aunt anginal blood pressure medications. If necessary we could switch from lisinopril to amlodipine. We had stopped his beta blockers due to fatigue.      Relevant Medications   losartan (COZAAR) 25 MG tablet   Other Relevant Orders   EKG 12-Lead   Statin intolerance (Chronic)    He was not able to afford Repatha/Praluent on initial evaluation. We will pharmacy team talked to him about the potential of other trials that are out there.      Varicose veins of leg with swelling - Primary (Chronic)    Overall stable. He does wear compression stockings during wintertime, but when his hot he doesn't like to wear it. I recommend that he wear the lighter weight knee-high's socks during summer and in the heavier weight in the winter. Since he is having issues with dry cough, we'll switch from lisinopril to losartan      Relevant Medications   losartan (COZAAR) 25 MG tablet      Current medicines are reviewed at  length with the patient today. (+/- concerns) cough - ? lisinoprili The following changes have been made: n/a  Patient Instructions  Change in medication  Finish lisinopril then stop  Start Losartan 25 mg take at bedtime.  Wear light support knee hi - during summer  Your physician wants you to follow-up in 6 months with Dr Ellyn Hack.You will receive a reminder letter in the mail two months in advance. If you don't receive a letter, please call our office to schedule the follow-up appointment.   If you need a refill on your cardiac medications before your next appointment, please call your pharmacy.    Studies Ordered:   Orders Placed This Encounter  Procedures  . EKG 12-Lead      Glenetta Hew, M.D., M.S. Interventional Cardiologist   Pager # [phone] Phone # [phone] [address] [place], [place] [address]

## 2017-04-14 NOTE — Assessment & Plan Note (Signed)
Overall stable. He does wear compression stockings during wintertime, but when his hot he doesn't like to wear it. I recommend that he wear the lighter weight knee-high's socks during summer and in the heavier weight in the winter. Since he is having issues with dry cough, we'll switch from lisinopril to losartan

## 2017-04-14 NOTE — Assessment & Plan Note (Signed)
Long-standing to have 4 grafts occlusion. With no recurrent anginal symptoms, and normally functioning LIMA to disease LAD and radial to OM 2, he is on as best dose medications to begin him on with tolerance. He is takingl, ACE inhibitor and Ranexa. Also on statin and Effient.

## 2017-04-14 NOTE — Patient Instructions (Signed)
Change in medication  Finish lisinopril then stop  Start Losartan 25 mg take at bedtime.  Wear light support knee hi - during summer  Your physician wants you to follow-up in 6 months with Dr Herbie Baltimore.You will receive a reminder letter in the mail two months in advance. If you don't receive a letter, please call our office to schedule the follow-up appointment.   If you need a refill on your cardiac medications before your next appointment, please call your pharmacy.

## 2017-04-14 NOTE — Assessment & Plan Note (Signed)
He was not able to afford Repatha/Praluent on initial evaluation. We will pharmacy team talked to him about the potential of other trials that are out there.

## 2017-04-14 NOTE — Assessment & Plan Note (Signed)
Pretty much occluded coronary arteries native vessels. He is on pravastatin which is the only statin he's been with tolerate along with Effient and Ranexa.  Graft dependent with only 2 patent grafts. Unfortunately his Down syndrome vessels are not great targets either. He is quite likely can have chronic anginal symptoms. Unfortunately, he really did not have that much in the way of revascularization options

## 2017-04-14 NOTE — Assessment & Plan Note (Signed)
Honestly, given the amount of exercise he is not getting because of fatigue, I think if necessary to put him on testosterone supplementation, I would do so. I think we would repeat the benefits and be able to potentially handle risks if they occur.

## 2017-04-14 NOTE — Assessment & Plan Note (Signed)
His blood pressure actually looks stable on current medications. For now I don't think we need to adjust unless his chest things worse. If that occurs I would switch him from lisinopril to an ACE inhibitor.

## 2017-04-29 ENCOUNTER — Encounter: Payer: Self-pay | Admitting: Family Medicine

## 2017-04-29 ENCOUNTER — Ambulatory Visit (INDEPENDENT_AMBULATORY_CARE_PROVIDER_SITE_OTHER): Payer: Medicare HMO | Admitting: Family Medicine

## 2017-04-29 VITALS — BP 126/88 | HR 83 | Temp 98.3°F | Ht 73.0 in | Wt 242.0 lb

## 2017-04-29 DIAGNOSIS — R51 Headache: Secondary | ICD-10-CM

## 2017-04-29 DIAGNOSIS — Z789 Other specified health status: Secondary | ICD-10-CM

## 2017-04-29 DIAGNOSIS — R519 Headache, unspecified: Secondary | ICD-10-CM | POA: Insufficient documentation

## 2017-04-29 MED ORDER — FLUTICASONE PROPIONATE 50 MCG/ACT NA SUSP: 2.0000 | Freq: Every day | NASAL | 1 refills | Status: DC

## 2017-04-29 MED ORDER — COLCRYS 0.6 MG PO TABS: ORAL_TABLET | ORAL | 0 refills | Status: DC

## 2017-04-29 MED ORDER — COENZYME Q10 30 MG PO CAPS: 30.0000 mg | ORAL_CAPSULE | Freq: Every day | ORAL | Status: DC

## 2017-04-29 NOTE — Patient Instructions (Addendum)
Possible losartan contribution - try 1/2 tablet at night, but return to whole tablet if blood pressure starts increasing.  Possible medication overuse headache - I'm glad we've backed off ibuprofen.  For possible sinus component - try flonase nasal steroid for 1 week.  Let us know if ongoing headache.

## 2017-04-29 NOTE — Progress Notes (Signed)
BP 126/88 (BP Location: Right Arm, Cuff Size: Large)   Pulse 83   Temp 98.3 F (36.8 C)   Ht  (1.854 m)   Wt 242 lb (109.8 kg)   SpO2 98%   BMI 31.93 kg/m    CC: HA Subjective:    Patient ID: Calvin Francis, male    DOB: 1961-08-28, 56 y.o.   MRN: [id number]  HPI: Calvin Francis is a 56 y.o. male presenting on 04/29/2017 for Headache (HA comes and goes, had a stomach virus last week with vomiting nand diarrhea for 24hrs. hx of migraines )   Extensive CAD history s/p CABG now medically managed on pravastatin, effient, ranexa followed by cardiology.   3 wk h/o intermittent headaches described as throbbing behind both eyes associated with blurred vision. Can wake up with headache but headache doesn't wake him up. No nausea, photo/phonophobia. No significant congestion. No fevers/chills, cough. He has been taking ibuprofen regularly. Has tried loratadine  daily which may help.   He did have stomach virus last week with nausea/vomiting/diarrhea. This has largely resolved.   H/o migraines that resolved when he was in his 91s. This feels different.  He does not regularly take nitroglycerin.   He was recently changed from lisinopril 2.5mg  to losartan  daily due to chronic daily cough.   Relevant past medical, surgical, family and social history reviewed and updated as indicated. Interim medical history since our last visit reviewed. Allergies and medications reviewed and updated. Outpatient Medications Prior to Visit  Medication Sig Dispense Refill  . albuterol (PROVENTIL HFA;VENTOLIN HFA) 108 (90 BASE) MCG/ACT inhaler Inhale 2 puffs into the lungs every 4 (four) hours as needed for wheezing or shortness of breath. 1 Inhaler 0  . ALPRAZolam (XANAX) 0.25 MG tablet Take 1 tablet (0.25 mg total) by mouth daily as needed for anxiety. 30 tablet 0  . APPLE CIDER VINEGAR PO Take by mouth.    . fluorouracil (EFUDEX) 5 % cream Apply topically 2 (two) times daily. 40 g 1  . losartan  (COZAAR) 25 MG tablet Take 1 tablet (25 mg total) by mouth daily. 90 tablet 3  . nitroGLYCERIN (NITROSTAT) 0.4 MG SL tablet Place 1 tablet (0.4 mg total) under the tongue every 5 (five) minutes as needed for chest pain. 25 tablet 0  . omeprazole (PRILOSEC OTC) 20 MG tablet Take 20 mg by mouth daily.    . prasugrel (EFFIENT) 10 MG TABS tablet Take 1 tablet (10 mg total) by mouth daily. 90 tablet 0  . pravastatin (PRAVACHOL) 40 MG tablet Take 1 tablet (40 mg total) by mouth daily. (Patient taking differently: Take 40 mg by mouth every other day. Pt states he doesn't take every day) 90 tablet 3  . ranolazine (RANEXA) 1000 MG SR tablet Take 1 tablet (1,000 mg total) by mouth twice daily, as directed. 180 tablet 3  . triamcinolone cream (KENALOG) 0.1 % Apply 1 application topically 2 (two) times daily as needed.    . TURMERIC PO Take 2 capsules by mouth daily.    . vitamin B-12 (CYANOCOBALAMIN) 500 MCG tablet Take 500 mcg by mouth daily.    . colchicine (COLCRYS) 0.6 MG tablet TAKE AS DIRECTED 2 TABLETS BY MOUTH ON FIRST DAY THEN ONE DAILY AS NEEDED **Schedule physical for further refills** 30 tablet 0   No facility-administered medications prior to visit.      Per HPI unless specifically indicated in ROS section below Review of Systems  Objective:    BP 126/88 (BP Location: Right Arm, Cuff Size: Large)   Pulse 83   Temp 98.3 F (36.8 C)   Ht  (1.854 m)   Wt 242 lb (109.8 kg)   SpO2 98%   BMI 31.93 kg/m   Wt Readings from Last 3 Encounters:  04/29/17 242 lb (109.8 kg)  04/14/17 243 lb (110.2 kg)  10/13/16 236 lb 12 oz (107.4 kg)    Physical Exam  Constitutional: He is oriented to person, place, and time. He appears well-developed and well-nourished. No distress.  HENT:  Head: Normocephalic and atraumatic.  Right Ear: Hearing, tympanic membrane, external ear and ear canal normal.  Left Ear: Hearing, tympanic membrane, external ear and ear canal normal.  Nose: Nose normal. No  mucosal edema or rhinorrhea. Right sinus exhibits no maxillary sinus tenderness and no frontal sinus tenderness. Left sinus exhibits no maxillary sinus tenderness and no frontal sinus tenderness.  Mouth/Throat: Uvula is midline, oropharynx is clear and moist and mucous membranes are normal. No oropharyngeal exudate, posterior oropharyngeal edema, posterior oropharyngeal erythema or tonsillar abscesses.  Some dry nasal mucosal mucous  Eyes: Conjunctivae and EOM are normal. Pupils are equal, round, and reactive to light. No scleral icterus.  Neck: Normal range of motion. Neck supple. Carotid bruit is not present. No thyromegaly present.  No vertebral bruit  Cardiovascular: Normal rate, regular rhythm, normal heart sounds and intact distal pulses.   No murmur heard. Pulmonary/Chest: Effort normal and breath sounds normal. No respiratory distress. He has no wheezes. He has no rales.  Musculoskeletal: He exhibits no edema.  Lymphadenopathy:    He has no cervical adenopathy.  Neurological: He is alert and oriented to person, place, and time.  CN 2-12 intact FTN intact EOMI  Skin: Skin is warm and dry. No rash noted.  Psychiatric: He has a normal mood and affect.  Nursing note and vitals reviewed.  Results for orders placed or performed in visit on 09/29/16  Lipid panel  Result Value Ref Range   Cholesterol 247 (H) 0 - 200 mg/dL   Triglycerides 161.0 (H) 0.0 - 149.0 mg/dL   HDL 96.04 >54.09 mg/dL   VLDL 81.1 (H) 0.0 - 91.4 mg/dL   Total CHOL/HDL Ratio 6    NonHDL 205.80   Comprehensive metabolic panel  Result Value Ref Range   Sodium 138 135 - 145 mEq/L   Potassium 4.4 3.5 - 5.1 mEq/L   Chloride 103 96 - 112 mEq/L   CO2 28 19 - 32 mEq/L   Glucose, Bld 88 70 - 99 mg/dL   BUN 12 6 - 23 mg/dL   Creatinine, Ser 7.82 0.40 - 1.50 mg/dL   Total Bilirubin 0.5 0.2 - 1.2 mg/dL   Alkaline Phosphatase 56 39 - 117 U/L   AST 20 0 - 37 U/L   ALT 22 0 - 53 U/L   Total Protein 6.4 6.0 - 8.3 g/dL    Albumin 4.0 3.5 - 5.2 g/dL   Calcium 8.9 8.4 - 95.6 mg/dL   GFR 21.30 >86.57 mL/min  Hemoglobin A1c  Result Value Ref Range   Hgb A1c MFr Bld 5.8 4.6 - 6.5 %  Hepatitis C antibody  Result Value Ref Range   HCV Ab NEGATIVE NEGATIVE  HIV antibody  Result Value Ref Range   HIV 1&2 Ab, 4th Generation NONREACTIVE NONREACTIVE  PSA, Medicare  Result Value Ref Range   PSA 0.41 0.10 - 4.00 ng/ml  CBC with Differential/Platelet  Result Value  Ref Range   WBC 6.9 4.0 - 10.5 K/uL   RBC 4.61 4.22 - 5.81 Mil/uL   Hemoglobin 13.6 13.0 - 17.0 g/dL   HCT 16.1 09.6 - 04.5 %   MCV 85.8 78.0 - 100.0 fl   MCHC 34.5 30.0 - 36.0 g/dL   RDW 40.9 81.1 - 91.4 %   Platelets 184.0 150.0 - 400.0 K/uL   Neutrophils Relative % 62.3 43.0 - 77.0 %   Lymphocytes Relative 27.9 12.0 - 46.0 %   Monocytes Relative 5.4 3.0 - 12.0 %   Eosinophils Relative 4.0 0.0 - 5.0 %   Basophils Relative 0.4 0.0 - 3.0 %   Neutro Abs 4.3 1.4 - 7.7 K/uL   Lymphs Abs 1.9 0.7 - 4.0 K/uL   Monocytes Absolute 0.4 0.1 - 1.0 K/uL   Eosinophils Absolute 0.3 0.0 - 0.7 K/uL   Basophils Absolute 0.0 0.0 - 0.1 K/uL  LDL cholesterol, direct  Result Value Ref Range   Direct LDL 158.0 mg/dL      Assessment & Plan:  Over 25 minutes were spent face-to-face with the patient during this encounter and >50% of that time was spent on counseling and coordination of care  Problem List Items Addressed This Visit    Headache - Primary    Nonfocal neurological exam.  Possible medication related (losartan is only new medicine) - suggested he cut to 1/2 tab daily (12.5mg  daily) and update with effect.  Possible medication overuse headache - encouraged he back off regular ibuprofen use.  Possible sinus congestion headache worse in spring - will trial flonase and continue loratadine .       Relevant Medications   COLCRYS 0.6 MG tablet   Statin intolerance (Chronic)    Pravastatin better tolerated but still with some muscle pain/cramping.  Suggested he start coq10 supplement daily.           Follow up plan: Return if symptoms worsen or fail to improve.  Eustaquio Boyden, MD

## 2017-04-29 NOTE — Assessment & Plan Note (Signed)
Pravastatin better tolerated but still with some muscle pain/cramping. Suggested he start coq10 supplement daily.

## 2017-04-29 NOTE — Assessment & Plan Note (Addendum)
Nonfocal neurological exam.  Possible medication related (losartan is only new medicine) - suggested he cut to 1/2 tab daily (12.5mg  daily) and update with effect.  Possible medication overuse headache - encouraged he back off regular ibuprofen use.  Possible sinus congestion headache worse in spring - will trial flonase and continue loratadine .

## 2017-04-29 NOTE — Progress Notes (Signed)
Pre visit review using our clinic review tool, if applicable. No additional management support is needed unless otherwise documented below in the visit note. 

## 2017-06-12 ENCOUNTER — Telehealth: Payer: Self-pay | Admitting: Cardiology

## 2017-06-12 NOTE — Telephone Encounter (Signed)
Patient called stating he was recently stitched from Lisinopril 2.5mg  to Losartan 25mg  qd; patient is requesting to restart Lisinopril and discontinue Losartan due to some side effects. Patient also states he has not been taken any Losartan in the past month and restarted the few tablets of Lisinopril he had left. He wants a refill on the Lisinopril. Please advise.

## 2017-06-12 NOTE — Telephone Encounter (Signed)
New message       Pt c/o medication issue:  1. Name of Medication: lisinopril  2. How are you currently taking this medication (dosage and times per day)? 2.5mg  daily 3. Are you having a reaction (difficulty breathing--STAT)?  4. What is your medication issue? Pt was taking losartan 25mg  but states that it was "not working" so he stopped this medication 1 month ago.  He had lisinopril 2.5mg  pres left and started taking this medication.  He stopped lisinopril because he developed a cough. He has not been keeping up with his bp but states losartan was not working.  Pt is now out of lisinopril and want a 30 day supply called in to [facility] at [address] and a presc called in to [facility] mail order pharmacy. Call if there is a problem

## 2017-06-13 NOTE — Telephone Encounter (Signed)
Interesting because he was switched to losartan because of cough on lisinopril. I'm not sure what side effects he is having.  I'm not sure what exactly he means by "losartan not working". It is simply that his blood pressure is higher, I would increase the dose and not changed back to medicine that was causing the cough.  As please clarify this. Part of the reason for switching of the AR[facility] was that [facility]. I need to know what side effects he is having a losartan. If it is simply that is not getting his blood pressure down, I would increase it to 50 mg instead of stopping. Bryan Lemmaavid Kitty Cadavid, MD

## 2017-06-15 NOTE — Telephone Encounter (Signed)
Follow up    Pt just following up on switching the medication back to lisinopril,  He was having problems with dizziness, fatigue, severe headache for 2 weeks  and dropping his bp really low on the losartan, that is why he wanted to go back to lisinopril.

## 2017-06-15 NOTE — Telephone Encounter (Signed)
We can just go back to Lisinopril then @ previous dose.  Bryan Lemmaavid Harding, MD

## 2017-06-15 NOTE — Telephone Encounter (Signed)
Returned the phone call to the patient to get specific information concerning the Losartan 25 mg. He stated that he couldn't really "remember his symptoms" other than a bad headache and dizziness. He stated that he took his blood pressure once while on the Losartan but couldn't remember what it was but it didn't "have any red flags." He said the cough was mild with the Lisinopril and he would rather have the cough then feel bad with the Losartan. He has not had the Losartan in three weeks. He stated that he was willing to try whatever the provider thought was best. This was the only information he was able to provide.

## 2017-06-16 MED ORDER — LISINOPRIL 2.5 MG PO TABS
2.5000 mg | ORAL_TABLET | Freq: Every day | ORAL | 0 refills | Status: DC
Start: 2017-06-16 — End: 2017-06-16

## 2017-06-16 MED ORDER — LISINOPRIL 2.5 MG PO TABS: 2.5000 mg | ORAL_TABLET | Freq: Every day | ORAL | 3 refills | Status: DC

## 2017-06-16 NOTE — Telephone Encounter (Signed)
Spoke to the patient to inform him that he could go back on Lisinopril 2.5 mg daily, per Dr. Herbie BaltimoreHarding. Losartan has been discontinued and the Lisinopril 2.5 mg has been filled. Per the patient request, [facility] and then a 90 day has been filled with the mail order. The patient state that it takes two weeks for [facility] to mail the order so he needed a supply to get him through until then.

## 2017-09-03 ENCOUNTER — Ambulatory Visit (INDEPENDENT_AMBULATORY_CARE_PROVIDER_SITE_OTHER): Payer: Medicare HMO | Admitting: Cardiology

## 2017-09-03 ENCOUNTER — Encounter: Payer: Self-pay | Admitting: Cardiology

## 2017-09-03 VITALS — BP 122/82 | HR 80 | Ht 74.0 in | Wt 249.0 lb

## 2017-09-03 DIAGNOSIS — Z789 Other specified health status: Secondary | ICD-10-CM | POA: Diagnosis not present

## 2017-09-03 DIAGNOSIS — I208 Other forms of angina pectoris: Secondary | ICD-10-CM | POA: Diagnosis not present

## 2017-09-03 DIAGNOSIS — I25119 Atherosclerotic heart disease of native coronary artery with unspecified angina pectoris: Secondary | ICD-10-CM

## 2017-09-03 DIAGNOSIS — E785 Hyperlipidemia, unspecified: Secondary | ICD-10-CM | POA: Diagnosis not present

## 2017-09-03 DIAGNOSIS — I255 Ischemic cardiomyopathy: Secondary | ICD-10-CM

## 2017-09-03 MED ORDER — RANOLAZINE ER 1000 MG PO TB12: ORAL_TABLET | ORAL | 3 refills | Status: DC

## 2017-09-03 MED ORDER — BISOPROLOL FUMARATE 5 MG PO TABS: 2.5000 mg | ORAL_TABLET | Freq: Every day | ORAL | 6 refills | Status: DC

## 2017-09-03 NOTE — Patient Instructions (Signed)
Medication addition START BISOPROLOL 2.5 MG (1/2 TABLET OF 5 MG) ONCE DAILY   LABS  CMP LIPID- DO NOT EAT OR DRINK THE MORNING OF THE TEST.   Your physician wants you to follow-up in 6 MONTHS WITH DR HARDING.You will receive a reminder letter in the mail two months in advance. If you don't receive a letter, please call our office to schedule the follow-up appointment.

## 2017-09-03 NOTE — Progress Notes (Signed)
PCP: Calvin Bush, MD  Clinic Note: Chief Complaint  Patient presents with  . Follow-up    having some frequent chest pain,   . Coronary Artery Disease    Chronic class II angina; no good revascularization options  . Cardiomyopathy    Ischemic EF of 40%    HPI: Calvin Francis is a 56 y.o. male with a PMH below who presents today for for 5 month follow-up for his long-standing history of CAD-CABG with severe graft disease and chronic stable angina with cardiomyopathy. He also has difficult control lipids Former patient Dr. Aldona Francis -> on minimum December 2011 with a non-STEMI. PCI to an SVG-OM1 which is now totally occluded.  Problem List  1. Stable angina: Class I-II --> On split dose Ranexa ; no good revascularizing options   2. 3-vessel CAD: Atherosclerosis of native coronary artery of native heart with angina pectoris ([place]) - now graft dependent on 2 grafts. Downstream vessels are not good targets for PCI   3. S/P CABG x 4   4. Coronary artery disease involving coronary bypass graft with other forms of angina pectoris ([place])   5. Ischemic cardiomyopathy - EF roughly 40% as of November 2013  6. Hyperlipidemia with target LDL less than 70 with statin related symptoms   7. Statin intolerance   8. Essential hypertension   9. Varicose veins of leg with swelling, bilateral   10. Smoker     Calvin Francis was last seen on April 14 2017 - still had chronic stable angina.  I was hoping to convert him to an ARB with thoughts of converting to [facility] As well as because of cough. He did not tolerate losartan so we put him back on low-dose lisinopril. He was trying to go for walks  Recent Hospitalizations: None  Studies Personally Reviewed - (if available, images/films reviewed: From Epic Chart or Care Everywhere)  n/a  Interval History: Calvin Francis returns today stating he is having more anginal pain than usual. He is trying to take his Ranexa once a day as opposed to twice a day, and  is noticing more more is having to take the second dose. He says that shortly after taking the second dose his angina improved dramatically. Without it, he is noticing more frequent anginal symptoms and is worried about things getting worse. He definitely does not do well in the heat and has not really done much outside during the summertime. He is also noticing continued fatigue but still has palpitations.  From the palpitations and his angina resolved around worsening anxiety.  In addition to his exertional angina, he does have exertional dyspnea, which is relatively stable. He really does not have heart failure symptoms per se of PND or orthopnea, but does still have intermittent edema.  He denies any localized weakness or dizziness, syncope/near syncope or TIA/amaurosis fugax.  No melena, hematochezia, hematuria, or epstaxis. No claudication.  ROS: A comprehensive was performed. Review of Systems  Constitutional: Negative for weight loss.  Respiratory: Positive for cough (Persistent dry hacking).   Cardiovascular: Positive for orthopnea.  Genitourinary:       Erectile dysfunction  Musculoskeletal: Positive for joint pain (Bilateral Knee pain - popping & going out of joint).       Cramps  Endo/Heme/Allergies: Positive for environmental allergies. Bruises/bleeds easily.  Psychiatric/Behavioral: Positive for depression. The patient is nervous/anxious.   All other systems reviewed and are negative.  I have reviewed and (if needed) personally updated the patient's problem list,  medications, allergies, past medical and surgical history, social and family history.   Past Medical History:  Diagnosis Date  . 3-vessel CAD 02/22/2004   mid RCA 100% occluded, L-R collaterals; LAD 60-70% bifurcation lesion; Cx proximal 70% and mid 80% after OM1, OM1 100% occluded. --> CABG x 4  . Barrett's esophagus    s/p EGD several years ago with [place]  . Bronchitis, mucopurulent recurrent ([place])   . CAD  (coronary artery disease) of bypass graft 5/'12; 12/'13   Cath for angina and Abn Myoview ST: SVG-OM1 now occluded, attempt at PCI to the native circumflex OM1 unsuccessful; distal LAD beyond patent LIMA 70-80% (not PCI amenable); free radial-OM 2 patent; EF 45-50%  . Erectile dysfunction   . Former heavy cigarette smoker (20-39 per day) May 2013   . GERD (gastroesophageal reflux disease)    ?barrett's, with esophageal dysmotility, on omeprazole  . Glucose intolerance (impaired glucose tolerance)  2009   Prediabetes  . Gout   . History of Non-STEMI (non-ST elevated myocardial infarction) 02/22/2004   Three-vessel disease --> referred for CABG  . History of Non-STEMI (non-ST elevated myocardial infarction) December 2011   Hazy lesion in SVG-OM1 --> staged PCI with 4.0 mm x 20 mm vision BMS (4.5 mm)  . HLD (hyperlipidemia)    Statin intolerant  . Hypertension, benign   . Ischemic cardiomyopathy Echo 10/2012   EF ~40%; moderate Posterior HypoKinesis, minld-moderate inferior Hypokinesis; mild RV dilation, mild concentric LVH  . 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 Stable angina ([place]) 11/2012   Chronic,some what stable but still present; cardiac cath results above, no significant change from 2012  . Testosterone deficiency    On replacement; goal is low normal.  . Varicose veins December 2013   with venous reflux status VNUS ablation of left greater saphenous wein    Past Surgical History:  Procedure Laterality Date  . CARDIAC CATHETERIZATION  May 01 2011   knowwn occlusion of SVG to RCA  as well as native RCA ; PCI to the SVG to OM1 ;patent LIMA to LAD with diffuse distal  LAD 70-80%,small  vessel dx not thought to be amenable to PCI .RCA 100% occluded ,OM2 patent,Circ diseased aftr OM1. EF 45-50%  . CORONARY ANGIOPLASTY WITH STENT PLACEMENT  12/25/2010   s/p autologous vessel blockage  . CORONARY ARTERY BYPASS GRAFT  02/27/04   LIMA -LAD, Free  Radial-OM2 -- patent; SVG-OM1, SVG-RCA, - 100% occluded by recent cath  . ESOPHAGOGASTRODUODENOSCOPY    . LEFT HEART CATHETERIZATION WITH CORONARY/GRAFT ANGIOGRAM N/A 12/27/2012   Procedure: LEFT HEART CATHETERIZATION WITH Beatrix Fetters;  Surgeon: Leonie Man, MD;  Location: [facility] CATH LAB: Known nRCA, mLAD, OM1 & OM2 100%, Known SVG-RCA 100%, SVG-OM1 100% ISR. Patent LIMA-dLAD w/ 60-80% dLAD. Patent frRAD-OM2. LCx-RPL collaterals.   EF ~35%  . lower venous extremity doppler Left 08/15/2011   normal ,s/p vein ablation  . MET/CPET  11/02/2012   suboptimal effort but peak VO2  was 52% which relatively significant. Myoview was suggestive for ant. ischemia may go along with his distal LAD   . NM MYOCAR PERF WALL MOTION  12/15/2012   high risk study  . TRANSTHORACIC ECHOCARDIOGRAM  10/2012   mild LVH, EF 40%, mod posterior and inf wall hypokinesis,mild concentric LVH;; RV dilated, normal  fx.trace MR  . VEIN SURGERY  2011   solomon - h/o vericose veins    Current Meds  Medication Sig  . albuterol (PROVENTIL HFA;VENTOLIN HFA) 108 (90 BASE) MCG/ACT inhaler Inhale 2 puffs into the lungs every 4 (four) hours as needed for wheezing or shortness of breath.  . ALPRAZolam (XANAX) 0.25 MG tablet Take 1 tablet (0.25 mg total) by mouth daily as needed for anxiety.  . APPLE CIDER VINEGAR PO Take by mouth.  . bisoprolol (ZEBETA) 5 MG tablet Take 0.5 tablets (2.5 mg total) by mouth daily.  Marland Kitchen co-enzyme Q-10 30 MG capsule Take 1 capsule (30 mg total) by mouth daily.  Marland Kitchen COLCRYS 0.6 MG tablet TAKE AS DIRECTED 2 TABLETS BY MOUTH ON FIRST DAY THEN ONE DAILY AS NEEDED  . fluorouracil (EFUDEX) 5 % cream Apply topically 2 (two) times daily.  . fluticasone (FLONASE) 50 MCG/ACT nasal spray Place 2 sprays into both nostrils daily.  Marland Kitchen lisinopril (PRINIVIL,ZESTRIL) 2.5 MG tablet Take 1 tablet (2.5 mg total) by mouth daily.  . nitroGLYCERIN (NITROSTAT) 0.4 MG SL tablet Place 1 tablet (0.4 mg total) under the  tongue every 5 (five) minutes as needed for chest pain.  Marland Kitchen omeprazole (PRILOSEC OTC) 20 MG tablet Take 20 mg by mouth daily.  . prasugrel (EFFIENT) 10 MG TABS tablet Take 1 tablet (10 mg total) by mouth daily.  . pravastatin (PRAVACHOL) 40 MG tablet Take 1 tablet (40 mg total) by mouth daily. (Patient taking differently: Take 40 mg by mouth every other day. Pt states he doesn't take every day)  . ranolazine (RANEXA) 1000 MG SR tablet Take 1 tablet (1,000 mg total) by mouth twice daily, as directed.  . triamcinolone cream (KENALOG) 0.1 % Apply 1 application topically 2 (two) times daily as needed.  . TURMERIC PO Take 2 capsules by mouth daily.  . vitamin B-12 (CYANOCOBALAMIN) 500 MCG tablet Take 500 mcg by mouth daily.  . [DISCONTINUED] ranolazine (RANEXA) 1000 MG SR tablet Take 1 tablet (1,000 mg total) by mouth twice daily, as directed.    Allergies  Allergen Reactions  . Statins   . Zetia [Ezetimibe]   . Clopidogrel Other (See Comments)    Disoriented   . Codeine Rash    Social History   Social History  . Marital status: Married    Spouse name: N/A  . Number of children: N/A  . Years of education: N/A   Social History Main Topics  . Smoking status: Former Smoker    Packs/day: 0.50    Years: 20.00    Types: Cigarettes    Quit date: 04/28/2012  . Smokeless tobacco: Never Used     Comment: long time smoker, smokes occasional -anxiety  . Alcohol use 0.0 oz/week     Comment: occasionally  . Drug use: No  . Sexual activity: Yes   Other Topics Concern  . None   Social History Narrative   Caffeine: 1 cup coffee in am   Lives with wife, 2 dogs   Married, 2 Grown children, 3 grandchildren   Occupation: disability from cardiac status for last year, prior worked for [employer]   Activity: walks driveway, limited by chest pain/SOB    Diet: good water, fruits/vegetables daily    family history includes Coronary artery disease in his paternal uncle; Diabetes in his  paternal aunt; Stroke in his maternal uncle.  Wt Readings from Last 3 Encounters:  09/03/17 249 lb (112.9 kg)  04/29/17 242 lb (109.8 kg)  04/14/17 243 lb (110.2 kg)    PHYSICAL EXAM BP 122/82   Pulse 80   Ht 6' 2"  (1.88  m)   Wt 249 lb (112.9 kg)   BMI 31.97 kg/m  Physical Exam  Constitutional: He is oriented to person, place, and time. He appears well-developed and well-nourished. No distress.  Mild abdominal obesity.  HENT:  Head: Normocephalic and atraumatic.  Eyes: EOM are normal.  Neck: No hepatojugular reflux and no JVD present. Carotid bruit is not present.  Cardiovascular: Normal rate, regular rhythm, normal heart sounds and intact distal pulses.  Exam reveals no gallop and no friction rub.   No murmur heard. Pulmonary/Chest: Effort normal and breath sounds normal. No respiratory distress. He has no wheezes. He has no rales.  Abdominal: Soft. Bowel sounds are normal. He exhibits no distension. There is no tenderness. There is no rebound.  Musculoskeletal: Normal range of motion. He exhibits no edema.  Neurological: He is alert and oriented to person, place, and time.  Skin: Skin is warm and dry. No rash noted. No erythema.  Mild bruising. Mild erythema and swelling/stasis changes in the ankles  Psychiatric: He has a normal mood and affect. His behavior is normal. Judgment and thought content normal.  Flat mood and affect  Nursing note and vitals reviewed.   Adult ECG Report  Rate: 76 ;  Rhythm: normal sinus rhythm and Nonspecific ST and T-wave changes. Otherwise normal axis, intervals and durations.;   Narrative Interpretation: Stable EKG   Other studies Reviewed: Additional studies/ records that were reviewed today include:  Recent Labs:   Lab Results  Component Value Date   CHOL 189 02/03/2017   HDL 32 (L) 02/03/2017   LDLCALC 107 (H) 02/03/2017   LDLDIRECT 158.0 09/29/2016   TRIG 251 (H) 02/03/2017   CHOLHDL 5.9 (H) 02/03/2017    ASSESSMENT /  PLAN: Problem List Items Addressed This Visit    Atherosclerotic heart disease of native coronary artery with angina pectoris ([place]) (Chronic)    Not many revascularization options. He has downstream vessel disease in the grafted vessels and several grafts are occluded. Certainly if symptoms persist beyond but were able to control with Ranexa, we may need to consider at least one more relook. He is on Effient for his stents without aspirin. He is on pravastatin which is the best he was able to tolerate and is having a hard time with myalgias.  I'm adding low-dose beta blocker provided he can tolerate with his fatigue.      Relevant Medications   ranolazine (RANEXA) 1000 MG SR tablet   bisoprolol (ZEBETA) 5 MG tablet   Other Relevant Orders   EKG 12-Lead (Completed)   Comprehensive metabolic panel   Hyperlipidemia with target LDL less than 70 with statin related symptoms (Chronic)    Last labs from February were not at goal on pravastatin. We have tried having him establish with Praluent or Repatha, but financial issues have made this impossible. He indicates that he has been doing better taking pravastatin has been watching his diet. He is due for labs to be rechecked.  Not many options at this point.      Relevant Medications   ranolazine (RANEXA) 1000 MG SR tablet   bisoprolol (ZEBETA) 5 MG tablet   Other Relevant Orders   Lipid panel   Comprehensive metabolic panel   Ischemic cardiomyopathy (Chronic)    Continues to have reduced EF. Doesn't seem to have active heart failure symptoms beyond class II. No significant baseline PND or orthopnea. He is actually not on a diuretic. A lot of his edema seems be related to varicose veins.  Continue to recommend support stockings/shows.  If he starts having symptoms of angina decubitus or PND/orthopnea, would consider adding diuretic, but for now will hold off.      Relevant Medications   ranolazine (RANEXA) 1000 MG SR tablet   bisoprolol  (ZEBETA) 5 MG tablet   Stable angina: Class I-II - Primary (Chronic)    Symptoms seem to be worse now. I would like to try to ramp up his medications as if he can control his symptoms, but did have a low threshold for considering revisiting angiography. If at that time we really don't see many other options, I think we might want to consider referral to our heart failure clinic to discuss possibilities of more aggressive management.  For now we will have him increase his Ranexa to twice a day, continue low-dose lisinopril, but will add low-dose beta blocker in the form of bisoprolol. Hopefully this will help avoid the fatigue issues that occurred with the beta blockers. May need to consider amlodipine as blood pressure tolerates.      Relevant Medications   ranolazine (RANEXA) 1000 MG SR tablet   bisoprolol (ZEBETA) 5 MG tablet   Other Relevant Orders   EKG 12-Lead (Completed)   Statin intolerance (Chronic)   Relevant Orders   Lipid panel   Comprehensive metabolic panel      Current medicines are reviewed at length with the patient today. (+/- concerns) fatigue, palpitations and worsening angina The following changes have been made: start bisoprolol  Patient Instructions  Medication addition START BISOPROLOL 2.5 MG (1/2 TABLET OF 5 MG) ONCE DAILY   LABS  CMP LIPID- DO NOT EAT OR DRINK THE MORNING OF THE TEST.   Your physician wants you to follow-up in [place].You will receive a reminder letter in the mail two months in advance. If you don't receive a letter, please call our office to schedule the follow-up appointment.    Studies Ordered:   Orders Placed This Encounter  Procedures  . Lipid panel  . Comprehensive metabolic panel  . EKG 12-Lead      Glenetta Hew, M.D., M.S. Interventional Cardiologist   Pager # [phone] Phone # [phone] [address] [place], [place] [address]

## 2017-09-05 ENCOUNTER — Encounter: Payer: Self-pay | Admitting: Cardiology

## 2017-09-05 NOTE — Assessment & Plan Note (Signed)
Symptoms seem to be worse now. I would like to try to ramp up his medications as if he can control his symptoms, but did have a low threshold for considering revisiting angiography. If at that time we really don't see many other options, I think we might want to consider referral to our heart failure clinic to discuss possibilities of more aggressive management.  For now we will have him increase his Ranexa to twice a day, continue low-dose lisinopril, but will add low-dose beta blocker in the form of bisoprolol. Hopefully this will help avoid the fatigue issues that occurred with the beta blockers. May need to consider amlodipine as blood pressure tolerates.

## 2017-09-05 NOTE — Assessment & Plan Note (Signed)
Continues to have reduced EF. Doesn't seem to have active heart failure symptoms beyond class II. No significant baseline PND or orthopnea. He is actually not on a diuretic. A lot of his edema seems be related to varicose veins. Continue to recommend support stockings/shows.  If he starts having symptoms of angina decubitus or PND/orthopnea, would consider adding diuretic, but for now will hold off.

## 2017-09-05 NOTE — Assessment & Plan Note (Signed)
Not many revascularization options. He has downstream vessel disease in the grafted vessels and several grafts are occluded. Certainly if symptoms persist beyond but were able to control with Ranexa, we may need to consider at least one more relook. He is on Effient for his stents without aspirin. He is on pravastatin which is the best he was able to tolerate and is having a hard time with myalgias.  I'm adding low-dose beta blocker provided he can tolerate with his fatigue.

## 2017-09-05 NOTE — Assessment & Plan Note (Signed)
Last labs from February were not at goal on pravastatin. We have tried having him establish with Praluent or Repatha, but financial issues have made this impossible. He indicates that he has been doing better taking pravastatin has been watching his diet. He is due for labs to be rechecked.  Not many options at this point.

## 2017-09-17 ENCOUNTER — Encounter: Payer: Self-pay | Admitting: Family Medicine

## 2017-09-17 ENCOUNTER — Ambulatory Visit (INDEPENDENT_AMBULATORY_CARE_PROVIDER_SITE_OTHER): Payer: Medicare HMO | Admitting: Family Medicine

## 2017-09-17 VITALS — BP 124/60 | HR 87 | Temp 98.0°F | Wt 244.0 lb

## 2017-09-17 DIAGNOSIS — M25571 Pain in right ankle and joints of right foot: Secondary | ICD-10-CM | POA: Insufficient documentation

## 2017-09-17 DIAGNOSIS — M79604 Pain in right leg: Secondary | ICD-10-CM | POA: Diagnosis not present

## 2017-09-17 DIAGNOSIS — Z23 Encounter for immunization: Secondary | ICD-10-CM

## 2017-09-17 MED ORDER — PREDNISONE 20 MG PO TABS
ORAL_TABLET | ORAL | 0 refills | Status: DC
Start: 2017-09-17 — End: 2017-10-16

## 2017-09-17 NOTE — Patient Instructions (Addendum)
Flu shot today You may have tendonitis of ankle or possible gout flare. Treat with short prednisone course. Gentle stretching to foot as well.  Look into insoles with more arch support for right foot - as you do have some flat footedness.  I want to rule out arterial circulation trouble - so will check leg study called ABI.

## 2017-09-17 NOTE — Progress Notes (Signed)
BP 124/60 (BP Location: Left Arm, Patient Position: Sitting, Cuff Size: Normal)   Pulse 87   Temp 98 F (36.7 C) (Oral)   Wt 244 lb (110.7 kg)   SpO2 97%   BMI 31.33 kg/m    CC: R ankle pain Subjective:    Patient ID: Calvin Francis, male    DOB: 03-29-1961, 56 y.o.   MRN: [id number]  HPI: Calvin Francis is a 56 y.o. male presenting on 09/17/2017 for Ankle Pain (Right ankle pain. Started about 1 mo ago. Feels like a sprain but says he has h/o gout. Taken ibuprofen, barely helpful)   1 mo h/o R lateral ankle pain that comes and goes. One day so sore he can barely walk, but next day may feel fine. Feels like a sprain. No redness, swelling or warmth of ankle. Denies inciting trauma/injury or falls.   Also endorses L calf pain with going up stairs or up a hill. Improved with sitting and resting.   Treating with aleve with minimal improvement. Has also tried colcrys without improvement. Also tried some blue ointment which helps.  Lab Results  Component Value Date   LABURIC 7.4 11/18/2013    Discussing possible heart transplant for severe CAD s/p prior interventions.  Relevant past medical, surgical, family and social history reviewed and updated as indicated. Interim medical history since our last visit reviewed. Allergies and medications reviewed and updated. Outpatient Medications Prior to Visit  Medication Sig Dispense Refill  . albuterol (PROVENTIL HFA;VENTOLIN HFA) 108 (90 BASE) MCG/ACT inhaler Inhale 2 puffs into the lungs every 4 (four) hours as needed for wheezing or shortness of breath. 1 Inhaler 0  . ALPRAZolam (XANAX) 0.25 MG tablet Take 1 tablet (0.25 mg total) by mouth daily as needed for anxiety. 30 tablet 0  . APPLE CIDER VINEGAR PO Take by mouth.    . bisoprolol (ZEBETA) 5 MG tablet Take 0.5 tablets (2.5 mg total) by mouth daily. 30 tablet 6  . co-enzyme Q-10 30 MG capsule Take 1 capsule (30 mg total) by mouth daily.    Marland Kitchen COLCRYS 0.6 MG tablet TAKE AS DIRECTED 2  TABLETS BY MOUTH ON FIRST DAY THEN ONE DAILY AS NEEDED 30 tablet 0  . fluorouracil (EFUDEX) 5 % cream Apply topically 2 (two) times daily. 40 g 1  . fluticasone (FLONASE) 50 MCG/ACT nasal spray Place 2 sprays into both nostrils daily. 16 g 1  . nitroGLYCERIN (NITROSTAT) 0.4 MG SL tablet Place 1 tablet (0.4 mg total) under the tongue every 5 (five) minutes as needed for chest pain. 25 tablet 0  . omeprazole (PRILOSEC OTC) 20 MG tablet Take 20 mg by mouth daily.    . prasugrel (EFFIENT) 10 MG TABS tablet Take 1 tablet (10 mg total) by mouth daily. 90 tablet 0  . pravastatin (PRAVACHOL) 40 MG tablet Take 1 tablet (40 mg total) by mouth daily. (Patient taking differently: Take 40 mg by mouth every other day. Pt states he doesn't take every day) 90 tablet 3  . ranolazine (RANEXA) 1000 MG SR tablet Take 1 tablet (1,000 mg total) by mouth twice daily, as directed. 180 tablet 3  . triamcinolone cream (KENALOG) 0.1 % Apply 1 application topically 2 (two) times daily as needed.    . TURMERIC PO Take 2 capsules by mouth daily.    . vitamin B-12 (CYANOCOBALAMIN) 500 MCG tablet Take 500 mcg by mouth daily.    Marland Kitchen lisinopril (PRINIVIL,ZESTRIL) 2.5 MG tablet Take 1 tablet (2.5 mg  total) by mouth daily. 90 tablet 3   No facility-administered medications prior to visit.      Per HPI unless specifically indicated in ROS section below Review of Systems     Objective:    BP 124/60 (BP Location: Left Arm, Patient Position: Sitting, Cuff Size: Normal)   Pulse 87   Temp 98 F (36.7 C) (Oral)   Wt 244 lb (110.7 kg)   SpO2 97%   BMI 31.33 kg/m   Wt Readings from Last 3 Encounters:  09/17/17 244 lb (110.7 kg)  09/03/17 249 lb (112.9 kg)  04/29/17 242 lb (109.8 kg)    Physical Exam  Constitutional: He is oriented to person, place, and time. He appears well-developed and well-nourished. No distress.  Musculoskeletal: He exhibits no edema.  Chronic L>R varicosities lower extremities Diminished pulses  bilaterally L foot WNL R foot/ankle:  No pain to palpation at bilateral malleoli Tender with plantar flexion and tender to eversion/dorsiflexion against resistance No reproducible tenderness to palpation lateral ankle or significant tenderness with palpation of peroneal tendon along its course.  Pes planus with collapse of longitudinal arch at right foot  Neurological: He is alert and oriented to person, place, and time.  Skin: Skin is warm and dry. No rash noted.  Nursing note and vitals reviewed.      Assessment & Plan:   Problem List Items Addressed This Visit    Right leg pain - Primary    R lateral ankle pain - possible peroneal tendonitis vs gout flare. Rx prednisone course. Recommended gentle stretching. Recommended longitudinal arch support for noted pes planus on right.  Consider SM referral.  In endorsed claudication, diminished pulses and known severe CAD, will check ABIs to r/o PAD contributing.  Pt agrees with plan.       Relevant Orders   VAS [place] LE ART SEG MULTI (Segm&LE Reynauds)       Follow up plan: Return if symptoms worsen or fail to improve.  Eustaquio Boyden, MD

## 2017-09-17 NOTE — Assessment & Plan Note (Addendum)
R lateral ankle pain - possible peroneal tendonitis vs gout flare. Rx prednisone course. Recommended gentle stretching. Recommended longitudinal arch support for noted pes planus on right.  Consider SM referral.  In endorsed claudication, diminished pulses and known severe CAD, will check ABIs to r/o PAD contributing.  Pt agrees with plan.

## 2017-09-17 NOTE — Addendum Note (Signed)
Addended by: Nanci Pina on: 09/17/2017 12:48 PM   Modules accepted: Orders

## 2017-09-22 ENCOUNTER — Ambulatory Visit (HOSPITAL_COMMUNITY)
Admission: RE | Admit: 2017-09-22 | Discharge: 2017-09-22 | Disposition: A | Discharge status: Home / Self Care | Payer: Medicare HMO | Source: Ambulatory Visit | Attending: Cardiovascular Disease | Admitting: Cardiovascular Disease

## 2017-09-22 ENCOUNTER — Other Ambulatory Visit: Payer: Self-pay | Admitting: Family Medicine

## 2017-09-22 DIAGNOSIS — I739 Peripheral vascular disease, unspecified: Secondary | ICD-10-CM | POA: Insufficient documentation

## 2017-09-22 DIAGNOSIS — R0989 Other specified symptoms and signs involving the circulatory and respiratory systems: Secondary | ICD-10-CM | POA: Insufficient documentation

## 2017-09-22 DIAGNOSIS — M79604 Pain in right leg: Secondary | ICD-10-CM

## 2017-09-30 ENCOUNTER — Other Ambulatory Visit: Payer: Self-pay | Admitting: Cardiology

## 2017-10-11 ENCOUNTER — Other Ambulatory Visit: Payer: Self-pay | Admitting: Family Medicine

## 2017-10-11 ENCOUNTER — Encounter: Payer: Self-pay | Admitting: Family Medicine

## 2017-10-11 DIAGNOSIS — E785 Hyperlipidemia, unspecified: Secondary | ICD-10-CM

## 2017-10-11 DIAGNOSIS — R7303 Prediabetes: Secondary | ICD-10-CM

## 2017-10-11 DIAGNOSIS — M1A9XX0 Chronic gout, unspecified, without tophus (tophi): Secondary | ICD-10-CM

## 2017-10-11 DIAGNOSIS — M109 Gout, unspecified: Secondary | ICD-10-CM | POA: Insufficient documentation

## 2017-10-11 DIAGNOSIS — Z125 Encounter for screening for malignant neoplasm of prostate: Secondary | ICD-10-CM

## 2017-10-13 ENCOUNTER — Other Ambulatory Visit (INDEPENDENT_AMBULATORY_CARE_PROVIDER_SITE_OTHER): Payer: Medicare HMO

## 2017-10-13 DIAGNOSIS — E785 Hyperlipidemia, unspecified: Secondary | ICD-10-CM

## 2017-10-13 DIAGNOSIS — Z125 Encounter for screening for malignant neoplasm of prostate: Secondary | ICD-10-CM

## 2017-10-13 DIAGNOSIS — M1A9XX0 Chronic gout, unspecified, without tophus (tophi): Secondary | ICD-10-CM

## 2017-10-13 DIAGNOSIS — R7303 Prediabetes: Secondary | ICD-10-CM

## 2017-10-14 LAB — COMPREHENSIVE METABOLIC PANEL
AG Ratio: 1.9 (calc) (ref 1.0–2.5)
ALT: 15 U/L (ref 9–46)
AST: 17 U/L (ref 10–35)
Albumin: 4.1 g/dL (ref 3.6–5.1)
Alkaline phosphatase (APISO): 57 U/L (ref 40–115)
BILIRUBIN TOTAL: 0.4 mg/dL (ref 0.2–1.2)
BUN: 9 mg/dL (ref 7–25)
CHLORIDE: 104 mmol/L (ref 98–110)
CO2: 25 mmol/L (ref 20–32)
CREATININE: 1.03 mg/dL (ref 0.70–1.33)
Calcium: 9.1 mg/dL (ref 8.6–10.3)
GLUCOSE: 99 mg/dL (ref 65–99)
Globulin: 2.2 g/dL (calc) (ref 1.9–3.7)
POTASSIUM: 4.2 mmol/L (ref 3.5–5.3)
SODIUM: 137 mmol/L (ref 135–146)
TOTAL PROTEIN: 6.3 g/dL (ref 6.1–8.1)

## 2017-10-14 LAB — URIC ACID: URIC ACID, SERUM: 8.1 mg/dL — AB (ref 4.0–8.0)

## 2017-10-14 LAB — LIPID PANEL
CHOLESTEROL: 191 mg/dL (ref <200)
HDL: 35 mg/dL — AB (ref >40)
LDL Cholesterol (Calc): 122 mg/dL (calc) — ABNORMAL HIGH
NON-HDL CHOLESTEROL (CALC): 156 mg/dL — AB (ref <130)
Total CHOL/HDL Ratio: 5.5 (calc) — ABNORMAL HIGH (ref <5.0)
Triglycerides: 219 mg/dL — ABNORMAL HIGH (ref <150)

## 2017-10-14 LAB — PSA: PSA: 0.7 ng/mL (ref <=4.0)

## 2017-10-14 LAB — HEMOGLOBIN A1C
EAG (MMOL/L): 6.2 (calc)
Hgb A1c MFr Bld: 5.5 % of total Hgb (ref <5.7)
MEAN PLASMA GLUCOSE: 111 (calc)

## 2017-10-16 ENCOUNTER — Encounter: Payer: Self-pay | Admitting: Family Medicine

## 2017-10-16 ENCOUNTER — Ambulatory Visit (INDEPENDENT_AMBULATORY_CARE_PROVIDER_SITE_OTHER): Payer: Medicare HMO | Admitting: Family Medicine

## 2017-10-16 VITALS — BP 120/80 | HR 86 | Temp 97.8°F | Ht 73.5 in | Wt 242.5 lb

## 2017-10-16 DIAGNOSIS — M1A9XX0 Chronic gout, unspecified, without tophus (tophi): Secondary | ICD-10-CM

## 2017-10-16 DIAGNOSIS — R4589 Other symptoms and signs involving emotional state: Secondary | ICD-10-CM

## 2017-10-16 DIAGNOSIS — F329 Major depressive disorder, single episode, unspecified: Secondary | ICD-10-CM

## 2017-10-16 DIAGNOSIS — I10 Essential (primary) hypertension: Secondary | ICD-10-CM

## 2017-10-16 DIAGNOSIS — F17200 Nicotine dependence, unspecified, uncomplicated: Secondary | ICD-10-CM

## 2017-10-16 DIAGNOSIS — I25708 Atherosclerosis of coronary artery bypass graft(s), unspecified, with other forms of angina pectoris: Secondary | ICD-10-CM

## 2017-10-16 DIAGNOSIS — Z7189 Other specified counseling: Secondary | ICD-10-CM

## 2017-10-16 DIAGNOSIS — Z789 Other specified health status: Secondary | ICD-10-CM

## 2017-10-16 DIAGNOSIS — I208 Other forms of angina pectoris: Secondary | ICD-10-CM

## 2017-10-16 DIAGNOSIS — I255 Ischemic cardiomyopathy: Secondary | ICD-10-CM

## 2017-10-16 DIAGNOSIS — Z0000 Encounter for general adult medical examination without abnormal findings: Secondary | ICD-10-CM

## 2017-10-16 DIAGNOSIS — R7303 Prediabetes: Secondary | ICD-10-CM

## 2017-10-16 DIAGNOSIS — I2089 Other forms of angina pectoris: Secondary | ICD-10-CM

## 2017-10-16 DIAGNOSIS — Z951 Presence of aortocoronary bypass graft: Secondary | ICD-10-CM

## 2017-10-16 DIAGNOSIS — I25119 Atherosclerotic heart disease of native coronary artery with unspecified angina pectoris: Secondary | ICD-10-CM

## 2017-10-16 DIAGNOSIS — E785 Hyperlipidemia, unspecified: Secondary | ICD-10-CM

## 2017-10-16 MED ORDER — ALPRAZOLAM 0.25 MG PO TABS: 0.2500 mg | ORAL_TABLET | Freq: Two times a day (BID) | ORAL | 3 refills | Status: DC | PRN

## 2017-10-16 MED ORDER — COLCRYS 0.6 MG PO TABS: ORAL_TABLET | ORAL | 3 refills | Status: DC

## 2017-10-16 NOTE — Assessment & Plan Note (Signed)
Discussed antidepressant therapy. He declines at this time but agrees to consider this. Will continue xanax PRN.

## 2017-10-16 NOTE — Patient Instructions (Addendum)
Return stool kit.  Keep up the good work with nicotine patches Work on sodas.  Advanced directive packet provided today.  Good to see you today, return as needed or in 6 months for follow up visit.   Health Maintenance, Male A healthy lifestyle and preventive care is important for your health and wellness. Ask your health care provider about what schedule of regular examinations is right for you. What should I know about weight and diet? Eat a Healthy Diet  Eat plenty of vegetables, fruits, whole grains, low-fat dairy products, and lean protein.  Do not eat a lot of foods high in solid fats, added sugars, or salt.  Maintain a Healthy Weight Regular exercise can help you achieve or maintain a healthy weight. You should:  Do at least 150 minutes of exercise each week. The exercise should increase your heart rate and make you sweat (moderate-intensity exercise).  Do strength-training exercises at least twice a week.  Watch Your Levels of Cholesterol and Blood Lipids  Have your blood tested for lipids and cholesterol every 5 years starting at 55 years of age. If you are at high risk for heart disease, you should start having your blood tested when you are 55 years old. You may need to have your cholesterol levels checked more often if: ? Your lipid or cholesterol levels are high. ? You are older than 55 years of age. ? You are at high risk for heart disease.  What should I know about cancer screening? Many types of cancers can be detected early and may often be prevented. Lung Cancer  You should be screened every year for lung cancer if: ? You are a current smoker who has smoked for at least 30 years. ? You are a former smoker who has quit within the past 15 years.  Talk to your health care provider about your screening options, when you should start screening, and how often you should be screened.  Colorectal Cancer  Routine colorectal cancer screening usually begins at 55 years  of age and should be repeated every 5-10 years until you are 55 years old. You may need to be screened more often if early forms of precancerous polyps or small growths are found. Your health care provider may recommend screening at an earlier age if you have risk factors for colon cancer.  Your health care provider may recommend using home test kits to check for hidden blood in the stool.  A small camera at the end of a tube can be used to examine your colon (sigmoidoscopy or colonoscopy). This checks for the earliest forms of colorectal cancer.  Prostate and Testicular Cancer  Depending on your age and overall health, your health care provider may do certain tests to screen for prostate and testicular cancer.  Talk to your health care provider about any symptoms or concerns you have about testicular or prostate cancer.  Skin Cancer  Check your skin from head to toe regularly.  Tell your health care provider about any new moles or changes in moles, especially if: ? There is a change in a mole's size, shape, or color. ? You have a mole that is larger than a pencil eraser.  Always use sunscreen. Apply sunscreen liberally and repeat throughout the day.  Protect yourself by wearing long sleeves, pants, a wide-brimmed hat, and sunglasses when outside.  What should I know about heart disease, diabetes, and high blood pressure?  If you are 64-28 years of age, have your blood  pressure checked every 3-5 years. If you are 67 years of age or older, have your blood pressure checked every year. You should have your blood pressure measured twice-once when you are at a hospital or clinic, and once when you are not at a hospital or clinic. Record the average of the two measurements. To check your blood pressure when you are not at a hospital or clinic, you can use: ? An automated blood pressure machine at a pharmacy. ? A home blood pressure monitor.  Talk to your health care provider about your target  blood pressure.  If you are between 64-35 years old, ask your health care provider if you should take aspirin to prevent heart disease.  Have regular diabetes screenings by checking your fasting blood sugar level. ? If you are at a normal weight and have a low risk for diabetes, have this test once every three years after the age of 37. ? If you are overweight and have a high risk for diabetes, consider being tested at a younger age or more often.  A one-time screening for abdominal aortic aneurysm (AAA) by ultrasound is recommended for men aged 69-75 years who are current or former smokers. What should I know about preventing infection? Hepatitis B If you have a higher risk for hepatitis B, you should be screened for this virus. Talk with your health care provider to find out if you are at risk for hepatitis B infection. Hepatitis C Blood testing is recommended for:  Everyone born from [birth year] through 1964.  Anyone with known risk factors for hepatitis C.  Sexually Transmitted Diseases (STDs)  You should be screened each year for STDs including gonorrhea and chlamydia if: ? You are sexually active and are younger than 55 years of age. ? You are older than 55 years of age and your health care provider tells you that you are at risk for this type of infection. ? Your sexual activity has changed since you were last screened and you are at an increased risk for chlamydia or gonorrhea. Ask your health care provider if you are at risk.  Talk with your health care provider about whether you are at high risk of being infected with HIV. Your health care provider may recommend a prescription medicine to help prevent HIV infection.  What else can I do?  Schedule regular health, dental, and eye exams.  Stay current with your vaccines (immunizations).  Do not use any tobacco products, such as cigarettes, chewing tobacco, and e-cigarettes. If you need help quitting, ask your health care  provider.  Limit alcohol intake to no more than 2 drinks per day. One drink equals 12 ounces of beer, 5 ounces of wine, or 1 ounces of hard liquor.  Do not use street drugs.  Do not share needles.  Ask your health care provider for help if you need support or information about quitting drugs.  Tell your health care provider if you often feel depressed.  Tell your health care provider if you have ever been abused or do not feel safe at home. This information is not intended to replace advice given to you by your health care provider. Make sure you discuss any questions you have with your health care provider. Document Released: 06/12/2008 Document Revised: 08/13/2016 Document Reviewed: 09/18/2015 Elsevier Interactive Patient Education  [employer].

## 2017-10-16 NOTE — Assessment & Plan Note (Signed)
Stable with PRN colchicine. He did not tolerate last month's prednisone course very well.

## 2017-10-16 NOTE — Assessment & Plan Note (Signed)
Chronic, stable. Continue current regimen. 

## 2017-10-16 NOTE — Assessment & Plan Note (Signed)
Tolerating pravastatin QOD dosing better.

## 2017-10-16 NOTE — Assessment & Plan Note (Signed)
Advanced directives: would want wife as HCPOA. Advanced directive packet provided last year and again today.

## 2017-10-16 NOTE — Assessment & Plan Note (Signed)
Preventative protocols reviewed and updated unless pt declined. Discussed healthy diet and lifestyle.  

## 2017-10-16 NOTE — Assessment & Plan Note (Signed)
Continue to encourage cessation. Action phase - using nicotine patch.

## 2017-10-16 NOTE — Progress Notes (Signed)
BP 120/80 (BP Location: Left Arm, Patient Position: Sitting, Cuff Size: Normal)   Pulse 86   Temp 97.8 F (36.6 C) (Oral)   Ht 6' 1.5" (1.867 m)   Wt 242 lb 8 oz (110 kg)   SpO2 98%   BMI 31.56 kg/m    CC: medicare wellness visit Subjective:    Patient ID: Calvin Francis, male    DOB: 30-Nov-1961, 56 y.o.   MRN: [id number]  HPI: Calvin Francis is a 56 y.o. male presenting on 10/16/2017 for Medicare Wellness   Known CAD followed by cardiology - discussing possible heart transplant. Ongoing chest pain, he has started taking xanax for this which helps. Requests refill.  Seen last month with R leg pain - ABIs reassuring. Noted pes planus. ?gout - Rx prednisone course - 40mg  prednisone caused chest pain. Now L toe flaring up "gout".   Did not see Virl Axe this year.    Hearing Screening   125Hz  250Hz  500Hz  1000Hz  2000Hz  3000Hz  4000Hz  6000Hz  8000Hz   Right ear:   40 40 20  40    Left ear:   40 40 20  20      Visual Acuity Screening   Right eye Left eye Both eyes  Without correction: 20/20 20/20 20/20   With correction:     No falls in the past year Denies depression/anhedonia  Preventative: Colon cancer screening - discussed, requests iFOB. Did not turn in iFOB last few year.  Prostate cancer screening - normal 10/2014. PSA normal. DRE Q few years.  Flu shot yearly Tdap done - 11/2012 Advanced directives: would want wife as HCPOA. Advanced directive packet provided last year and again today.  Seat belt use discussed. Sunscreen use discussed. No changing moles on skin.  Continued smoker - was 1/2 ppd. Now on nicotine patch. Did not tolerate chantix.  Alcohol - occasional   Caffeine: 1 cup coffee in am  Lives with wife, 2 dogs  Grown children, 3 grandchildren  Occupation: disability from cardiac status, prior worked for [employer]  Activity: walks driveway, limited by chest pain/SOB  Diet: some water, fruits/vegetables occasionally, soft drinks  Relevant  past medical, surgical, family and social history reviewed and updated as indicated. Interim medical history since our last visit reviewed. Allergies and medications reviewed and updated. Outpatient Medications Prior to Visit  Medication Sig Dispense Refill  . albuterol (PROVENTIL HFA;VENTOLIN HFA) 108 (90 BASE) MCG/ACT inhaler Inhale 2 puffs into the lungs every 4 (four) hours as needed for wheezing or shortness of breath. 1 Inhaler 0  . APPLE CIDER VINEGAR PO Take by mouth.    . bisoprolol (ZEBETA) 5 MG tablet Take 0.5 tablets (2.5 mg total) by mouth daily. 30 tablet 6  . co-enzyme Q-10 30 MG capsule Take 1 capsule (30 mg total) by mouth daily.    . fluorouracil (EFUDEX) 5 % cream Apply topically 2 (two) times daily. 40 g 1  . fluticasone (FLONASE) 50 MCG/ACT nasal spray Place 2 sprays into both nostrils daily. 16 g 1  . nitroGLYCERIN (NITROSTAT) 0.4 MG SL tablet Place 1 tablet (0.4 mg total) under the tongue every 5 (five) minutes as needed for chest pain. 25 tablet 0  . omeprazole (PRILOSEC OTC) 20 MG tablet Take 20 mg by mouth daily.    . prasugrel (EFFIENT) 10 MG TABS tablet TAKE 1 TABLET EVERY DAY 90 tablet 3  . pravastatin (PRAVACHOL) 40 MG tablet Take 1 tablet (40 mg total) by mouth daily. (Patient taking differently:  Take 40 mg by mouth every other day. Pt states he doesn't take every day) 90 tablet 3  . ranolazine (RANEXA) 1000 MG SR tablet Take 1 tablet (1,000 mg total) by mouth twice daily, as directed. 180 tablet 3  . triamcinolone cream (KENALOG) 0.1 % Apply 1 application topically 2 (two) times daily as needed.    . TURMERIC PO Take 2 capsules by mouth daily.    . vitamin B-12 (CYANOCOBALAMIN) 500 MCG tablet Take 500 mcg by mouth daily.    Marland Kitchen. ALPRAZolam (XANAX) 0.25 MG tablet Take 1 tablet (0.25 mg total) by mouth daily as needed for anxiety. 30 tablet 0  . COLCRYS 0.6 MG tablet TAKE AS DIRECTED 2 TABLETS BY MOUTH ON FIRST DAY THEN ONE DAILY AS NEEDED 30 tablet 0  . predniSONE  (DELTASONE) 20 MG tablet Take two tablets daily for 3 days followed by one tablet daily for 4 days 10 tablet 0  . lisinopril (PRINIVIL,ZESTRIL) 2.5 MG tablet Take 1 tablet (2.5 mg total) by mouth daily. 90 tablet 3   No facility-administered medications prior to visit.      Per HPI unless specifically indicated in ROS section below Review of Systems  Constitutional: Negative for activity change, appetite change, chills, fatigue, fever and unexpected weight change.  HENT: Negative for hearing loss.   Eyes: Negative for visual disturbance.  Respiratory: Positive for chest tightness and shortness of breath. Negative for cough and wheezing.   Cardiovascular: Positive for chest pain. Negative for palpitations and leg swelling.  Gastrointestinal: Negative for abdominal distention, abdominal pain, blood in stool, constipation, diarrhea, nausea and vomiting.  Genitourinary: Negative for difficulty urinating and hematuria.  Musculoskeletal: Negative for arthralgias, myalgias and neck pain.  Skin: Negative for rash.  Neurological: Negative for dizziness, seizures, syncope and headaches.  Hematological: Negative for adenopathy. Does not bruise/bleed easily.  Psychiatric/Behavioral: Negative for dysphoric mood. The patient is not nervous/anxious.        Objective:    BP 120/80 (BP Location: Left Arm, Patient Position: Sitting, Cuff Size: Normal)   Pulse 86   Temp 97.8 F (36.6 C) (Oral)   Ht 6' 1.5" (1.867 m)   Wt 242 lb 8 oz (110 kg)   SpO2 98%   BMI 31.56 kg/m   Wt Readings from Last 3 Encounters:  10/16/17 242 lb 8 oz (110 kg)  09/17/17 244 lb (110.7 kg)  09/03/17 249 lb (112.9 kg)    Physical Exam  Constitutional: He is oriented to person, place, and time. He appears well-developed and well-nourished. No distress.  HENT:  Head: Normocephalic and atraumatic.  Right Ear: Hearing, tympanic membrane, external ear and ear canal normal.  Left Ear: Hearing, tympanic membrane, external  ear and ear canal normal.  Nose: Nose normal.  Mouth/Throat: Uvula is midline, oropharynx is clear and moist and mucous membranes are normal. No oropharyngeal exudate, posterior oropharyngeal edema or posterior oropharyngeal erythema.  Eyes: Pupils are equal, round, and reactive to light. Conjunctivae and EOM are normal. No scleral icterus.  Neck: Normal range of motion. Neck supple. Carotid bruit is not present. No thyromegaly present.  Cardiovascular: Normal rate, regular rhythm, normal heart sounds and intact distal pulses.   No murmur heard. Pulses:      Radial pulses are 2+ on the right side, and 2+ on the left side.  Pulmonary/Chest: Effort normal and breath sounds normal. No respiratory distress. He has no wheezes. He has no rales.  Abdominal: Soft. Bowel sounds are normal. He exhibits no  distension and no mass. There is no tenderness. There is no rebound and no guarding.  Musculoskeletal: Normal range of motion. He exhibits no edema.  Lymphadenopathy:    He has no cervical adenopathy.  Neurological: He is alert and oriented to person, place, and time.  CN grossly intact, station and gait intact  Skin: Skin is warm and dry. No rash noted.  Psychiatric: He has a normal mood and affect. His behavior is normal. Judgment and thought content normal.  Nursing note and vitals reviewed.  Results for orders placed or performed in visit on 10/13/17  Hemoglobin A1c  Result Value Ref Range   Hgb A1c MFr Bld 5.5 <5.7 % of total Hgb   Mean Plasma Glucose 111 (calc)   eAG (mmol/L) 6.2 (calc)  Uric acid  Result Value Ref Range   Uric Acid, Serum 8.1 (H) 4.0 - 8.0 mg/dL  Comprehensive metabolic panel  Result Value Ref Range   Glucose, Bld 99 65 - 99 mg/dL   BUN 9 7 - 25 mg/dL   Creat 4.09 8.11 - 9.14 mg/dL   BUN/Creatinine Ratio NOT APPLICABLE 6 - 22 (calc)   Sodium 137 135 - 146 mmol/L   Potassium 4.2 3.5 - 5.3 mmol/L   Chloride 104 98 - 110 mmol/L   CO2 25 20 - 32 mmol/L   Calcium 9.1  8.6 - 10.3 mg/dL   Total Protein 6.3 6.1 - 8.1 g/dL   Albumin 4.1 3.6 - 5.1 g/dL   Globulin 2.2 1.9 - 3.7 g/dL (calc)   AG Ratio 1.9 1.0 - 2.5 (calc)   Total Bilirubin 0.4 0.2 - 1.2 mg/dL   Alkaline phosphatase (APISO) 57 40 - 115 U/L   AST 17 10 - 35 U/L   ALT 15 9 - 46 U/L  Lipid panel  Result Value Ref Range   Cholesterol 191 <200 mg/dL   HDL 35 (L) >78 mg/dL   Triglycerides 295 (H) <150 mg/dL   LDL Cholesterol (Calc) 122 (H) mg/dL (calc)   Total CHOL/HDL Ratio 5.5 (H) <5.0 (calc)   Non-HDL Cholesterol (Calc) 156 (H) <130 mg/dL (calc)  PSA  Result Value Ref Range   PSA 0.7 < OR = 4.0 ng/mL      Assessment & Plan:   Problem List Items Addressed This Visit    Advanced care planning/counseling discussion    Advanced directives: would want wife as HCPOA. Advanced directive packet provided last year and again today.       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 He declines at this time but agrees to consider this. Will continue xanax PRN.       Essential hypertension (Chronic)    Chronic, stable. Continue current regimen.       Gout    Stable with PRN colchicine. He did not tolerate last month's prednisone course very well.       Health maintenance examination    Preventative protocols reviewed and updated unless pt declined. Discussed healthy diet and lifestyle.       Hyperlipidemia with target LDL less than 70 with statin related symptoms (Chronic)    Tolerating pravastatin 40mg  QOD. Does not tolerate more potent statin, unable to afford repatha. Declines stronger statin.       Ischemic cardiomyopathy (Chronic)   Medicare annual wellness visit, subsequent - Primary    I  have personally reviewed the Medicare Annual Wellness questionnaire and have noted 1. The patient's medical and social  history 2. Their use of alcohol, tobacco or illicit drugs 3. Their current medications and supplements 4. The patient's functional ability including ADL's, fall risks, home safety risks and hearing or visual impairment. Cognitive function has been assessed and addressed as indicated.  5. Diet and physical activity 6. Evidence for depression or mood disorders The patients weight, height, BMI have been recorded in the chart. I have made referrals, counseling and provided education to the patient based on review of the above and I have provided the pt with a written personalized care plan for preventive services. Provider list updated.. See scanned questionairre as needed for further documentation. Reviewed preventative protocols and updated unless pt declined.       Prediabetes (Chronic)    Levels have normalized.       S/P CABG x 4   Severe obesity (BMI 35.0-39.9) with comorbidity (HCC)    Noted weight loss - discussed healthy diet and lifestyle changes to affect sustainable weight loss. Activity limited by chest pain. Encouraged ongoing efforts.       Smoker    Continue to encourage cessation. Action phase - using nicotine patch.       Stable angina: Class I-II (Chronic)   Statin intolerance (Chronic)    Tolerating pravastatin QOD dosing better.           Follow up plan: Return in about 6 months (around 04/16/2018) for follow up visit.  Eustaquio Boyden, MD

## 2017-10-16 NOTE — Assessment & Plan Note (Signed)
Noted weight loss - discussed healthy diet and lifestyle changes to affect sustainable weight loss. Activity limited by chest pain. Encouraged ongoing efforts.

## 2017-10-16 NOTE — Assessment & Plan Note (Signed)
Tolerating pravastatin 40mg  QOD. Does not tolerate more potent statin, unable to afford repatha. Declines stronger statin.

## 2017-10-16 NOTE — Assessment & Plan Note (Signed)

## 2017-10-16 NOTE — Assessment & Plan Note (Signed)
Levels have normalized.  

## 2017-11-23 ENCOUNTER — Ambulatory Visit: Payer: Medicare HMO | Admitting: Family Medicine

## 2017-11-23 ENCOUNTER — Ambulatory Visit (INDEPENDENT_AMBULATORY_CARE_PROVIDER_SITE_OTHER)
Admission: RE | Admit: 2017-11-23 | Discharge: 2017-11-23 | Disposition: A | Discharge status: Home / Self Care | Payer: Medicare HMO | Source: Ambulatory Visit | Attending: Family Medicine | Admitting: Family Medicine

## 2017-11-23 ENCOUNTER — Encounter: Payer: Self-pay | Admitting: Family Medicine

## 2017-11-23 VITALS — BP 120/76 | HR 85 | Temp 97.9°F | Wt 244.0 lb

## 2017-11-23 DIAGNOSIS — M25571 Pain in right ankle and joints of right foot: Secondary | ICD-10-CM | POA: Diagnosis not present

## 2017-11-23 MED ORDER — DICLOFENAC SODIUM 1 % TD GEL: 1.0000 "application " | Freq: Three times a day (TID) | TRANSDERMAL | 1 refills | Status: DC

## 2017-11-23 NOTE — Progress Notes (Signed)
BP 120/76 (BP Location: Left Arm, Patient Position: Sitting, Cuff Size: Normal)   Pulse 85   Temp 97.9 F (36.6 C) (Oral)   Wt 244 lb (110.7 kg)   SpO2 97%   BMI 31.76 kg/m    CC: R ankle pain Subjective:    Patient ID: Calvin Francis, male    DOB: 27-Apr-1961, 56 y.o.   MRN: [id number]  HPI: Calvin Francis is a 56 y.o. male presenting on 11/23/2017 for Ankle Pain (right, ongoing. Worse when lying down and after walking for a long period of time)   [facility] step-daughter involved in MVA with difficult course. Just extubated yesterday. Increased walking with this.   Ongoing R foot pain ongoing previously attributed to gout. Treating with colchicine or aleve PRN without much improvement. Did not tolerate prednisone 40mg  dose very well. Also with posterior calf pain after prolonged walking especially going up steps. He's tried ankle brace without help. He did not buy arch support insole. Denies inciting trauma or injury. Never redness, swelling or warmth. Hot shower helps.  ABIs normal 08/2017.  Lab Results  Component Value Date   LABURIC 8.1 (H) 10/13/2017     Discussing possible heart transplant for severe CAD s/p prior interventions.  Relevant past medical, surgical, family and social history reviewed and updated as indicated. Interim medical history since our last visit reviewed. Allergies and medications reviewed and updated. Outpatient Medications Prior to Visit  Medication Sig Dispense Refill  . albuterol (PROVENTIL HFA;VENTOLIN HFA) 108 (90 BASE) MCG/ACT inhaler Inhale 2 puffs into the lungs every 4 (four) hours as needed for wheezing or shortness of breath. 1 Inhaler 0  . ALPRAZolam (XANAX) 0.25 MG tablet Take 1 tablet (0.25 mg total) by mouth 2 (two) times daily as needed for anxiety. 60 tablet 3  . APPLE CIDER VINEGAR PO Take by mouth.    . bisoprolol (ZEBETA) 5 MG tablet Take 0.5 tablets (2.5 mg total) by mouth daily. 30 tablet 6  .  co-enzyme Q-10 30 MG capsule Take 1 capsule (30 mg total) by mouth daily.    Marland Kitchen. COLCRYS 0.6 MG tablet TAKE AS DIRECTED 2 TABLETS BY MOUTH ON FIRST DAY THEN ONE DAILY AS NEEDED 30 tablet 3  . fluorouracil (EFUDEX) 5 % cream Apply topically 2 (two) times daily. 40 g 1  . fluticasone (FLONASE) 50 MCG/ACT nasal spray Place 2 sprays into both nostrils daily. 16 g 1  . nitroGLYCERIN (NITROSTAT) 0.4 MG SL tablet Place 1 tablet (0.4 mg total) under the tongue every 5 (five) minutes as needed for chest pain. 25 tablet 0  . omeprazole (PRILOSEC OTC) 20 MG tablet Take 20 mg by mouth daily.    . prasugrel (EFFIENT) 10 MG TABS tablet TAKE 1 TABLET EVERY DAY 90 tablet 3  . pravastatin (PRAVACHOL) 40 MG tablet Take 1 tablet (40 mg total) by mouth daily. (Patient taking differently: Take 40 mg by mouth every other day. Pt states he doesn't take every day) 90 tablet 3  . ranolazine (RANEXA) 1000 MG SR tablet Take 1 tablet (1,000 mg total) by mouth twice daily, as directed. 180 tablet 3  . triamcinolone cream (KENALOG) 0.1 % Apply 1 application topically 2 (two) times daily as needed.    . TURMERIC PO Take 2 capsules by mouth daily.    . vitamin B-12 (CYANOCOBALAMIN) 500 MCG tablet Take 500 mcg by mouth daily.    Marland Kitchen. lisinopril (PRINIVIL,ZESTRIL) 2.5 MG tablet Take 1 tablet (  2.5 mg total) by mouth daily. 90 tablet 3   No facility-administered medications prior to visit.      Per HPI unless specifically indicated in ROS section below Review of Systems     Objective:    BP 120/76 (BP Location: Left Arm, Patient Position: Sitting, Cuff Size: Normal)   Pulse 85   Temp 97.9 F (36.6 C) (Oral)   Wt 244 lb (110.7 kg)   SpO2 97%   BMI 31.76 kg/m   Wt Readings from Last 3 Encounters:  11/23/17 244 lb (110.7 kg)  10/16/17 242 lb 8 oz (110 kg)  09/17/17 244 lb (110.7 kg)    Physical Exam  Constitutional: He is oriented to person, place, and time. He appears well-developed and well-nourished. No distress.    Musculoskeletal: He exhibits no edema.  Diminished pulses bilateral feet Marked loss of longitudinal arch R>L No reproducible pain to palpation of lateral ankle ligament or peroneal tendon No significant pain with calc squeeze test No pain or cords along calves  Neurological: He is alert and oriented to person, place, and time.  Skin: Skin is warm and dry. No rash noted. No erythema.  Nursing note and vitals reviewed.      Assessment & Plan:   Problem List Items Addressed This Visit    Pain in lateral portion of right ankle - Primary    Anticipate peroneal tendonitis or developing arthritis in h/o faulty foot mechanics with marked pes planus and collapse of longitudinal arch. Worsened after recent increased walking. Encouraged rest, ankle brace for support, Rx voltaren topically.  I also recommended eval by sports med to discuss possible custom orthotics. He has used these in the past.  Not consistent with gout flare - no redness, swelling, warmth or pain to palpation appreciated.  Recent ABIs normal.  Caution with NSAIDs and oral steroid in extensive CAD hx.      Relevant Orders   DG Ankle Complete Right       Follow up plan: Return if symptoms worsen or fail to improve.  Calvin BoydenJavier Venus Gilles, MD

## 2017-11-23 NOTE — Patient Instructions (Addendum)
I wonder if this is peroneal tendonitis - treat with voltaren gel topically. We will refer you to sports medicine as well.  Xray today.   Peroneal Tendinopathy Peroneal tendinopathy is irritation of the tendons that pass behind your ankle (peroneal tendons). These tendons attach muscles in your foot to a bone on the side of your foot and underneath the arch of your foot. This condition can cause your peroneal tendons to get bigger and swell. What are the causes? This condition may be caused by:  Putting stress on your ankle over and over again (overuse injury).  A sudden injury that puts stress on your tendons, such as an ankle sprain.  What increases the risk? This condition is more likely to develop in:  People who have high arches.  Athletes who play sports that involve putting stress on the ankle over and over again. These sports include: ? Running. ? Dancing. ? Soccer. ? Basketball.  What are the signs or symptoms? Symptoms of this condition can start suddenly or develop gradually. Symptoms include:  Pain in the back of the ankle, on the side of the foot, or in the arch of the foot.  Pain that gets worse with activity and better with rest.  Swelling.  Warmth.  Weakness in your foot or ankle.  How is this diagnosed? This condition may be diagnosed based on:  Your symptoms.  Your medical history.  A physical exam.  Imaging tests, such as: ? An X-ray or CT scan to check for bone injury. ? MRI or ultrasound to check for muscle or tendon injury.  During your physical exam, your health care provider may move your foot and ankle and test the strength of your leg muscles. How is this treated? This condition may be treated by:  Keeping your body weight off your ankle for several days.  Returning gradually to full activity gradually.  Putting ice on your ankle to reduce swelling.  Taking an anti-inflammatory pain medicine (NSAID).  Having medicine injected  into your tendon to reduce swelling.  Wearing a removable boot or brace for ankle support.  Doing range-of-motion exercises and strengthening exercises (physical therapy) when pain and swelling improve.  If the condition does not improve with treatment, or if a tendon or muscle is damaged, surgery may be needed. Follow these instructions at home: If you have a boot or brace:  Wear it as told by your health care provider. Remove it only as told by your health care provider.  Loosen it if your toes tingle, become numb, or turn cold and blue.  Do not let it get wet if it is not waterproof.  Keep it clean. Managing pain, stiffness, and swelling  If directed, apply ice to the injured area: ? Put ice in a plastic bag. ? Place a towel between your skin and the bag. ? Leave the ice on for 20 minutes, 2-3 times a day.  Take over-the-counter and prescription medicines only as told by your health care provider.  Raise (elevate) your ankle above the level of your heart when resting if you have swelling. Activity  Do not use your ankle to support (bear) your full body weight until your health care provider says that you can.  Do not do activities that make pain or swelling worse.  Return to your normal activities as told by your health care provider. General instructions  Keep all follow-up visits as told by your health care provider. This is important. How is this prevented?  Wear supportive footwear that is appropriate for your athletic activity.  Avoid athletic activities that cause swelling or pain in your ankle or foot.  See your health care provider if you have pain or swelling that does not improve after a few days of rest.  Stop training if you develop pain or swelling.  If you start a new athletic activity, start gradually to build up your strength, endurance, and flexibility. Contact a health care provider if:  Your symptoms get worse.  Your symptoms do not improve in  2-4 weeks.  You develop new, unexplained symptoms. This information is not intended to replace advice given to you by your health care provider. Make sure you discuss any questions you have with your health care provider. Document Released: 12/15/2005 Document Revised: 08/19/2016 Document Reviewed: 11/03/2015 Elsevier Interactive Patient Education  [employer]2018 [employer]

## 2017-11-23 NOTE — Assessment & Plan Note (Addendum)
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 Worsened after recent increased walking. Encouraged rest, ankle brace for support, Rx voltaren topically.  I also recommended eval by sports med to discuss possible custom orthotics. He has used these in the past.  Not consistent with gout flare - no redness, swelling, warmth or pain to palpation appreciated.  Recent ABIs normal.  Caution with NSAIDs and oral steroid in extensive CAD hx.

## 2017-11-27 ENCOUNTER — Other Ambulatory Visit: Payer: Self-pay | Admitting: Family Medicine

## 2017-11-27 ENCOUNTER — Telehealth: Payer: Self-pay

## 2017-11-27 DIAGNOSIS — M25571 Pain in right ankle and joints of right foot: Secondary | ICD-10-CM

## 2017-11-27 NOTE — Telephone Encounter (Signed)
Received faxed PA request from [facility]- [address] for Diclofenac 1% gel.  Started PA, keyDenzil Hughes:  [id number], PA case ID:  [id number]. Decision pending.Marland Kitchen..Marland Kitchen

## 2017-11-30 NOTE — Telephone Encounter (Signed)
[employer] leaves a message from [place]/[employer]. Requesting PA response.   Phone:  [phone] fax: [phone].    Please see below, PA was addressed on 11/27/17.  Thanks.

## 2017-12-01 MED ORDER — DICLOFENAC SODIUM 1 % TD GEL: 2.0000 g | Freq: Three times a day (TID) | TRANSDERMAL | 1 refills | Status: DC

## 2017-12-01 NOTE — Telephone Encounter (Signed)
[facility].  Suggests pt try preferred  brand Voltaren topical gel.

## 2017-12-01 NOTE — Addendum Note (Signed)
Addended by: Eustaquio BoydenGUTIERREZ, Roe Wilner on: 12/01/2017 01:58 PM   Modules accepted: Orders

## 2017-12-01 NOTE — Telephone Encounter (Signed)
Left message on vm to call back.   Need to relay Dr. G's message.  

## 2017-12-01 NOTE — Telephone Encounter (Signed)
plz notify pt that insurance did not cover generic voltaren so i've sent in brand voltaren gel (which is their recommendation).

## 2017-12-17 ENCOUNTER — Ambulatory Visit: Payer: Medicare HMO | Admitting: Family Medicine

## 2017-12-21 ENCOUNTER — Ambulatory Visit: Payer: Medicare HMO | Admitting: Family Medicine

## 2017-12-21 ENCOUNTER — Encounter: Payer: Self-pay | Admitting: Family Medicine

## 2017-12-21 VITALS — BP 120/66 | HR 96 | Temp 98.2°F | Wt 243.0 lb

## 2017-12-21 DIAGNOSIS — I255 Ischemic cardiomyopathy: Secondary | ICD-10-CM

## 2017-12-21 DIAGNOSIS — I208 Other forms of angina pectoris: Secondary | ICD-10-CM | POA: Diagnosis not present

## 2017-12-21 DIAGNOSIS — F17200 Nicotine dependence, unspecified, uncomplicated: Secondary | ICD-10-CM | POA: Diagnosis not present

## 2017-12-21 DIAGNOSIS — J069 Acute upper respiratory infection, unspecified: Secondary | ICD-10-CM

## 2017-12-21 MED ORDER — HYDROCODONE-HOMATROPINE 5-1.5 MG/5ML PO SYRP: 5.0000 mL | ORAL_SOLUTION | Freq: Two times a day (BID) | ORAL | 0 refills | Status: DC | PRN

## 2017-12-21 MED ORDER — AZITHROMYCIN 250 MG PO TABS: ORAL_TABLET | ORAL | 0 refills | Status: DC

## 2017-12-21 NOTE — Assessment & Plan Note (Signed)
Continue to encourage cessation - using nicotine patch.

## 2017-12-21 NOTE — Progress Notes (Signed)
BP 120/66 (BP Location: Left Arm, Patient Position: Sitting, Cuff Size: Normal)   Pulse 96   Temp 98.2 F (36.8 C) (Oral)   Wt 243 lb (110.2 kg)   SpO2 96%   BMI 31.63 kg/m    CC: URI sxs Subjective:    Patient ID: Calvin Francis, male    DOB: 04-06-1961, 56 y.o.   MRN: [id number]  HPI: Calvin Francis is a 56 y.o. male presenting on 12/21/2017 for Cough (productive, worse at night. Started 12/17/17. Taking Dayquil and Mucinex, barely helpul. H/o bronchitis, usually around this time of yr); Sore Throat; and Generalized Body Aches (Taking Tylenol and ibuprofen, helpful)   5d h/o sore throat, body aches, productive cough affecting ability to sleep. Left earache and muffled hearing. Mild dull headache. Progressive onset of symptoms.   No fevers/chills.  Tends to get bronchitis this time of year.  No sick contacts at home.  Tylenol, ibuprofen, dayquil, mucinex temporarily helpful.  Working on smoking cessation - using nicotine patch   Relevant past medical, surgical, family and social history reviewed and updated as indicated. Interim medical history since our last visit reviewed. Allergies and medications reviewed and updated. Outpatient Medications Prior to Visit  Medication Sig Dispense Refill  . albuterol (PROVENTIL HFA;VENTOLIN HFA) 108 (90 BASE) MCG/ACT inhaler Inhale 2 puffs into the lungs every 4 (four) hours as needed for wheezing or shortness of breath. 1 Inhaler 0  . ALPRAZolam (XANAX) 0.25 MG tablet Take 1 tablet (0.25 mg total) by mouth 2 (two) times daily as needed for anxiety. 60 tablet 3  . APPLE CIDER VINEGAR PO Take by mouth.    . bisoprolol (ZEBETA) 5 MG tablet Take 0.5 tablets (2.5 mg total) by mouth daily. 30 tablet 6  . co-enzyme Q-10 30 MG capsule Take 1 capsule (30 mg total) by mouth daily.    Marland Kitchen. COLCRYS 0.6 MG tablet TAKE AS DIRECTED 2 TABLETS BY MOUTH ON FIRST DAY THEN ONE DAILY AS NEEDED 30 tablet 3  . diclofenac sodium (VOLTAREN) 1 % GEL Apply 2 g topically  3 (three) times daily. 100 g 1  . fluorouracil (EFUDEX) 5 % cream Apply topically 2 (two) times daily. 40 g 1  . fluticasone (FLONASE) 50 MCG/ACT nasal spray Place 2 sprays into both nostrils daily. 16 g 1  . nitroGLYCERIN (NITROSTAT) 0.4 MG SL tablet Place 1 tablet (0.4 mg total) under the tongue every 5 (five) minutes as needed for chest pain. 25 tablet 0  . omeprazole (PRILOSEC OTC) 20 MG tablet Take 20 mg by mouth daily.    . prasugrel (EFFIENT) 10 MG TABS tablet TAKE 1 TABLET EVERY DAY 90 tablet 3  . pravastatin (PRAVACHOL) 40 MG tablet Take 1 tablet (40 mg total) by mouth daily. (Patient taking differently: Take 40 mg by mouth every other day. Pt states he doesn't take every day) 90 tablet 3  . ranolazine (RANEXA) 1000 MG SR tablet Take 1 tablet (1,000 mg total) by mouth twice daily, as directed. 180 tablet 3  . triamcinolone cream (KENALOG) 0.1 % Apply 1 application topically 2 (two) times daily as needed.    . TURMERIC PO Take 2 capsules by mouth daily.    . vitamin B-12 (CYANOCOBALAMIN) 500 MCG tablet Take 500 mcg by mouth daily.    Marland Kitchen. lisinopril (PRINIVIL,ZESTRIL) 2.5 MG tablet Take 1 tablet (2.5 mg total) by mouth daily. 90 tablet 3   No facility-administered medications prior to visit.      Per HPI  unless specifically indicated in ROS section below Review of Systems     Objective:    BP 120/66 (BP Location: Left Arm, Patient Position: Sitting, Cuff Size: Normal)   Pulse 96   Temp 98.2 F (36.8 C) (Oral)   Wt 243 lb (110.2 kg)   SpO2 96%   BMI 31.63 kg/m   Wt Readings from Last 3 Encounters:  12/21/17 243 lb (110.2 kg)  11/23/17 244 lb (110.7 kg)  10/16/17 242 lb 8 oz (110 kg)    Physical Exam  Constitutional: He appears well-developed and well-nourished. No distress.  HENT:  Head: Normocephalic and atraumatic.  Right Ear: Hearing, tympanic membrane, external ear and ear canal normal.  Left Ear: Hearing, tympanic membrane, external ear and ear canal normal.  Nose:  Mucosal edema present. No rhinorrhea. Right sinus exhibits no maxillary sinus tenderness and no frontal sinus tenderness. Left sinus exhibits no maxillary sinus tenderness and no frontal sinus tenderness.  Mouth/Throat: Uvula is midline, oropharynx is clear and moist and mucous membranes are normal. No oropharyngeal exudate, posterior oropharyngeal edema, posterior oropharyngeal erythema or tonsillar abscesses.  Congestion behind TMs  Eyes: Conjunctivae and EOM are normal. Pupils are equal, round, and reactive to light. No scleral icterus.  Neck: Normal range of motion. Neck supple.  Cardiovascular: Normal rate, regular rhythm, normal heart sounds and intact distal pulses.  No murmur heard. Pulmonary/Chest: Effort normal and breath sounds normal. No respiratory distress. He has no wheezes. He has no rales.  Lymphadenopathy:    He has no cervical adenopathy.  Skin: Skin is warm and dry. No rash noted.  Nursing note and vitals reviewed.     Assessment & Plan:   Problem List Items Addressed This Visit    Ischemic cardiomyopathy (Chronic)   Smoker    Continue to encourage cessation - using nicotine patch.      Stable angina: Class I-II (Chronic)   Upper respiratory infection - Primary    Anticipate viral given short duration. Supportive care as per instructions.  Hycodan for cough. WASP for zpack provided with indications when to fill.  Pt agrees with plan.       Relevant Medications   azithromycin (ZITHROMAX) 250 MG tablet       Follow up plan: Return if symptoms worsen or fail to improve.  Eustaquio BoydenJavier Elchonon Maxson, MD

## 2017-12-21 NOTE — Patient Instructions (Addendum)
You have a upper respiratory infection.  Take cough syrup at night time.  Use medication as prescribed: zpack antibiotic to fill if not improving over next few days.  Push fluids and plenty of rest.  May continue mucinex and tyelnol.  Please return if you are not improving as expected, or if you have high fevers (>101.5) or difficulty swallowing or worsening productive cough. Call clinic with questions.  Good to see you today. I hope you start feeling better soon.

## 2017-12-21 NOTE — Assessment & Plan Note (Signed)
Anticipate viral given short duration. Supportive care as per instructions.  Hycodan for cough. WASP for zpack provided with indications when to fill.  Pt agrees with plan.

## 2017-12-24 ENCOUNTER — Other Ambulatory Visit: Payer: Self-pay | Admitting: Family Medicine

## 2017-12-24 MED ORDER — HYDROCODONE-HOMATROPINE 5-1.5 MG/5ML PO SYRP: 5.0000 mL | ORAL_SOLUTION | Freq: Four times a day (QID) | ORAL | 0 refills | Status: DC | PRN

## 2017-12-24 NOTE — Telephone Encounter (Signed)
Copied from CRM [phone]. Topic: Quick Communication - See Telephone Encounter >> Dec 24, 2017 10:55 AM Jolayne Hainesaylor, Brittany L wrote: CRM for notification. See Telephone encounter for:   12/24/17.  Pt came in the other day and Dr. Reece AgarG gave him a zpack & a cough syrup. He has one more day of zpack & has no more cough medicine. He said he is not feeling any better. He said the script was suppose for 14 days and the pharmacy filled it for four days. He said he has been taking tylenol and cough drops. Please call him back because he is said he needs something else, call back is [phone].

## 2017-12-24 NOTE — Telephone Encounter (Signed)
Pt. Is requesting a refill of his Cough syrup. States the pharmacy would only dispense four days worth. Please advise pt.

## 2017-12-24 NOTE — Telephone Encounter (Signed)
plz notify hycodan was again sent in.

## 2017-12-24 NOTE — Telephone Encounter (Signed)
Spoke with pt notifying him rx was sent to pharmacy.  Expresses his thanks.

## 2017-12-27 ENCOUNTER — Other Ambulatory Visit: Payer: Self-pay

## 2017-12-27 ENCOUNTER — Emergency Department (HOSPITAL_COMMUNITY): Payer: Medicare HMO

## 2017-12-27 ENCOUNTER — Encounter (HOSPITAL_COMMUNITY): Payer: Self-pay | Admitting: Emergency Medicine

## 2017-12-27 ENCOUNTER — Emergency Department (HOSPITAL_COMMUNITY)
Admission: EM | Admit: 2017-12-27 | Discharge: 2017-12-27 | Disposition: A | Discharge status: Home / Self Care | Payer: Medicare HMO | Attending: Physician Assistant | Admitting: Physician Assistant

## 2017-12-27 DIAGNOSIS — Z951 Presence of aortocoronary bypass graft: Secondary | ICD-10-CM | POA: Diagnosis not present

## 2017-12-27 DIAGNOSIS — I10 Essential (primary) hypertension: Secondary | ICD-10-CM | POA: Diagnosis not present

## 2017-12-27 DIAGNOSIS — R05 Cough: Secondary | ICD-10-CM | POA: Insufficient documentation

## 2017-12-27 DIAGNOSIS — Z79899 Other long term (current) drug therapy: Secondary | ICD-10-CM | POA: Diagnosis not present

## 2017-12-27 DIAGNOSIS — I2510 Atherosclerotic heart disease of native coronary artery without angina pectoris: Secondary | ICD-10-CM | POA: Diagnosis not present

## 2017-12-27 DIAGNOSIS — Z87891 Personal history of nicotine dependence: Secondary | ICD-10-CM | POA: Diagnosis not present

## 2017-12-27 DIAGNOSIS — I252 Old myocardial infarction: Secondary | ICD-10-CM | POA: Diagnosis not present

## 2017-12-27 DIAGNOSIS — R059 Cough, unspecified: Secondary | ICD-10-CM

## 2017-12-27 MED ORDER — BENZONATATE 100 MG PO CAPS: 100.0000 mg | ORAL_CAPSULE | Freq: Three times a day (TID) | ORAL | 0 refills | Status: DC | PRN

## 2017-12-27 NOTE — ED Provider Notes (Signed)
[name] Provider Note   CSN: [id number] Arrival date & time: 12/27/17  1051     History   Chief Complaint Chief Complaint  Patient presents with  . Cough    HPI Calvin Francis is a 56 y.o. male with a history of hypertension, CAD, and former tobacco use presented to the ED complaining of cough for the past 10 days.  Describes cough as dry, having associated nasal congestion.  States when he is coughing he is having some chest discomfort and difficulty breathing, otherwise neither of these symptoms are occurring.  He has had temperature of 99.8.  Patient was given a Z-Pak  which he completed 2 days ago without improvement.  Has tried codeine cough syrup, DayQuil, Mucinex, Tylenol, and ibuprofen all without relief.  Denies ear pain, sore throat, or abdominal pain.   HPI  Past Medical History:  Diagnosis Date  . 3-vessel CAD 02/22/2004   mid RCA 100% occluded, L-R collaterals; LAD 60-70% bifurcation lesion; Cx proximal 70% and mid 80% after OM1, OM1 100% occluded. --> CABG x 4  . Barrett's esophagus    s/p EGD several years ago with   . Bronchitis, mucopurulent recurrent ([place])   . CAD (coronary artery disease) of bypass graft 5/'12; 12/'13   Cath for angina and Abn Myoview ST: SVG-OM1 now occluded, attempt at PCI to the native circumflex OM1 unsuccessful; distal LAD beyond patent LIMA 70-80% (not PCI amenable); free radial-OM 2 patent; EF 45-50%  . Erectile dysfunction   . Former heavy cigarette smoker (20-39 per day) May 2013   . GERD (gastroesophageal reflux disease)    ?barrett's, with esophageal dysmotility, on omeprazole  . Glucose intolerance (impaired glucose tolerance)  2009   Prediabetes  . Gout   . History of Non-STEMI (non-ST elevated myocardial infarction) 02/22/2004   Three-vessel disease --> referred for CABG  . History of Non-STEMI (non-ST elevated myocardial infarction) December 2011   Hazy lesion in SVG-OM1 -->  staged PCI with 4.0 mm x 20 mm vision BMS (4.5 mm)  . HLD (hyperlipidemia)    Statin intolerant  . Hypertension, benign   . Ischemic cardiomyopathy Echo 10/2012   EF ~40%; moderate Posterior HypoKinesis, minld-moderate inferior Hypokinesis; mild RV dilation, mild concentric LVH  . 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 Stable angina ([place]) 11/2012   Chronic,some what stable but still present; cardiac cath results above, no significant change from 2012  . Testosterone deficiency    On replacement; goal is low normal.  . Varicose veins December 2013   with venous reflux status VNUS ablation of left greater saphenous wein    Patient Active Problem List   Diagnosis Date Noted  . Upper respiratory infection 12/21/2017  . Gout   . Pain in lateral portion of right ankle 09/17/2017  . Headache 04/29/2017  . Health maintenance examination 10/13/2016  . Obstructive sleep apnea of adult 01/17/2016  . Medicare annual wellness visit, subsequent 11/21/2014  . Advanced care planning/counseling discussion 11/21/2014  . Varicose veins of leg with swelling 09/18/2013  . Severe obesity (BMI 35.0-39.9) with comorbidity ([place]) 09/18/2013  . CAD (coronary artery disease) of bypass graft   . Depressed mood 09/06/2013  . ED (erectile dysfunction) of organic origin 09/06/2013  . Ischemic cardiomyopathy   . Prediabetes 08/15/2013  . Stable angina: Class I-II 12/27/2012  . Abnormal cardiovascular stress tests - METS test with VO2 of ~50,  HIGH Risk Myoview - Anterior Ischemia 12/15/2012  . Hypogonadism male   . Atherosclerotic heart disease of native coronary artery with angina pectoris ([place])   . Barrett's esophagus   . Essential hypertension   . Hyperlipidemia with target LDL less than 70 with statin related symptoms   . Smoker   . History of percutaneous coronary intervention 12/28/2010  . Statin intolerance 12/05/2009  . 3-vessel CAD 02/22/2004  . S/P CABG x 4  01/30/2004    Past Surgical History:  Procedure Laterality Date  . CARDIAC CATHETERIZATION  May 01 2011   knowwn occlusion of SVG to RCA  as well as native RCA ; PCI to the SVG to OM1 ;patent LIMA to LAD with diffuse distal  LAD 70-80%,small  vessel dx not thought to be amenable to PCI .RCA 100% occluded ,OM2 patent,Circ diseased aftr OM1. EF 45-50%  . CORONARY ANGIOPLASTY WITH STENT PLACEMENT  12/25/2010   s/p autologous vessel blockage  . CORONARY ARTERY BYPASS GRAFT  02/27/04   LIMA -LAD, Free Radial-OM2 -- patent; SVG-OM1, SVG-RCA, - 100% occluded by recent cath  . ESOPHAGOGASTRODUODENOSCOPY    . LEFT HEART CATHETERIZATION WITH CORONARY/GRAFT ANGIOGRAM N/A 12/27/2012   Procedure: LEFT HEART CATHETERIZATION WITH Beatrix Fetters;  Surgeon: Leonie Man, MD;  Location: [facility] CATH LAB: Known nRCA, mLAD, OM1 & OM2 100%, Known SVG-RCA 100%, SVG-OM1 100% ISR. Patent LIMA-dLAD w/ 60-80% dLAD. Patent frRAD-OM2. LCx-RPL collaterals.   EF ~35%  . lower venous extremity doppler Left 08/15/2011   normal ,s/p vein ablation  . MET/CPET  11/02/2012   suboptimal effort but peak VO2  was 52% which relatively significant. Myoview was suggestive for ant. ischemia may go along with his distal LAD   . NM MYOCAR PERF WALL MOTION  12/15/2012   high risk study  . TRANSTHORACIC ECHOCARDIOGRAM  10/2012   mild LVH, EF 40%, mod posterior and inf wall hypokinesis,mild concentric LVH;; RV dilated, normal  fx.trace MR  . VEIN SURGERY  2011   solomon - h/o vericose veins       Home Medications    Prior to Admission medications   Medication Sig Start Date End Date Taking? Authorizing Provider  albuterol (PROVENTIL HFA;VENTOLIN HFA) 108 (90 BASE) MCG/ACT inhaler Inhale 2 puffs into the lungs every 4 (four) hours as needed for wheezing or shortness of breath. 11/24/15   Janne Napoleon, NP  ALPRAZolam Duanne Moron) 0.25 MG tablet Take 1 tablet (0.25 mg total) by mouth 2 (two) times daily as needed for anxiety.  10/16/17   Ria Bush, MD  APPLE CIDER VINEGAR PO Take by mouth.    [provider]  azithromycin (ZITHROMAX) 250 MG tablet Take two tablets on day one followed by one tablet on days 2-5 12/21/17   Ria Bush, MD  bisoprolol (ZEBETA) 5 MG tablet Take 0.5 tablets (2.5 mg total) by mouth daily. 09/03/17   Leonie Man, MD  co-enzyme Q-10 30 MG capsule Take 1 capsule (30 mg total) by mouth daily. 04/29/17   Ria Bush, MD  COLCRYS 0.6 MG tablet TAKE AS DIRECTED 2 TABLETS BY MOUTH ON FIRST DAY THEN ONE DAILY AS NEEDED 10/16/17   Ria Bush, MD  diclofenac sodium (VOLTAREN) 1 % GEL Apply 2 g topically 3 (three) times daily. 12/01/17   Ria Bush, MD  fluorouracil (EFUDEX) 5 % cream Apply topically 2 (two) times daily. 11/07/14   Hyatt, Max T, DPM  fluticasone (FLONASE) 50 MCG/ACT nasal spray Place 2 sprays into both nostrils daily. 04/29/17  Ria Bush, MD  HYDROcodone-homatropine [facility]) 5-1.5 MG/5ML syrup Take 5 mLs by mouth every 6 (six) hours as needed for cough (sedation precautions). 12/24/17   Ria Bush, MD  lisinopril (PRINIVIL,ZESTRIL) 2.5 MG tablet Take 1 tablet (2.5 mg total) by mouth daily. 06/16/17 09/14/17  Leonie Man, MD  nitroGLYCERIN (NITROSTAT) 0.4 MG SL tablet Place 1 tablet (0.4 mg total) under the tongue every 5 (five) minutes as needed for chest pain. 10/13/16   Ria Bush, MD  omeprazole (PRILOSEC OTC) 20 MG tablet Take 20 mg by mouth daily.    [provider]  prasugrel (EFFIENT) 10 MG TABS tablet TAKE 1 TABLET EVERY DAY 09/30/17   Leonie Man, MD  pravastatin (PRAVACHOL) 40 MG tablet Take 1 tablet (40 mg total) by mouth daily. Patient taking differently: Take 40 mg by mouth every other day. Pt states he doesn't take every day 12/19/16   Ria Bush, MD  ranolazine (RANEXA) 1000 MG SR tablet Take 1 tablet (1,000 mg total) by mouth twice daily, as directed. 09/03/17   Leonie Man, MD    triamcinolone cream (KENALOG) 0.1 % Apply 1 application topically 2 (two) times daily as needed.    [provider]  TURMERIC PO Take 2 capsules by mouth daily.    [provider]  vitamin B-12 (CYANOCOBALAMIN) 500 MCG tablet Take 500 mcg by mouth daily.    [provider]    Family History Family History  Problem Relation Age of Onset  . Diabetes Paternal Aunt   . Coronary artery disease Paternal Uncle   . Stroke Maternal Uncle   . Cancer Neg Hx     Social History Social History   Tobacco Use  . Smoking status: Former Smoker    Packs/day: 0.50    Years: 20.00    Pack years: 10.00    Types: Cigarettes    Last attempt to quit: 04/28/2012    Years since quitting: 5.6  . Smokeless tobacco: Never Used  . Tobacco comment: long time smoker, smokes occasional -anxiety  Substance Use Topics  . Alcohol use: Yes    Alcohol/week: 0.0 oz    Comment: occasionally  . Drug use: No     Allergies   Statins; Zetia [ezetimibe]; Clopidogrel; and Codeine   Review of Systems Review of Systems  Constitutional: Positive for fever (per patient 99.8).  HENT: Positive for congestion. Negative for ear pain, rhinorrhea and sore throat.   Eyes: Negative for pain, discharge and itching.  Respiratory: Positive for cough and shortness of breath (with coughing. ).   Cardiovascular: Positive for chest pain (with coughing. ).  Gastrointestinal: Negative for abdominal pain.    Physical Exam Updated Vital Signs BP (!) 151/106 (BP Location: Left Arm)   Pulse 98   Temp 97.9 F (36.6 C) (Oral)   Resp 18   SpO2 99%   Physical Exam  Constitutional: He appears well-developed and well-nourished.  Non-toxic appearance. No distress.  HENT:  Head: Normocephalic and atraumatic.  Right Ear: Tympanic membrane is not erythematous, not retracted and not bulging.  Left Ear: Tympanic membrane is not erythematous, not retracted and not bulging.  Nose: Mucosal edema present.   Mouth/Throat: Uvula is midline and oropharynx is clear and moist. No oropharyngeal exudate or posterior oropharyngeal erythema.  Eyes: Conjunctivae are normal. Pupils are equal, round, and reactive to light. Right eye exhibits no discharge. Left eye exhibits no discharge.  Cardiovascular: Normal rate and regular rhythm.  No murmur heard. Pulmonary/Chest: Breath  sounds normal. No respiratory distress. He has no wheezes. He has no rales.  Abdominal: Soft. He exhibits no distension. There is no tenderness.  Lymphadenopathy:    He has no cervical adenopathy.  Neurological: He is alert.  Clear speech.   Skin: Skin is warm and dry. No rash noted.  Psychiatric: He has a normal mood and affect. His behavior is normal.  Nursing note and vitals reviewed.   ED Treatments / Results  Labs (all labs ordered are listed, but only abnormal results are displayed) Labs Reviewed - No data to display  EKG  EKG Interpretation None       Radiology Dg Chest 2 View  Result Date: 12/27/2017 CLINICAL DATA:  Cough for the past week. EXAM: CHEST  2 VIEW COMPARISON:  Chest x-ray dated December 21, 2012. FINDINGS: Mild cardiomegaly. Prior CABG. Normal pulmonary vascularity. No focal consolidation, pleural effusion, or pneumothorax. No acute osseous abnormality. IMPRESSION: Mild cardiomegaly.  No active cardiopulmonary disease. Electronically Signed   By: Titus Dubin M.D.   On: 12/27/2017 13:55    Procedures Procedures (including critical care time)  Medications Ordered in ED Medications - No data to display   Initial Impression / Assessment and Plan / ED Course  I have reviewed the triage vital signs and the nursing notes.  Pertinent labs & imaging results that were available during my care of the patient were reviewed by me and considered in my medical decision making (see chart for details).  Patient presents with cough. He is nontoxic appearing with stable vital signs, BP noted to be  elevated- hx of HTN, no respiratory distress. Patient is afebrile and without adventitious sounds on lung exam, CXR negative for infiltrate, doubt PNA. No wheezing on exam. Afebrile, no sinus tenderness, doubt sinusitis. Centor score 0, doubt strep pharyngitis. CXR did show mild cardiomegaly, this has been noted on prior imaging and is not new. Suspect viral etiology, will treat supportively with Tessalon. Patient's blood pressure elevated in the emergency department today. Patient denies headache, change in vision, numbness, weakness, chest pain or dyspnea not related to cough, dizziness, or lightheadedness therefore doubt hypertensive emergency. Discussed elevated blood pressure with the patient and the need for primary care follow up for BP recheck. I discussed results, treatment plan, need for PCP follow-up, and return precautions with the patient. Provided opportunity for questions, patient confirmed understanding and is in agreement with plan.   Vitals:   12/27/17 1115 12/27/17 1409  BP: (!) 151/106 (!) 132/101  Pulse: 98 81  Resp: 18 16  Temp: 97.9 F (36.6 C)   SpO2: 99% 100%     Final Clinical Impressions(s) / ED Diagnoses   Final diagnoses:  Cough    ED Discharge Orders        Ordered    benzonatate (TESSALON) 100 MG capsule  3 times daily PRN     12/27/17 1400       Resa Rinks, [place] R, PA-C 12/27/17 1443    Macarthur Critchley, MD 12/30/17 0007

## 2017-12-27 NOTE — Discharge Instructions (Addendum)
You were seen in the emergency department today for a persistent cough.  Your chest x-ray did not show any signs of pneumonia.   I have give you a prescription for Tessalon, this is a cough medicine you can taken every 8 hours as needed for the cough.   Your blood pressure was elevated in the emergency department today. You will need to have this rechecked by your primary care provider in 1 week.  Vitals:   12/27/17 1115  BP: (!) 151/106  Pulse: 98  Resp: 18  Temp: 97.9 F (36.6 C)  SpO2: 99%    Follow up with your primary care doctor in the next 1 week for re-evaluation of your symptoms. Return to the emergency department for any new or worsening symptoms including, but not limited to difficulty breathing or chest pain not specifically with coughing, fever >100.3 that does not improve with tylenol, or any other concerning symptoms.

## 2017-12-27 NOTE — ED Triage Notes (Signed)
Pt. Stated, Calvin Francis had a cough for over a week. Ive had a ZPak. And Im stilol having a cough.

## 2018-01-11 IMAGING — DX DG ANKLE COMPLETE 3+V*R*
3 series · 3 of 3 positions shown · non-contrast
Comparison: None.

CLINICAL DATA: Bilateral ankle pain

EXAM:
RIGHT ANKLE - COMPLETE 3+ VIEW

[ankle ap]
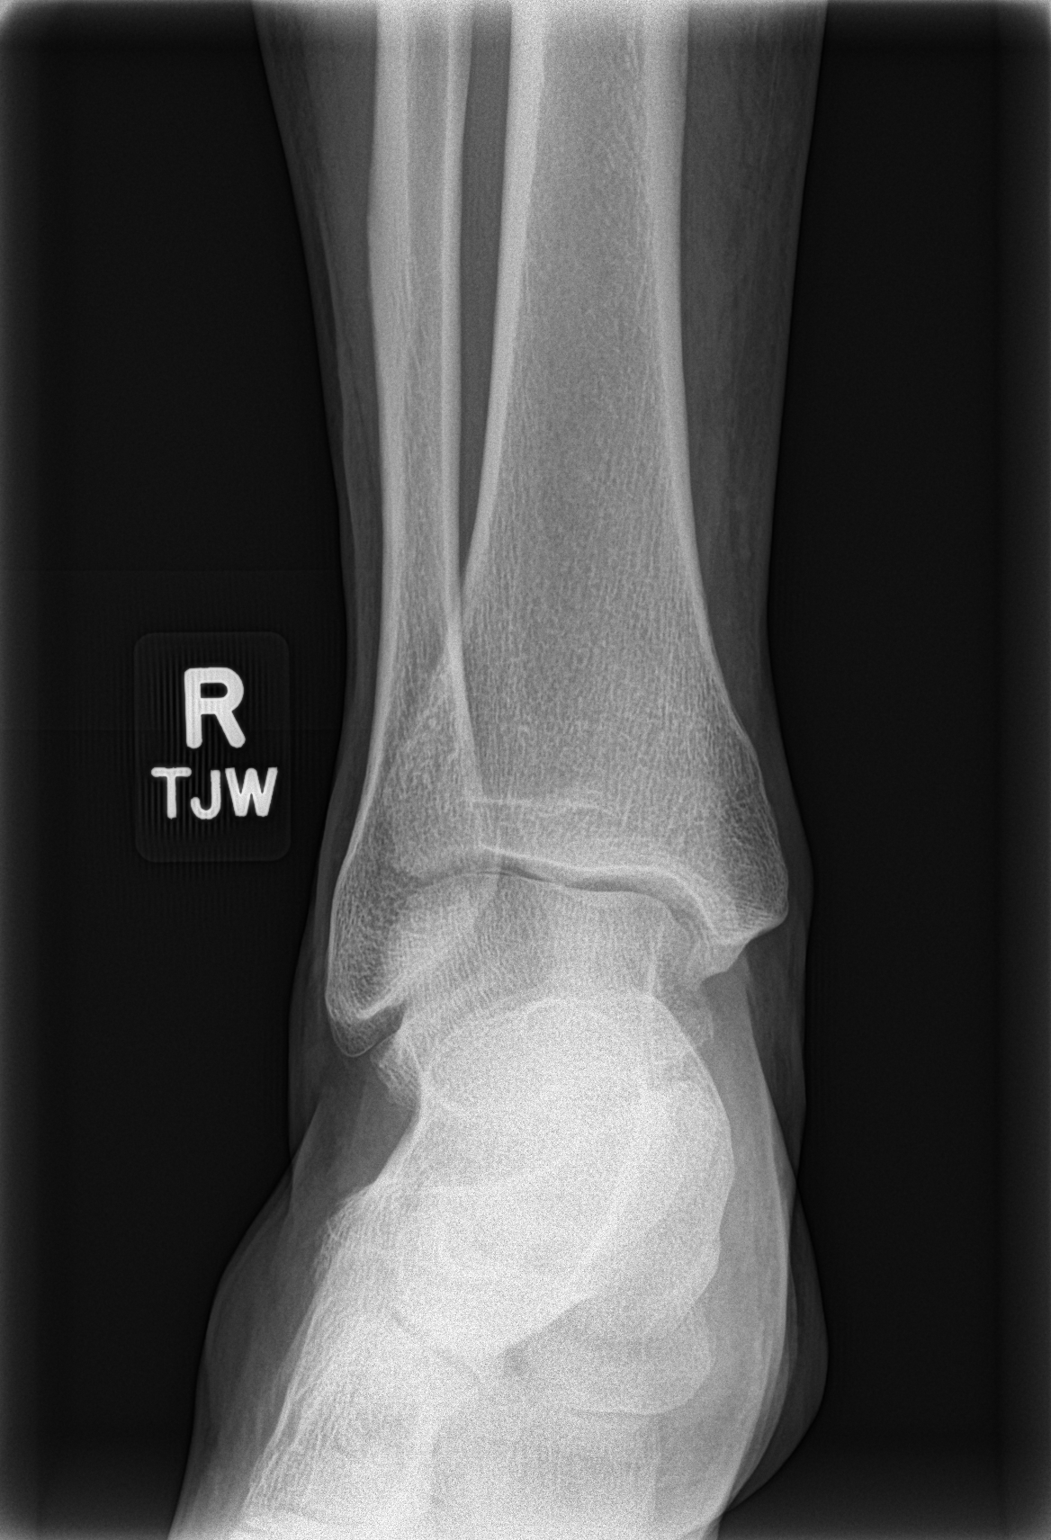

[ankle obl]
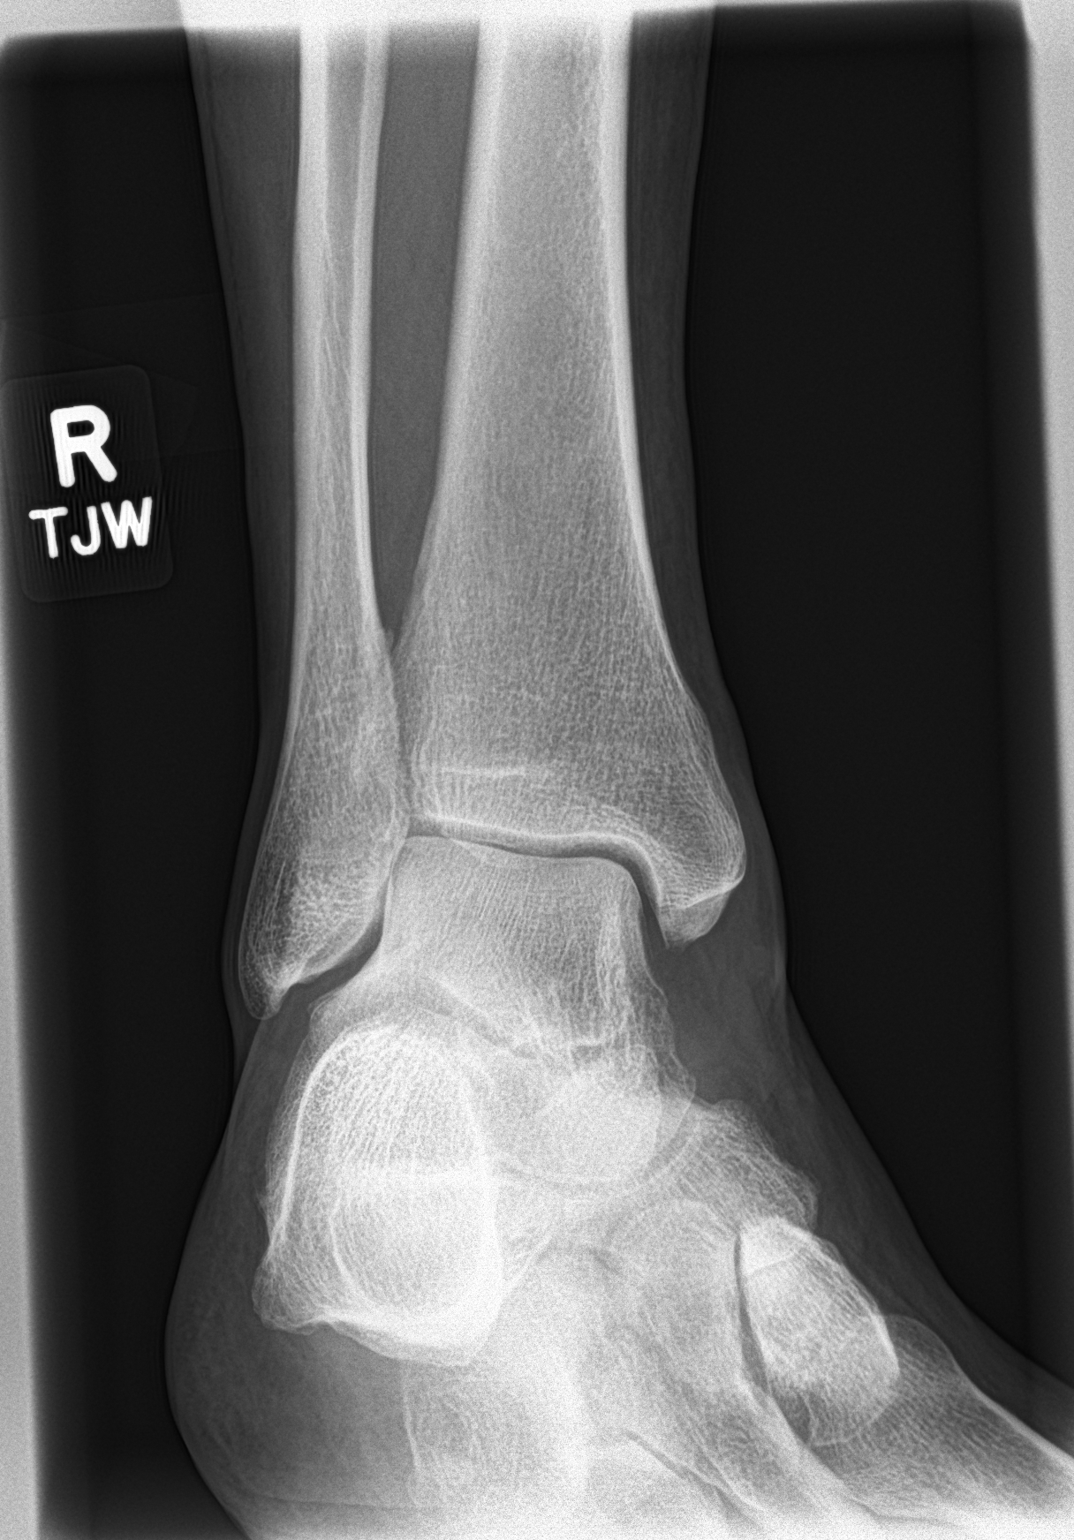

[ankle lat]
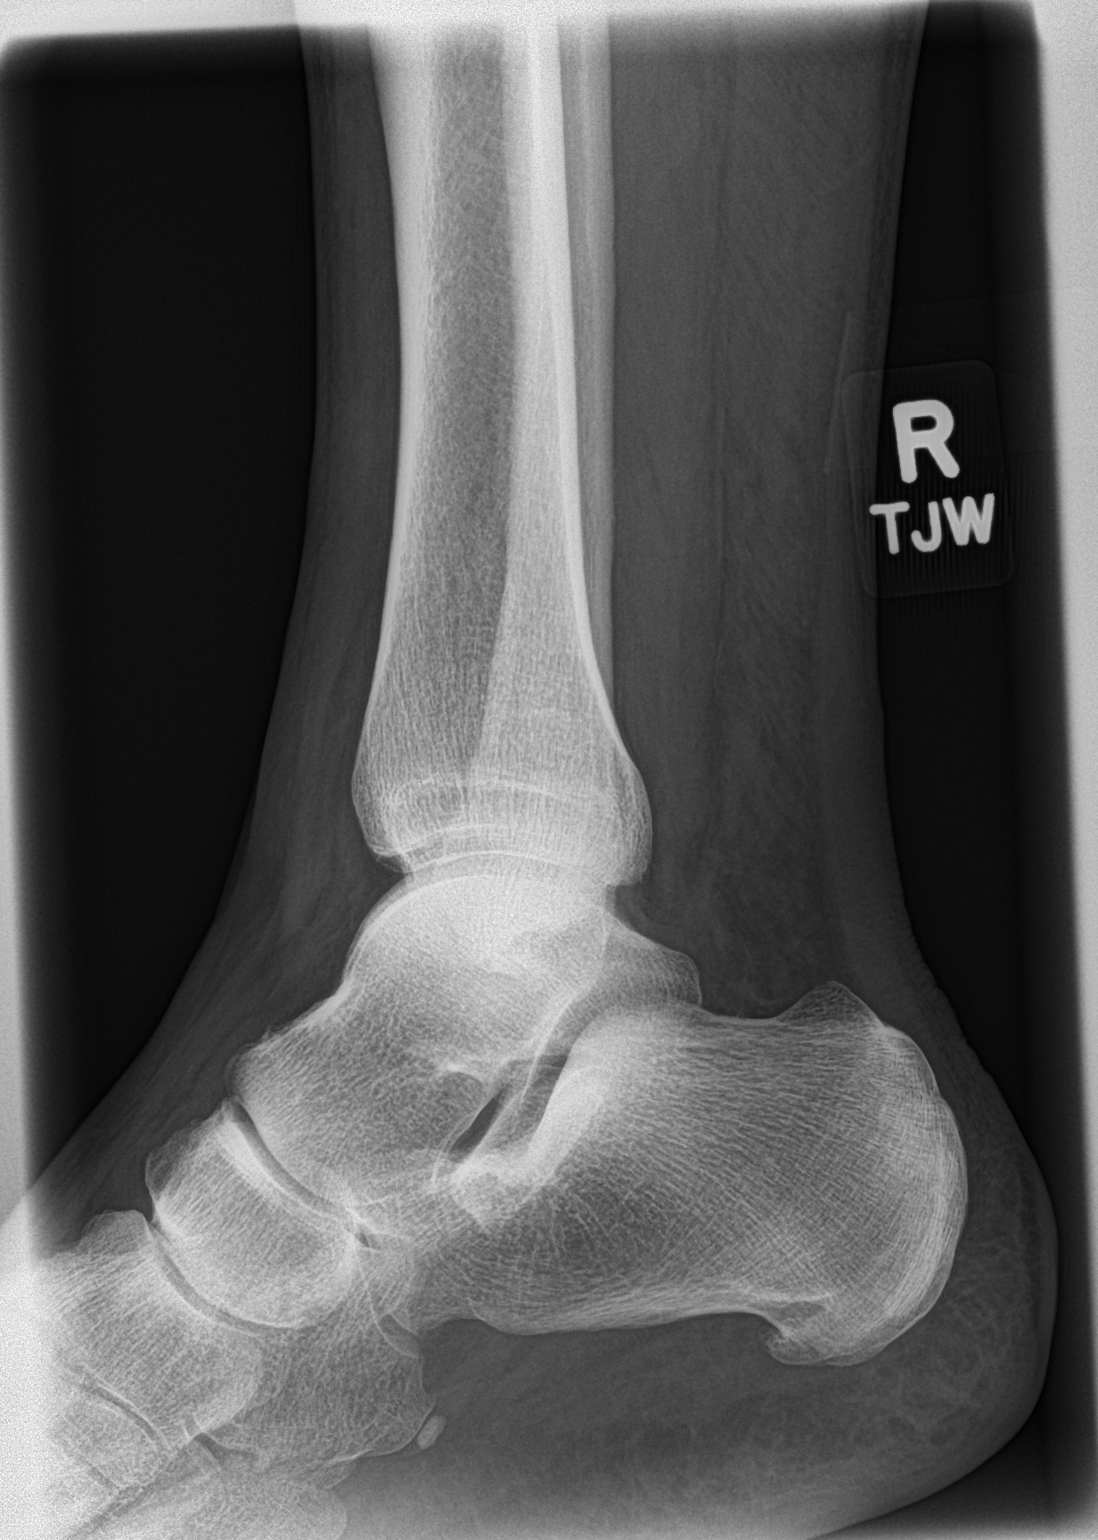

[3 of 3 positions shown; findings below may reference images not displayed]

FINDINGS: Small plantar calcaneal spur. No acute bony abnormality.
Specifically, no fracture, subluxation, or dislocation. Soft tissues
are intact. Joint spaces maintained.
IMPRESSION: No acute bony abnormality.

## 2018-01-17 NOTE — Progress Notes (Signed)
[facility] [address], [place] [address] Phone: [phone] Subjective:    I'm seeing this patient by the request  of:  Ria Bush, MD   CC: Ankle pain  [id number]  Calvin Francis is a 57 y.o. male coming in with complaint of right ankle pain.  Has seen primary care provider previously and has been diagnosed with a pes planus with peroneal tendinitis. Has had xrays and was told nothing was wrong. Has a history of plantar fascia and gout in his big toe. States the back of his calf is sore with sharp pain. Calf bothers him when he does a lot of walking  Onset- Chronic Location - Lateral Duration- Keeps him up at night. Some days worse than others Character- Sharp pain in calf Aggravating factors- laying down Reliving factors- Ibuprofen  Therapies tried- Deep Blue, heat Severity-9 out of 10   To have ankle x-rays taken November 23, 2017 these were independently visualized by me showing minimal osteoarthritic changes.  Past Medical History:  Diagnosis Date  . 3-vessel CAD 02/22/2004   mid RCA 100% occluded, L-R collaterals; LAD 60-70% bifurcation lesion; Cx proximal 70% and mid 80% after OM1, OM1 100% occluded. --> CABG x 4  . Barrett's esophagus    s/p EGD several years ago with [place]  . Bronchitis, mucopurulent recurrent ([place])   . CAD (coronary artery disease) of bypass graft 5/'12; 12/'13   Cath for angina and Abn Myoview ST: SVG-OM1 now occluded, attempt at PCI to the native circumflex OM1 unsuccessful; distal LAD beyond patent LIMA 70-80% (not PCI amenable); free radial-OM 2 patent; EF 45-50%  . Erectile dysfunction   . Former heavy cigarette smoker (20-39 per day) May 2013   . GERD (gastroesophageal reflux disease)    ?barrett's, with esophageal dysmotility, on omeprazole  . Glucose intolerance (impaired glucose tolerance)  2009   Prediabetes  . Gout   . History of Non-STEMI (non-ST elevated myocardial infarction)  02/22/2004   Three-vessel disease --> referred for CABG  . History of Non-STEMI (non-ST elevated myocardial infarction) December 2011   Hazy lesion in SVG-OM1 --> staged PCI with 4.0 mm x 20 mm vision BMS (4.5 mm)  . HLD (hyperlipidemia)    Statin intolerant  . Hypertension, benign   . Ischemic cardiomyopathy Echo 10/2012   EF ~40%; moderate Posterior HypoKinesis, minld-moderate inferior Hypokinesis; mild RV dilation, mild concentric LVH  . 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 Stable angina ([place]) 11/2012   Chronic,some what stable but still present; cardiac cath results above, no significant change from 2012  . Testosterone deficiency    On replacement; goal is low normal.  . Varicose veins December 2013   with venous reflux status VNUS ablation of left greater saphenous wein   Past Surgical History:  Procedure Laterality Date  . CARDIAC CATHETERIZATION  May 01 2011   knowwn occlusion of SVG to RCA  as well as native RCA ; PCI to the SVG to OM1 ;patent LIMA to LAD with diffuse distal  LAD 70-80%,small  vessel dx not thought to be amenable to PCI .RCA 100% occluded ,OM2 patent,Circ diseased aftr OM1. EF 45-50%  . CORONARY ANGIOPLASTY WITH STENT PLACEMENT  12/25/2010   s/p autologous vessel blockage  . CORONARY ARTERY BYPASS GRAFT  02/27/04   LIMA -LAD, Free Radial-OM2 -- patent; SVG-OM1, SVG-RCA, - 100% occluded by recent cath  . ESOPHAGOGASTRODUODENOSCOPY    .  LEFT HEART CATHETERIZATION WITH CORONARY/GRAFT ANGIOGRAM N/A 12/27/2012   Procedure: LEFT HEART CATHETERIZATION WITH Beatrix Fetters;  Surgeon: Leonie Man, MD;  Location: [facility] CATH LAB: Known nRCA, mLAD, OM1 & OM2 100%, Known SVG-RCA 100%, SVG-OM1 100% ISR. Patent LIMA-dLAD w/ 60-80% dLAD. Patent frRAD-OM2. LCx-RPL collaterals.   EF ~35%  . lower venous extremity doppler Left 08/15/2011   normal ,s/p vein ablation  . MET/CPET  11/02/2012   suboptimal effort but peak VO2  was 52%  which relatively significant. Myoview was suggestive for ant. ischemia may go along with his distal LAD   . NM MYOCAR PERF WALL MOTION  12/15/2012   high risk study  . TRANSTHORACIC ECHOCARDIOGRAM  10/2012   mild LVH, EF 40%, mod posterior and inf wall hypokinesis,mild concentric LVH;; RV dilated, normal  fx.trace MR  . VEIN SURGERY  2011   solomon - h/o vericose veins   Social History   Socioeconomic History  . Marital status: Married    Spouse name: None  . Number of children: None  . Years of education: None  . Highest education level: None  Social Needs  . Financial resource strain: None  . Food insecurity - worry: None  . Food insecurity - inability: None  . Transportation needs - medical: None  . Transportation needs - non-medical: None  Occupational History  . None  Tobacco Use  . Smoking status: Former Smoker    Packs/day: 0.50    Years: 20.00    Pack years: 10.00    Types: Cigarettes    Last attempt to quit: 04/28/2012    Years since quitting: 5.7  . Smokeless tobacco: Never Used  . Tobacco comment: long time smoker, smokes occasional -anxiety  Substance and Sexual Activity  . Alcohol use: Yes    Alcohol/week: 0.0 oz    Comment: occasionally  . Drug use: No  . Sexual activity: Yes  Other Topics Concern  . None  Social History Narrative   Caffeine: 1 cup coffee in am   Lives with wife, 2 dogs   Married, 2 Grown children, 3 grandchildren   Occupation: disability from cardiac status for last year, prior worked for [employer]   Activity: walks driveway, limited by chest pain/SOB    Diet: good water, fruits/vegetables daily   Allergies  Allergen Reactions  . Statins   . Zetia [Ezetimibe]   . Clopidogrel Other (See Comments)    Disoriented   . Codeine Rash   Family History  Problem Relation Age of Onset  . Diabetes Paternal Aunt   . Coronary artery disease Paternal Uncle   . Stroke Maternal Uncle   . Cancer Neg Hx      Past medical history,  social, surgical and family history all reviewed in electronic medical record.  No pertanent information unless stated regarding to the chief complaint.   Review of Systems:Review of systems updated and as accurate as of 01/18/18  No headache, visual changes, nausea, vomiting, diarrhea, constipation, dizziness, abdominal pain, skin rash, fevers, chills, night sweats, weight loss, swollen lymph nodes, body aches, joint swelling, muscle aches, chest pain, shortness of breath, mood changes.  Positive muscle aches  Objective  Blood pressure 110/70, pulse 89, height 6' 2" (1.88 m), weight 245 lb (111.1 kg), SpO2 98 %. Systems examined below as of 01/18/18   General: No apparent distress alert and oriented x3 mood and affect normal, dressed appropriately.  HEENT: Pupils equal, extraocular movements intact  Respiratory: Patient's speak in full sentences and  does not appear short of breath  Cardiovascular: 2+ lower extremity edema, mild tender, no erythema severe varicose veins bilaterally Skin: Warm dry intact with no signs of infection or rash on extremities or on axial skeleton.  Abdomen: Soft nontender  Neuro: Cranial nerves II through XII are intact, neurovascularly intact in all extremities with 2+ DTRs and trace dorsalis pedis pulses.  Lymph: No lymphadenopathy of posterior or anterior cervical chain or axillae bilaterally.  Gait normal with good balance and coordination.  MSK:  Mild tender with full range of motion and good stability and symmetric strength and tone of shoulders, elbows, wrist, hip, knee bilaterally.  Ankle: Right Severe pronation of the hindfoot.  Patient does have significant insufficiency of the posterior tibialis.  Significant varicose veins noted. Range of motion is full in all directions. Strength is 5/5 in all directions. Stable lateral and medial ligaments; squeeze test and kleiger test unremarkable; Talar dome nontender; No pain at base of 5th MT; No tenderness over  cuboid; Mild pain over the navicular bone Tenderness of the calf noted bilaterally right greater than left Able to walk 4 steps. On the right foot does have significant breakdown of the transverse arch as well.  Contralateral ankle shows also overpronation of the hindfoot but not as severe.  Patient does not have as much pain to palpation.  Does have varicose veins noted.  MSK US performed of: Right ankle This study was ordered, performed, and interpreted by Charlann Boxer D.O.  Foot/Ankle:   All structures visualized.   Talar dome unremarkable  Ankle mortise without effusion. Patient posterior tibialis and does not appeared to function with dynamic testing.  Peroneal tendons does have some mild hypoechoic changes that seem to be more reactive.  Mild chronic changes of the joint that is more consistent with gout.  IMPRESSION: Posterior tibialis insufficiency, reactive peroneal tendinitis, possible low-level gout flare    Impression and Recommendations:     This case required medical decision making of moderate complexity.      Note: This dictation was prepared with Dragon dictation along with smaller phrase technology. Any transcriptional errors that result from this process are unintentional.

## 2018-01-18 ENCOUNTER — Ambulatory Visit: Payer: Self-pay

## 2018-01-18 ENCOUNTER — Ambulatory Visit: Payer: Medicare HMO | Admitting: Family Medicine

## 2018-01-18 ENCOUNTER — Encounter: Payer: Self-pay | Admitting: Family Medicine

## 2018-01-18 VITALS — BP 110/70 | HR 89 | Ht 74.0 in | Wt 245.0 lb

## 2018-01-18 DIAGNOSIS — M76829 Posterior tibial tendinitis, unspecified leg: Secondary | ICD-10-CM | POA: Insufficient documentation

## 2018-01-18 DIAGNOSIS — M1A9XX0 Chronic gout, unspecified, without tophus (tophi): Secondary | ICD-10-CM | POA: Diagnosis not present

## 2018-01-18 DIAGNOSIS — M2140 Flat foot [pes planus] (acquired), unspecified foot: Secondary | ICD-10-CM | POA: Diagnosis not present

## 2018-01-18 DIAGNOSIS — M25571 Pain in right ankle and joints of right foot: Secondary | ICD-10-CM

## 2018-01-18 DIAGNOSIS — M216X1 Other acquired deformities of right foot: Secondary | ICD-10-CM

## 2018-01-18 DIAGNOSIS — I739 Peripheral vascular disease, unspecified: Secondary | ICD-10-CM

## 2018-01-18 DIAGNOSIS — M216X2 Other acquired deformities of left foot: Secondary | ICD-10-CM | POA: Diagnosis not present

## 2018-01-18 NOTE — Assessment & Plan Note (Signed)
Discussed diet changes.   

## 2018-01-18 NOTE — Patient Instructions (Signed)
Good to see you  That foot is not the prettiest  Will take some time Good shoes with rigid bottom.  Dierdre HarnessKeen, Dansko, Merrell or New balance greater then 700 Spenco orthotics "total support" online would be great  OOFOS for inside sandals.  Wear the air cast with a a lot of walking Exercises 3 times a week.  I would do the compression socks as well daily.   Over the counter get  Vitamin D 2000 IU daily  Tart cherry extract any dose at night This will take some time See me again in 4 weeks

## 2018-01-18 NOTE — Assessment & Plan Note (Signed)
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  Patient does have significant posterior tibialis insufficiency on the right side.  We did discuss the possibility of custom orthotics but secondary to patient's body habitus I feel that thicker orthotics may be necessary in the long run.  We will try some over-the-counter ones, we discussed the importance of compression stockings, home exercises given as well as a Aircast to help with stability of the ankle.  We did discuss certain shoes that could be beneficial.  Over-the-counter medications to even help with the underlying gout that is likely contributing.  Follow-up with me again in 4 weeks

## 2018-01-18 NOTE — Assessment & Plan Note (Signed)
Patient is seen in cardiology.  Patient states that he did have an ABI.  Has had Doppler to rule out any type of clot.  Encourage patient to use the compression stockings on a more regular basis.

## 2018-03-02 ENCOUNTER — Emergency Department (HOSPITAL_COMMUNITY): Payer: Medicare HMO

## 2018-03-02 ENCOUNTER — Inpatient Hospital Stay (HOSPITAL_COMMUNITY)
Admission: EM | Admit: 2018-03-02 | Discharge: 2018-03-07 | DRG: 286 | Disposition: A | Discharge status: Home / Self Care | Payer: Medicare HMO | Attending: Cardiovascular Disease | Admitting: Cardiovascular Disease

## 2018-03-02 ENCOUNTER — Encounter (HOSPITAL_COMMUNITY): Payer: Self-pay | Admitting: Emergency Medicine

## 2018-03-02 ENCOUNTER — Other Ambulatory Visit: Payer: Self-pay

## 2018-03-02 DIAGNOSIS — R0683 Snoring: Secondary | ICD-10-CM

## 2018-03-02 DIAGNOSIS — R7989 Other specified abnormal findings of blood chemistry: Secondary | ICD-10-CM

## 2018-03-02 DIAGNOSIS — I420 Dilated cardiomyopathy: Secondary | ICD-10-CM | POA: Diagnosis not present

## 2018-03-02 DIAGNOSIS — Z6830 Body mass index (BMI) 30.0-30.9, adult: Secondary | ICD-10-CM

## 2018-03-02 DIAGNOSIS — I10 Essential (primary) hypertension: Secondary | ICD-10-CM

## 2018-03-02 DIAGNOSIS — Z888 Allergy status to other drugs, medicaments and biological substances status: Secondary | ICD-10-CM

## 2018-03-02 DIAGNOSIS — Z87891 Personal history of nicotine dependence: Secondary | ICD-10-CM | POA: Diagnosis not present

## 2018-03-02 DIAGNOSIS — I252 Old myocardial infarction: Secondary | ICD-10-CM

## 2018-03-02 DIAGNOSIS — E785 Hyperlipidemia, unspecified: Secondary | ICD-10-CM

## 2018-03-02 DIAGNOSIS — N183 Chronic kidney disease, stage 3 (moderate): Secondary | ICD-10-CM | POA: Diagnosis present

## 2018-03-02 DIAGNOSIS — R0602 Shortness of breath: Secondary | ICD-10-CM | POA: Diagnosis present

## 2018-03-02 DIAGNOSIS — I110 Hypertensive heart disease with heart failure: Secondary | ICD-10-CM | POA: Diagnosis not present

## 2018-03-02 DIAGNOSIS — I25718 Atherosclerosis of autologous vein coronary artery bypass graft(s) with other forms of angina pectoris: Secondary | ICD-10-CM | POA: Diagnosis present

## 2018-03-02 DIAGNOSIS — Z955 Presence of coronary angioplasty implant and graft: Secondary | ICD-10-CM | POA: Diagnosis not present

## 2018-03-02 DIAGNOSIS — I259 Chronic ischemic heart disease, unspecified: Secondary | ICD-10-CM | POA: Diagnosis not present

## 2018-03-02 DIAGNOSIS — I214 Non-ST elevation (NSTEMI) myocardial infarction: Secondary | ICD-10-CM

## 2018-03-02 DIAGNOSIS — R079 Chest pain, unspecified: Secondary | ICD-10-CM | POA: Diagnosis not present

## 2018-03-02 DIAGNOSIS — I25708 Atherosclerosis of coronary artery bypass graft(s), unspecified, with other forms of angina pectoris: Secondary | ICD-10-CM | POA: Diagnosis not present

## 2018-03-02 DIAGNOSIS — I2582 Chronic total occlusion of coronary artery: Secondary | ICD-10-CM | POA: Diagnosis present

## 2018-03-02 DIAGNOSIS — I509 Heart failure, unspecified: Secondary | ICD-10-CM | POA: Diagnosis not present

## 2018-03-02 DIAGNOSIS — I25110 Atherosclerotic heart disease of native coronary artery with unstable angina pectoris: Secondary | ICD-10-CM

## 2018-03-02 DIAGNOSIS — I5023 Acute on chronic systolic (congestive) heart failure: Secondary | ICD-10-CM | POA: Diagnosis present

## 2018-03-02 DIAGNOSIS — I208 Other forms of angina pectoris: Secondary | ICD-10-CM | POA: Diagnosis present

## 2018-03-02 DIAGNOSIS — Z79899 Other long term (current) drug therapy: Secondary | ICD-10-CM | POA: Diagnosis not present

## 2018-03-02 DIAGNOSIS — Z885 Allergy status to narcotic agent status: Secondary | ICD-10-CM | POA: Diagnosis not present

## 2018-03-02 DIAGNOSIS — I130 Hypertensive heart and chronic kidney disease with heart failure and stage 1 through stage 4 chronic kidney disease, or unspecified chronic kidney disease: Secondary | ICD-10-CM | POA: Diagnosis not present

## 2018-03-02 DIAGNOSIS — I25119 Atherosclerotic heart disease of native coronary artery with unspecified angina pectoris: Secondary | ICD-10-CM | POA: Diagnosis not present

## 2018-03-02 DIAGNOSIS — R778 Other specified abnormalities of plasma proteins: Secondary | ICD-10-CM

## 2018-03-02 DIAGNOSIS — K219 Gastro-esophageal reflux disease without esophagitis: Secondary | ICD-10-CM | POA: Diagnosis present

## 2018-03-02 DIAGNOSIS — I255 Ischemic cardiomyopathy: Secondary | ICD-10-CM | POA: Diagnosis present

## 2018-03-02 DIAGNOSIS — G8929 Other chronic pain: Secondary | ICD-10-CM | POA: Diagnosis present

## 2018-03-02 DIAGNOSIS — G4733 Obstructive sleep apnea (adult) (pediatric): Secondary | ICD-10-CM | POA: Diagnosis present

## 2018-03-02 DIAGNOSIS — I519 Heart disease, unspecified: Secondary | ICD-10-CM | POA: Diagnosis not present

## 2018-03-02 DIAGNOSIS — Z951 Presence of aortocoronary bypass graft: Secondary | ICD-10-CM | POA: Diagnosis not present

## 2018-03-02 DIAGNOSIS — I25118 Atherosclerotic heart disease of native coronary artery with other forms of angina pectoris: Secondary | ICD-10-CM | POA: Diagnosis not present

## 2018-03-02 DIAGNOSIS — R748 Abnormal levels of other serum enzymes: Secondary | ICD-10-CM | POA: Diagnosis not present

## 2018-03-02 DIAGNOSIS — I340 Nonrheumatic mitral (valve) insufficiency: Secondary | ICD-10-CM | POA: Diagnosis not present

## 2018-03-02 DIAGNOSIS — J189 Pneumonia, unspecified organism: Secondary | ICD-10-CM | POA: Diagnosis not present

## 2018-03-02 DIAGNOSIS — J181 Lobar pneumonia, unspecified organism: Secondary | ICD-10-CM | POA: Diagnosis not present

## 2018-03-02 LAB — COMPREHENSIVE METABOLIC PANEL
ALBUMIN: 4.1 g/dL (ref 3.5–5.0)
ALK PHOS: 58 U/L (ref 38–126)
ALT: 19 U/L (ref 17–63)
ANION GAP: 9 (ref 5–15)
AST: 21 U/L (ref 15–41)
BILIRUBIN TOTAL: 0.6 mg/dL (ref 0.3–1.2)
BUN: 8 mg/dL (ref 6–20)
CALCIUM: 9.1 mg/dL (ref 8.9–10.3)
CO2: 23 mmol/L (ref 22–32)
Chloride: 106 mmol/L (ref 101–111)
Creatinine, Ser: 1.14 mg/dL (ref 0.61–1.24)
GFR calc non Af Amer: >60 mL/min (ref >60)
GLUCOSE: 100 mg/dL — AB (ref 65–99)
POTASSIUM: 4.3 mmol/L (ref 3.5–5.1)
Sodium: 138 mmol/L (ref 135–145)
TOTAL PROTEIN: 6.9 g/dL (ref 6.5–8.1)

## 2018-03-02 LAB — CBC WITH DIFFERENTIAL/PLATELET
BASOS ABS: 0 10*3/uL (ref 0.0–0.1)
BASOS PCT: 1 %
Eosinophils Absolute: 0.3 10*3/uL (ref 0.0–0.7)
Eosinophils Relative: 5 %
HEMATOCRIT: 37.1 % — AB (ref 39.0–52.0)
HEMOGLOBIN: 12.3 g/dL — AB (ref 13.0–17.0)
LYMPHS PCT: 30 %
Lymphs Abs: 1.8 10*3/uL (ref 0.7–4.0)
MCH: 28.1 pg (ref 26.0–34.0)
MCHC: 33.2 g/dL (ref 30.0–36.0)
MCV: 84.7 fL (ref 78.0–100.0)
Monocytes Absolute: 0.2 10*3/uL (ref 0.1–1.0)
Monocytes Relative: 4 %
NEUTROS ABS: 3.6 10*3/uL (ref 1.7–7.7)
NEUTROS PCT: 60 %
Platelets: 196 10*3/uL (ref 150–400)
RBC: 4.38 MIL/uL (ref 4.22–5.81)
RDW: 14.1 % (ref 11.5–15.5)
WBC: 5.9 10*3/uL (ref 4.0–10.5)

## 2018-03-02 LAB — TROPONIN I
TROPONIN I: 0.19 ng/mL — AB (ref <0.03)
Troponin I: 0.2 ng/mL (ref <0.03)

## 2018-03-02 LAB — HEPARIN LEVEL (UNFRACTIONATED): HEPARIN UNFRACTIONATED: 0.24 [IU]/mL — AB (ref 0.30–0.70)

## 2018-03-02 MED ORDER — ASPIRIN 81 MG PO CHEW
324.0000 mg | CHEWABLE_TABLET | ORAL | Status: AC
  Administered 2018-03-02: 324 mg via ORAL
  Filled 2018-03-02: qty 4

## 2018-03-02 MED ORDER — BISOPROLOL FUMARATE 5 MG PO TABS
5.0000 mg | ORAL_TABLET | Freq: Every day | ORAL | Status: DC
  Administered 2018-03-02 – 2018-03-05 (×4): 5 mg via ORAL
  Filled 2018-03-02 (×4): qty 1

## 2018-03-02 MED ORDER — EZETIMIBE 10 MG PO TABS
10.0000 mg | ORAL_TABLET | Freq: Every day | ORAL | Status: DC
  Administered 2018-03-03 – 2018-03-07 (×5): 10 mg via ORAL
  Filled 2018-03-02 (×6): qty 1

## 2018-03-02 MED ORDER — PRASUGREL HCL 10 MG PO TABS
10.0000 mg | ORAL_TABLET | Freq: Every day | ORAL | Status: DC
  Administered 2018-03-03 – 2018-03-07 (×5): 10 mg via ORAL
  Filled 2018-03-02 (×6): qty 1

## 2018-03-02 MED ORDER — SODIUM CHLORIDE 0.9 % IV SOLN
500.0000 mg | INTRAVENOUS | Status: DC
  Administered 2018-03-03: 500 mg via INTRAVENOUS
  Filled 2018-03-02 (×2): qty 500

## 2018-03-02 MED ORDER — HEPARIN (PORCINE) IN NACL 100-0.45 UNIT/ML-% IJ SOLN
1850.0000 [IU]/h | INTRAMUSCULAR | Status: DC
  Administered 2018-03-02: 1500 [IU]/h via INTRAVENOUS
  Administered 2018-03-03: 1650 [IU]/h via INTRAVENOUS
  Filled 2018-03-02 (×3): qty 250

## 2018-03-02 MED ORDER — ALBUTEROL SULFATE HFA 108 (90 BASE) MCG/ACT IN AERS: 2.0000 | INHALATION_SPRAY | RESPIRATORY_TRACT | Status: DC | PRN

## 2018-03-02 MED ORDER — ACETAMINOPHEN 325 MG PO TABS: 650.0000 mg | ORAL_TABLET | ORAL | Status: DC | PRN

## 2018-03-02 MED ORDER — GUAIFENESIN-DM 100-10 MG/5ML PO SYRP
15.0000 mL | ORAL_SOLUTION | ORAL | Status: DC | PRN
  Administered 2018-03-02 – 2018-03-03 (×2): 15 mL via ORAL
  Filled 2018-03-02 (×2): qty 15

## 2018-03-02 MED ORDER — HEPARIN BOLUS VIA INFUSION
1500.0000 [IU] | Freq: Once | INTRAVENOUS | Status: AC
Start: 2018-03-02 — End: 2018-03-02
  Administered 2018-03-02: 1500 [IU] via INTRAVENOUS
  Filled 2018-03-02: qty 1500

## 2018-03-02 MED ORDER — RANOLAZINE ER 500 MG PO TB12
1000.0000 mg | ORAL_TABLET | Freq: Two times a day (BID) | ORAL | Status: DC
  Administered 2018-03-02 – 2018-03-07 (×10): 1000 mg via ORAL
  Filled 2018-03-02 (×10): qty 2

## 2018-03-02 MED ORDER — SODIUM CHLORIDE 0.9 % IV SOLN
500.0000 mg | Freq: Once | INTRAVENOUS | Status: AC
  Administered 2018-03-02: 500 mg via INTRAVENOUS
  Filled 2018-03-02: qty 500

## 2018-03-02 MED ORDER — SODIUM CHLORIDE 0.9 % IV SOLN
1.0000 g | Freq: Once | INTRAVENOUS | Status: AC
  Administered 2018-03-02: 1 g via INTRAVENOUS
  Filled 2018-03-02: qty 10

## 2018-03-02 MED ORDER — IOPAMIDOL (ISOVUE-370) INJECTION 76%
INTRAVENOUS | Status: AC
  Administered 2018-03-02: 100 mL
  Filled 2018-03-02: qty 100

## 2018-03-02 MED ORDER — HEPARIN (PORCINE) IN NACL 100-0.45 UNIT/ML-% IJ SOLN: 14.0000 [IU]/kg/h | Freq: Once | INTRAMUSCULAR | Status: DC

## 2018-03-02 MED ORDER — HEPARIN SODIUM (PORCINE) 5000 UNIT/ML IJ SOLN
4000.0000 [IU] | Freq: Once | INTRAMUSCULAR | Status: DC
  Filled 2018-03-02: qty 1

## 2018-03-02 MED ORDER — ONDANSETRON HCL 4 MG/2ML IJ SOLN: 4.0000 mg | Freq: Four times a day (QID) | INTRAMUSCULAR | Status: DC | PRN

## 2018-03-02 MED ORDER — LISINOPRIL 2.5 MG PO TABS
2.5000 mg | ORAL_TABLET | Freq: Every day | ORAL | Status: DC
  Administered 2018-03-03 – 2018-03-04 (×2): 2.5 mg via ORAL
  Filled 2018-03-02 (×3): qty 1

## 2018-03-02 MED ORDER — ALBUTEROL SULFATE (2.5 MG/3ML) 0.083% IN NEBU: 2.5000 mg | INHALATION_SOLUTION | RESPIRATORY_TRACT | Status: DC | PRN

## 2018-03-02 MED ORDER — ASPIRIN 300 MG RE SUPP: 300.0000 mg | RECTAL | Status: AC

## 2018-03-02 MED ORDER — HEPARIN BOLUS VIA INFUSION
4000.0000 [IU] | Freq: Once | INTRAVENOUS | Status: AC
  Administered 2018-03-02: 4000 [IU] via INTRAVENOUS
  Filled 2018-03-02: qty 4000

## 2018-03-02 MED ORDER — NITROGLYCERIN 0.4 MG SL SUBL: 0.4000 mg | SUBLINGUAL_TABLET | SUBLINGUAL | Status: DC | PRN

## 2018-03-02 MED ORDER — SODIUM CHLORIDE 0.9 % IV SOLN
1.0000 g | INTRAVENOUS | Status: DC
  Administered 2018-03-03 – 2018-03-04 (×2): 1 g via INTRAVENOUS
  Filled 2018-03-02 (×3): qty 10

## 2018-03-02 MED ORDER — AMLODIPINE BESYLATE 5 MG PO TABS
5.0000 mg | ORAL_TABLET | Freq: Every day | ORAL | Status: DC
  Administered 2018-03-02 – 2018-03-04 (×3): 5 mg via ORAL
  Filled 2018-03-02 (×3): qty 1

## 2018-03-02 MED ORDER — BENZONATATE 100 MG PO CAPS
100.0000 mg | ORAL_CAPSULE | Freq: Once | ORAL | Status: AC
  Administered 2018-03-02: 100 mg via ORAL
  Filled 2018-03-02: qty 1

## 2018-03-02 MED ORDER — PRAVASTATIN SODIUM 40 MG PO TABS
40.0000 mg | ORAL_TABLET | Freq: Every day | ORAL | Status: DC
  Administered 2018-03-03 – 2018-03-07 (×5): 40 mg via ORAL
  Filled 2018-03-02 (×6): qty 1

## 2018-03-02 MED ORDER — ASPIRIN EC 81 MG PO TBEC
81.0000 mg | DELAYED_RELEASE_TABLET | Freq: Every day | ORAL | Status: DC
  Administered 2018-03-03 – 2018-03-04 (×2): 81 mg via ORAL
  Filled 2018-03-02 (×2): qty 1

## 2018-03-02 NOTE — Care Management Obs Status (Signed)
MEDICARE OBSERVATION STATUS NOTIFICATION   Patient Details  Name: Calvin Francis MRN: [id number] Date of Birth: 03-30-1961   Medicare Observation Status Notification Given:  Yes    Antony HasteBennett, Tanecia Mccay Harris, RN 03/02/2018, 6:07 PM

## 2018-03-02 NOTE — ED Notes (Signed)
Heparin verified with Crystal RN 

## 2018-03-02 NOTE — ED Notes (Signed)
Cardiology at the bedside.

## 2018-03-02 NOTE — ED Notes (Signed)
Patient transported to CT with primary RN. 

## 2018-03-02 NOTE — ED Triage Notes (Signed)
Pt arrives pov with c/o of 6/10 cp along with sob.  Pt states he intermittently has CP every day along with the mentioned sob but states he now has periods where he isn't breathing during the night.  Pt has a HX of cardiac stent placement and triple bypass surgery.  Pt states he has an appointment this Friday with his cardiologist but felt like he shouldn't wait.  Pt in NAD during triage.

## 2018-03-02 NOTE — ED Notes (Signed)
Patient transported to CT 

## 2018-03-02 NOTE — ED Provider Notes (Signed)
[facility] EMERGENCY DEPARTMENT Provider Note   CSN: [id number] Arrival date & time: 03/02/18  1572     History   Chief Complaint No chief complaint on file.   HPI Calvin Francis is a 57 y.o. male.  Patient with known history of severe coronary artery disease.  Has had bypass surgery in 2005.  Most recent stent was in 2012 but it failed.  Cardiology states that there is no more they can do is regarding stents or either bypass surgery.  He has been considered as a possible heart transplant patient.  Patient came in mostly for shortness of breath.  He does get chest pain every day.  Shortness of breath is been occurring mostly at night since Friday.  Also a little bit of increase in his normal chest pain for the past 2 weeks.  Patient is on platelet inhibitors.  Patient followed by cardiology group.      Past Medical History:  Diagnosis Date  . 3-vessel CAD 02/22/2004   mid RCA 100% occluded, L-R collaterals; LAD 60-70% bifurcation lesion; Cx proximal 70% and mid 80% after OM1, OM1 100% occluded. --> CABG x 4  . Barrett's esophagus    s/p EGD several years ago with [facility]  . Bronchitis, mucopurulent recurrent ([place])   . CAD (coronary artery disease) of bypass graft 5/'12; 12/'13   Cath for angina and Abn Myoview ST: SVG-OM1 now occluded, attempt at PCI to the native circumflex OM1 unsuccessful; distal LAD beyond patent LIMA 70-80% (not PCI amenable); free radial-OM 2 patent; EF 45-50%  . Erectile dysfunction   . Former heavy cigarette smoker (20-39 per day) May 2013   . GERD (gastroesophageal reflux disease)    ?barrett's, with esophageal dysmotility, on omeprazole  . Glucose intolerance (impaired glucose tolerance)  2009   Prediabetes  . Gout   . History of Non-STEMI (non-ST elevated myocardial infarction) 02/22/2004   Three-vessel disease --> referred for CABG  . History of Non-STEMI (non-ST elevated myocardial infarction) December 2011   Hazy lesion in  SVG-OM1 --> staged PCI with 4.0 mm x 20 mm vision BMS (4.5 mm)  . HLD (hyperlipidemia)    Statin intolerant  . Hypertension, benign   . Ischemic cardiomyopathy Echo 10/2012   EF ~40%; moderate Posterior HypoKinesis, minld-moderate inferior Hypokinesis; mild RV dilation, mild concentric LVH  . 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 Stable angina ([place]) 11/2012   Chronic,some what stable but still present; cardiac cath results above, no significant change from 2012  . Testosterone deficiency    On replacement; goal is low normal.  . Varicose veins December 2013   with venous reflux status VNUS ablation of left greater saphenous wein    Patient Active Problem List   Diagnosis Date Noted  . Chest pain 12/10/2018  . Acquired bilateral ankle pronation 01/18/2018  . Posterior tibialis tendon insufficiency 01/18/2018  . Peripheral vascular insufficiency ([place]) 01/18/2018  . Upper respiratory infection 12/21/2017  . Gout   . Pain in lateral portion of right ankle 09/17/2017  . Headache 04/29/2017  . Health maintenance examination 10/13/2016  . Obstructive sleep apnea of adult 01/17/2016  . Medicare annual wellness visit, subsequent 11/21/2014  . Advanced care planning/counseling discussion 11/21/2014  . Varicose veins of leg with swelling 09/18/2013  . Severe obesity (BMI 35.0-39.9) with comorbidity ([place]) 09/18/2013  . CAD (coronary artery disease) of bypass graft   . Depressed mood 09/06/2013  .  ED (erectile dysfunction) of organic origin 09/06/2013  . Ischemic cardiomyopathy   . Prediabetes 08/15/2013  . Stable angina: Class I-II 12/27/2012  . Abnormal cardiovascular stress tests - METS test with VO2 of ~50, HIGH Risk Myoview - Anterior Ischemia 12/15/2012  . Hypogonadism male   . Atherosclerotic heart disease of native coronary artery with angina pectoris ([place])   . Barrett's esophagus   . Essential hypertension   . Hyperlipidemia with target LDL  less than 70 with statin related symptoms   . Smoker   . History of percutaneous coronary intervention 12/28/2010  . Statin intolerance 12/05/2009  . 3-vessel CAD 02/22/2004  . S/P CABG x 4 01/30/2004    Past Surgical History:  Procedure Laterality Date  . CARDIAC CATHETERIZATION  May 01 2011   knowwn occlusion of SVG to RCA  as well as native RCA ; PCI to the SVG to OM1 ;patent LIMA to LAD with diffuse distal  LAD 70-80%,small  vessel dx not thought to be amenable to PCI .RCA 100% occluded ,OM2 patent,Circ diseased aftr OM1. EF 45-50%  . CORONARY ANGIOPLASTY WITH STENT PLACEMENT  12/25/2010   s/p autologous vessel blockage  . CORONARY ARTERY BYPASS GRAFT  02/27/04   LIMA -LAD, Free Radial-OM2 -- patent; SVG-OM1, SVG-RCA, - 100% occluded by recent cath  . ESOPHAGOGASTRODUODENOSCOPY    . LEFT HEART CATHETERIZATION WITH CORONARY/GRAFT ANGIOGRAM N/A 12/27/2012   Procedure: LEFT HEART CATHETERIZATION WITH Beatrix Fetters;  Surgeon: Leonie Man, MD;  Location: [facility] CATH LAB: Known nRCA, mLAD, OM1 & OM2 100%, Known SVG-RCA 100%, SVG-OM1 100% ISR. Patent LIMA-dLAD w/ 60-80% dLAD. Patent frRAD-OM2. LCx-RPL collaterals.   EF ~35%  . lower venous extremity doppler Left 08/15/2011   normal ,s/p vein ablation  . MET/CPET  11/02/2012   suboptimal effort but peak VO2  was 52% which relatively significant. Myoview was suggestive for ant. ischemia may go along with his distal LAD   . NM MYOCAR PERF WALL MOTION  12/15/2012   high risk study  . TRANSTHORACIC ECHOCARDIOGRAM  10/2012   mild LVH, EF 40%, mod posterior and inf wall hypokinesis,mild concentric LVH;; RV dilated, normal  fx.trace MR  . VEIN SURGERY  2011   solomon - h/o vericose veins       Home Medications    Prior to Admission medications   Medication Sig Start Date End Date Taking? Authorizing Provider  albuterol (PROVENTIL HFA;VENTOLIN HFA) 108 (90 BASE) MCG/ACT inhaler Inhale 2 puffs into the lungs every 4 (four) hours as  needed for wheezing or shortness of breath. 11/24/15  Yes Mabe, Shanon Brow, NP  ALPRAZolam Duanne Moron) 0.25 MG tablet Take 1 tablet (0.25 mg total) by mouth 2 (two) times daily as needed for anxiety. 10/16/17  Yes Ria Bush, MD  co-enzyme Q-10 30 MG capsule Take 1 capsule (30 mg total) by mouth daily. Patient taking differently: Take 30 mg by mouth 2 (two) times daily.  04/29/17  Yes Ria Bush, MD  COLCRYS 0.6 MG tablet TAKE AS DIRECTED 2 TABLETS BY MOUTH ON FIRST DAY THEN ONE DAILY AS NEEDED 10/16/17  Yes Ria Bush, MD  fluorouracil (EFUDEX) 5 % cream Apply topically 2 (two) times daily. 11/07/14  Yes Hyatt, Max T, DPM  fluticasone (FLONASE) 50 MCG/ACT nasal spray Place 2 sprays into both nostrils daily. Patient taking differently: Place 2 sprays into both nostrils as needed for allergies.  04/29/17  Yes Ria Bush, MD  ibuprofen (ADVIL,MOTRIN) 200 MG tablet Take 200 mg by mouth as needed for  moderate pain.   Yes [provider]  lisinopril (PRINIVIL,ZESTRIL) 2.5 MG tablet Take 1 tablet (2.5 mg total) by mouth daily. 06/16/17 03/02/18 Yes Leonie Man, MD  nitroGLYCERIN (NITROSTAT) 0.4 MG SL tablet Place 1 tablet (0.4 mg total) under the tongue every 5 (five) minutes as needed for chest pain. 10/13/16  Yes Ria Bush, MD  omeprazole (PRILOSEC OTC) 20 MG tablet Take 20 mg by mouth daily.   Yes [provider]  prasugrel (EFFIENT) 10 MG TABS tablet TAKE 1 TABLET EVERY DAY 09/30/17  Yes Leonie Man, MD  pravastatin (PRAVACHOL) 40 MG tablet Take 1 tablet (40 mg total) by mouth daily. Patient taking differently: Take 40 mg by mouth every other day. Pt states he doesn't take every day 12/19/16  Yes Ria Bush, MD  ranolazine (RANEXA) 1000 MG SR tablet Take 1 tablet (1,000 mg total) by mouth twice daily, as directed. 09/03/17  Yes Leonie Man, MD  vitamin B-12 (CYANOCOBALAMIN) 500 MCG tablet Take 500 mcg by mouth daily.   Yes [provider]  bisoprolol (ZEBETA) 5 MG tablet Take 0.5 tablets (2.5 mg total) by mouth daily. Patient not taking: Reported on 03/02/2018 09/03/17   Leonie Man, MD  diclofenac sodium (VOLTAREN) 1 % GEL Apply 2 g topically 3 (three) times daily. 12/01/17   Ria Bush, MD    Family History Family History  Problem Relation Age of Onset  . Diabetes Paternal Aunt   . Coronary artery disease Paternal Uncle   . Stroke Maternal Uncle   . Cancer Neg Hx     Social History Social History   Tobacco Use  . Smoking status: Former Smoker    Packs/day: 0.50    Years: 20.00    Pack years: 10.00    Types: Cigarettes    Last attempt to quit: 04/28/2012    Years since quitting: 5.8  . Smokeless tobacco: Never Used  . Tobacco comment: long time smoker, smokes occasional -anxiety  Substance Use Topics  . Alcohol use: Yes    Alcohol/week: 0.0 oz    Comment: occasionally  . Drug use: No     Allergies   Statins; Zetia [ezetimibe]; Clopidogrel; and Codeine   Review of Systems Review of Systems  Constitutional: Negative for fever.  HENT: Negative for congestion.   Eyes: Negative for redness.  Respiratory: Positive for cough and shortness of breath.   Cardiovascular: Positive for chest pain. Negative for leg swelling.  Gastrointestinal: Negative for abdominal pain.  Genitourinary: Negative for dysuria.  Musculoskeletal: Negative for back pain.  Skin: Negative for rash.  Neurological: Negative for headaches.  Hematological: Does not bruise/bleed easily.  Psychiatric/Behavioral: Negative for confusion.     Physical Exam Updated Vital Signs BP (!) 144/98   Pulse 97   Temp (!) 97.4 F (36.3 C) (Oral)   Resp 14   Ht 1.88 m (6' 2" )   Wt 108.9 kg (240 lb)   SpO2 97%   BMI 30.81 kg/m   Physical Exam  Constitutional: He is oriented to person, place, and time. He appears well-developed and well-nourished. No distress.  HENT:  Head: Normocephalic and atraumatic.  Mouth/Throat:  Oropharynx is clear and moist.  Eyes: Conjunctivae and EOM are normal. Pupils are equal, round, and reactive to light.  Neck: Neck supple.  Cardiovascular: Normal rate and regular rhythm.  Pulmonary/Chest: Effort normal and breath sounds normal. No respiratory distress.  Abdominal: Soft. Bowel sounds are normal. There is no tenderness.  Musculoskeletal: Normal  range of motion.  Neurological: He is alert and oriented to person, place, and time. No cranial nerve deficit or sensory deficit. He exhibits normal muscle tone. Coordination normal.  Nursing note and vitals reviewed.    ED Treatments / Results  Labs (all labs ordered are listed, but only abnormal results are displayed) Labs Reviewed  TROPONIN I - Abnormal; Notable for the following components:      Result Value   Troponin I 0.20 (*)    All other components within normal limits  COMPREHENSIVE METABOLIC PANEL - Abnormal; Notable for the following components:   Glucose, Bld 100 (*)    All other components within normal limits  CBC WITH DIFFERENTIAL/PLATELET - Abnormal; Notable for the following components:   Hemoglobin 12.3 (*)    HCT 37.1 (*)    All other components within normal limits  HEPARIN LEVEL (UNFRACTIONATED)  HEPARIN LEVEL (UNFRACTIONATED)  CBC    EKG  EKG Interpretation  Date/Time:  Tuesday March 02 2018 08:06:46 EST Ventricular Rate:  84 PR Interval:    QRS Duration: 116 QT Interval:  408 QTC Calculation: 483 R Axis:   101 Text Interpretation:  Sinus rhythm Low voltage with right axis deviation Consider left ventricular hypertrophy ST elevation, consider anterior injury Borderline prolonged QT interval Interpretation limited secondary to artifact Confirmed by Fredia Sorrow [id number]) on 03/02/2018 9:06:46 AM       Radiology Dg Chest 2 View  Result Date: 03/02/2018 CLINICAL DATA:  Shortness of breath with intermittent chest pain. EXAM: CHEST  2 VIEW COMPARISON:  12/27/2017 FINDINGS: Sternotomy wires  unchanged. Lordotic technique is demonstrated. Lungs are adequately inflated without focal airspace consolidation or effusion. Stable cardiomegaly. Remainder of the exam is unchanged. IMPRESSION: No acute cardiopulmonary disease. Stable cardiomegaly. Electronically Signed   By: Marin Olp M.D.   On: 11-04-2018 08:38   Ct Angio Chest Pe W/cm &/or Wo Cm  Result Date: 03/02/2018 CLINICAL DATA:  Shortness of breath and chest pain. Known coronary artery disease. PE suspected, high pretest probability. EXAM: CT ANGIOGRAPHY CHEST WITH CONTRAST TECHNIQUE: Multidetector CT imaging of the chest was performed using the standard protocol during bolus administration of intravenous contrast. Multiplanar CT image reconstructions and MIPs were obtained to evaluate the vascular anatomy. CONTRAST:  122m ISOVUE-370 IOPAMIDOL (ISOVUE-370) INJECTION 76% COMPARISON:  Two-view chest x-ray 11-04-2018 FINDINGS: Cardiovascular: Heart is enlarged. Coronary artery calcifications are present. Stents are in place. The patient is status post median sternotomy for CABG. Atherosclerotic calcifications are present at the aortic arch. Pulmonary artery opacification is excellent. The no focal filling defects are present to suggest pulmonary emboli. Mediastinum/Nodes: Subcentimeter right paratracheal lymph nodes are present. Prevascular nodes measure up to 11 mm. No significant hilar or axillary adenopathy is present. Lungs/Pleura: Patchy ground-glass airspace disease is present in the lower lobes bilaterally, left greater than right. There is some dependent atelectasis. Interlobular septal thickening is present. Small effusions are noted bilaterally. Upper Abdomen: Limited imaging of the upper abdomen is unremarkable. Musculoskeletal: Mild endplate changes are present at T10-11 and T11-12. Vertebral body heights and alignment are normal. No focal lytic or blastic lesions are present. Ribs are within normal limits bilaterally. Review of the MIP  images confirms the above findings. IMPRESSION: 1. No pulmonary embolus. 2. Cardiomegaly with interstitial edema and small effusions compatible with congestive heart failure. 3. Patchy ground-glass airspace opacities in the lower lobes bilaterally consistent with multi lobar pneumonia. Electronically Signed   By: CSan MorelleM.D.   On: 11-04-2018 13:07  Procedures Procedures (including critical care time)  CRITICAL CARE Performed by: Verlia Kaney Total critical care time: 30 minutes Critical care time was exclusive of separately billable procedures and treating other patients. Critical care was necessary to treat or prevent imminent or life-threatening deterioration. Critical care was time spent personally by me on the following activities: development of treatment plan with patient and/or surrogate as well as nursing, discussions with consultants, evaluation of patient's response to treatment, examination of patient, obtaining history from patient or surrogate, ordering and performing treatments and interventions, ordering and review of laboratory studies, ordering and review of radiographic studies, pulse oximetry and re-evaluation of patient's condition.   Medications Ordered in ED Medications  heparin ADULT infusion 100 units/mL (25000 units/255m sodium chloride 0.45%) (1,500 Units/hr Intravenous New Bag/Given 03/02/18 1134)  bisoprolol (ZEBETA) tablet 5 mg (not administered)  aspirin chewable tablet 324 mg (not administered)    Or  aspirin suppository 300 mg (not administered)  amLODipine (NORVASC) tablet 5 mg (not administered)  heparin bolus via infusion 4,000 Units (4,000 Units Intravenous Bolus from Bag 03/02/18 1134)  iopamidol (ISOVUE-370) 76 % injection (100 mLs  Contrast Given 03/02/18 1231)  cefTRIAXone (ROCEPHIN) 1 g in sodium chloride 0.9 % 100 mL IVPB (0 g Intravenous Stopped 03/02/18 1532)  azithromycin (ZITHROMAX) 500 mg in sodium chloride 0.9 % 250 mL IVPB (500 mg  Intravenous New Bag/Given 03/02/18 1532)     Initial Impression / Assessment and Plan / ED Course  I have reviewed the triage vital signs and the nursing notes.  Pertinent labs & imaging results that were available during my care of the patient were reviewed by me and considered in my medical decision making (see chart for details).     Patient's workup significant for a troponin that was elevated at 0.2.  Chest x-ray negative but due to the shortness of breath CT angios was done no evidence of pulmonary embolus.  However did show evidence of bilateral lower lobe pneumonia.  Patient had a little bit of a cough of kind of a whitish frothy sputum.  Chest x-ray also negative for pulmonary edema but CT angios chest did show some evidence of pulmonary edema and small pleural effusions.  Based on the elevated troponin patient started on heparin.  Based on the possible pneumonia which could be a community-acquired pneumonia patient started on Rocephin and Zithromax.  Clinically not sure whether there really is a pneumonia.  White count not elevated.  However patient felt that maybe he could have pneumonia.  No history of fevers.  Started on the heparin for possible non-STEMI.  Seen by cardiology they will admit.  Final Clinical Impressions(s) / ED Diagnoses   Final diagnoses:  SOB (shortness of breath)  Congestive heart failure, unspecified HF chronicity, unspecified heart failure type [place]  Community acquired pneumonia, unspecified laterality  Non-STEMI (non-ST elevated myocardial infarction) ([facility]    ED Discharge Orders    None       ZFredia Sorrow MD 03/02/18 1701

## 2018-03-02 NOTE — Progress Notes (Signed)
ANTICOAGULATION CONSULT NOTE - Initial Consult  Pharmacy Consult for hepar Indication: chest pain/ACS  Allergies  Allergen Reactions  . Statins   . Zetia [Ezetimibe]   . Clopidogrel Other (See Comments)    Disoriented   . Codeine Rash   Patient Measurements: Height: 6\' 2"  (188 cm) Weight: 241 lb 6.4 oz (109.5 kg) IBW/kg (Calculated) : 82.2 Heparin Dosing Weight: 104 Kg  Vital Signs: Temp: 98.2 F (36.8 C) (03/05 2000) Temp Source: Oral (03/05 2000) BP: 125/86 (03/05 2000) Pulse Rate: 94 (03/05 2000)  Labs: Recent Labs    03/02/18 0804 03/02/18 2017  HGB 12.3*  --   HCT 37.1*  --   PLT 196  --   HEPARINUNFRC  --  0.24*  CREATININE 1.14  --   TROPONINI 0.20*  --     Estimated Creatinine Clearance: 96.4 mL/min (by C-G formula based on SCr of 1.14 mg/dL).   Assessment: 56yoM present with chest pain; HX of cardiac stent placement and triple bypass surgery; prasugrel PTA, but no reported anticoagulation. HgB 12.3   Initial hep lvl low at 0.24  Goal of Therapy:  Heparin level 0.3-0.7 units/ml Monitor platelets by anticoagulation protocol: Yes   Plan:  Give 1500 units bolus x 1 Increase heparin infusion to 1650 units/hr Next lvl am labs  Isaac BlissMichael Valerye Kobus, PharmD, BCPS, BCCCP Clinical Pharmacist Clinical phone for 03/02/2018 from 1430 [phone]: [phone] If after 2300, please call main pharmacy at: [phone] 03/02/2018 9:56 PM

## 2018-03-02 NOTE — H&P (Addendum)
Cardiology H&P    Patient ID: Calvin Francis MRN: [id number], DOB/AGE: 05-29-1961   Admit date: 03/02/2018 Date of Consult: 03/02/2018  Primary Physician: Calvin Bush, MD Primary Cardiologist: Dr. Ellyn Francis Requesting Provider: Dr. Rogene Francis Reason for Consultation: Chest pain  Calvin Francis is a 57 y.o. male who is being seen today for the evaluation of chest pain at the request of Dr. Rogene Francis.   Patient Profile    57 yo male with PMH of CAD s/p CABG 4v ('05) now with 2/4 patent grafts, ICM, HTN, HL who presented with cough and chest pain.   Past Medical History   Past Medical History:  Diagnosis Date  . 3-vessel CAD 02/22/2004   mid RCA 100% occluded, L-R collaterals; LAD 60-70% bifurcation lesion; Cx proximal 70% and mid 80% after OM1, OM1 100% occluded. --> CABG x 4  . Barrett's esophagus    s/p EGD several years ago with [place]  . Bronchitis, mucopurulent recurrent ([place])   . CAD (coronary artery disease) of bypass graft 5/'12; 12/'13   Cath for angina and Abn Myoview ST: SVG-OM1 now occluded, attempt at PCI to the native circumflex OM1 unsuccessful; distal LAD beyond patent LIMA 70-80% (not PCI amenable); free radial-OM 2 patent; EF 45-50%  . Erectile dysfunction   . Former heavy cigarette smoker (20-39 per day) May 2013   . GERD (gastroesophageal reflux disease)    ?barrett's, with esophageal dysmotility, on omeprazole  . Glucose intolerance (impaired glucose tolerance)  2009   Prediabetes  . Gout   . History of Non-STEMI (non-ST elevated myocardial infarction) 02/22/2004   Three-vessel disease --> referred for CABG  . History of Non-STEMI (non-ST elevated myocardial infarction) December 2011   Hazy lesion in SVG-OM1 --> staged PCI with 4.0 mm x 20 mm vision BMS (4.5 mm)  . HLD (hyperlipidemia)    Statin intolerant  . Hypertension, benign   . Ischemic cardiomyopathy Echo 10/2012   EF ~40%; moderate Posterior HypoKinesis, minld-moderate inferior Hypokinesis;  mild RV dilation, mild concentric LVH  . 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 Stable angina ([place]) 11/2012   Chronic,some what stable but still present; cardiac cath results above, no significant change from 2012  . Testosterone deficiency    On replacement; goal is low normal.  . Varicose veins December 2013   with venous reflux status VNUS ablation of left greater saphenous wein    Past Surgical History:  Procedure Laterality Date  . CARDIAC CATHETERIZATION  May 01 2011   knowwn occlusion of SVG to RCA  as well as native RCA ; PCI to the SVG to OM1 ;patent LIMA to LAD with diffuse distal  LAD 70-80%,small  vessel dx not thought to be amenable to PCI .RCA 100% occluded ,OM2 patent,Circ diseased aftr OM1. EF 45-50%  . CORONARY ANGIOPLASTY WITH STENT PLACEMENT  12/25/2010   s/p autologous vessel blockage  . CORONARY ARTERY BYPASS GRAFT  02/27/04   LIMA -LAD, Free Radial-OM2 -- patent; SVG-OM1, SVG-RCA, - 100% occluded by recent cath  . ESOPHAGOGASTRODUODENOSCOPY    . LEFT HEART CATHETERIZATION WITH CORONARY/GRAFT ANGIOGRAM N/A 12/27/2012   Procedure: LEFT HEART CATHETERIZATION WITH Calvin Francis;  Surgeon: Calvin Man, MD;  Location: [facility] CATH LAB: Known nRCA, mLAD, OM1 & OM2 100%, Known SVG-RCA 100%, SVG-OM1 100% ISR. Patent LIMA-dLAD w/ 60-80% dLAD. Patent frRAD-OM2. LCx-RPL collaterals.   EF ~35%  . lower venous extremity doppler Left 08/15/2011  normal ,s/p vein ablation  . MET/CPET  11/02/2012   suboptimal effort but peak VO2  was 52% which relatively significant. Myoview was suggestive for ant. ischemia may go along with his distal LAD   . NM MYOCAR PERF WALL MOTION  12/15/2012   high risk study  . TRANSTHORACIC ECHOCARDIOGRAM  10/2012   mild LVH, EF 40%, mod posterior and inf wall hypokinesis,mild concentric LVH;; RV dilated, normal  fx.trace MR  . VEIN SURGERY  2011   Calvin Francis - h/o vericose veins     Allergies  Allergies    Allergen Reactions  . Statins   . Zetia [Ezetimibe]   . Clopidogrel Other (See Comments)    Disoriented   . Codeine Rash    History of Present Illness    [name] is a 57 yo male with PMH of CAD s/p CABG in '05. Last cath was in 2013 and noted patent LIMA-LAD, and graft to OM2 with occluded SVG-OM1 and SVG-RCA. Echo from 11/13 showed EF of 40% with mod posterior and inf wall hypokinesis with LVH. His lipids have been difficult to control as he has been intolerant to statins with myalgias. Attempted PCSK9s but to costly in the past. He was last seen in the office on 9/18 and reported ongoing anginal pain more so than usual. Felt his activity level was declining. Not able to do much activity in very hot or cold weather and having increasing fatigue. His Ranexa was increased and added low dose Bisoprolol as he had been intolerant to BB therapy in the past 2/2 to fatigue. He had been on pravastatin as an outpatient but last LCL in 2/18 showed LDL 107 and Trig 251.   Reports having a bad episode of chest pain back in December when the snow storm came through. Was outside pushing snow off a dog house and had severe chest pain with shortness of breath. Symptoms lingered over several days but he did not come to the ED for this. Had a URI around this same time.   Over the past week his breathing has been worse. Can barely walk a couple of feet before becoming significantly out of breath. Last Friday night symptoms seemed to worsen. Laid down that night and was drifting off to sleep when his breathing became labored and he developed chest tightness. The breathing issues continued over the next several days and he presented to the ED.  In the ED his labs showed stable electrolytes, Cr 1.14, Trop 0.20, Hgb 12.3. CXR was negative but CTA showed concern for multi lobe PNA. He denies any fever, but has had a productive cough over the past couple of days. States he has continued to struggle with chest pain on a  regular basis at home, but this was more so related to his breathing. No chest pain at the time of assessment.   Inpatient Medications      Family History    Family History  Problem Relation Age of Onset  . Diabetes Paternal Aunt   . Coronary artery disease Paternal Uncle   . Stroke Maternal Uncle   . Cancer Neg Hx     Social History    Social History   Socioeconomic History  . Marital status: Married    Spouse name: Not on file  . Number of children: Not on file  . Years of education: Not on file  . Highest education level: Not on file  Social Needs  . Financial resource strain: Not on file  .  Food insecurity - worry: Not on file  . Food insecurity - inability: Not on file  . Transportation needs - medical: Not on file  . Transportation needs - non-medical: Not on file  Occupational History  . Not on file  Tobacco Use  . Smoking status: Former Smoker    Packs/day: 0.50    Years: 20.00    Pack years: 10.00    Types: Cigarettes    Last attempt to quit: 04/28/2012    Years since quitting: 5.8  . Smokeless tobacco: Never Used  . Tobacco comment: long time smoker, smokes occasional -anxiety  Substance and Sexual Activity  . Alcohol use: Yes    Alcohol/week: 0.0 oz    Comment: occasionally  . Drug use: No  . Sexual activity: Yes  Other Topics Concern  . Not on file  Social History Narrative   Caffeine: 1 cup coffee in am   Lives with wife, 2 dogs   Married, 2 Grown children, 3 grandchildren   Occupation: disability from cardiac status for last year, prior worked for [employer]   Activity: walks driveway, limited by chest pain/SOB    Diet: good water, fruits/vegetables daily     Review of Systems    See HPI  All other systems reviewed and are otherwise negative except as noted above.  Physical Exam    Blood pressure (!) 142/89, pulse 91, temperature (!) 97.4 F (36.3 C), temperature source Oral, resp. rate 15, height _0  (1.88 m), weight 240 lb  (108.9 kg), SpO2 94 %.  General: Pleasant, NAD Psych: Normal affect. Neuro: Alert and oriented X 3. Moves all extremities spontaneously. HEENT: Normal  Neck: Supple without bruits or JVD. Lungs:  Resp regular and unlabored, CTA. Heart: RRR no s3, s4, or murmurs. Abdomen: Soft, non-tender, non-distended, BS + x 4.  Extremities: No clubbing, cyanosis or edema. DP/PT/Radials 2+ and equal bilaterally.  Labs    Troponin (Point of Care Test) No results for input(s): TROPIPOC in the last 72 hours. Recent Labs    03/02/18 0804  TROPONINI 0.20*   Lab Results  Component Value Date   WBC 5.9 2020/12/3017   HGB 12.3 (L) 2020/12/3017   HCT 37.1 (L) 2020/12/3017   MCV 84.7 2020/12/3017   PLT 196 2020/12/3017    Recent Labs  Lab 03/02/18 0804  NA 138  K 4.3  CL 106  CO2 23  BUN 8  CREATININE 1.14  CALCIUM 9.1  PROT 6.9  BILITOT 0.6  ALKPHOS 58  ALT 19  AST 21  GLUCOSE 100*   Lab Results  Component Value Date   CHOL 191 10/13/2017   HDL 35 (L) 10/13/2017   LDLCALC 107 (H) 02/03/2017   TRIG 219 (H) 10/13/2017   No results found for: [facility]   Radiology Studies    Dg Chest 2 View  Result Date: 03/02/2018 CLINICAL DATA:  Shortness of breath with intermittent chest pain. EXAM: CHEST  2 VIEW COMPARISON:  12/27/2017 FINDINGS: Sternotomy wires unchanged. Lordotic technique is demonstrated. Lungs are adequately inflated without focal airspace consolidation or effusion. Stable cardiomegaly. Remainder of the exam is unchanged. IMPRESSION: No acute cardiopulmonary disease. Stable cardiomegaly. Electronically Signed   By: Marin Olp M.D.   On: 2020/12/3017 08:38   Ct Angio Chest Pe W/cm &/or Wo Cm  Result Date: 03/02/2018 CLINICAL DATA:  Shortness of breath and chest pain. Known coronary artery disease. PE suspected, high pretest probability. EXAM: CT ANGIOGRAPHY CHEST WITH CONTRAST TECHNIQUE: Multidetector CT imaging of the  chest was performed using the standard protocol during bolus  administration of intravenous contrast. Multiplanar CT image reconstructions and MIPs were obtained to evaluate the vascular anatomy. CONTRAST:  164m ISOVUE-370 IOPAMIDOL (ISOVUE-370) INJECTION 76% COMPARISON:  Two-view chest x-ray 2020/05/118 FINDINGS: Cardiovascular: Heart is enlarged. Coronary artery calcifications are present. Stents are in place. The patient is status post median sternotomy for CABG. Atherosclerotic calcifications are present at the aortic arch. Pulmonary artery opacification is excellent. The no focal filling defects are present to suggest pulmonary emboli. Mediastinum/Nodes: Subcentimeter right paratracheal lymph nodes are present. Prevascular nodes measure up to 11 mm. No significant hilar or axillary adenopathy is present. Lungs/Pleura: Patchy ground-glass airspace disease is present in the lower lobes bilaterally, left greater than right. There is some dependent atelectasis. Interlobular septal thickening is present. Small effusions are noted bilaterally. Upper Abdomen: Limited imaging of the upper abdomen is unremarkable. Musculoskeletal: Mild endplate changes are present at T10-11 and T11-12. Vertebral body heights and alignment are normal. No focal lytic or blastic lesions are present. Ribs are within normal limits bilaterally. Review of the MIP images confirms the above findings. IMPRESSION: 1. No pulmonary embolus. 2. Cardiomegaly with interstitial edema and small effusions compatible with congestive heart failure. 3. Patchy ground-glass airspace opacities in the lower lobes bilaterally consistent with multi lobar pneumonia. Electronically Signed   By: CSan MorelleM.D.   On: 2020/05/118 13:07    ECG & Cardiac Imaging    EKG:  The EKG was personally reviewed and demonstrates SR with no acute ischemia.   Echo: 2013  40%  Assessment & Plan   57yo male with PMH of CAD s/p CABG 4v ('05) now with 2/4 patent grafts, ICM, HTN, HL who presented with cough and chest pain.     1. Dyspnea/Chest pain: struggles with chronic chest pain. Has been on Ranexa and bisoprolol 2.539mdaily as outpatient. Last cath in 2013 with 2/4 patent grafts. Per Dr. HaDarcus Pesterotes could consider relook cath in the future if continues to have recurrent symptoms. Suspect some of this could be 2/2 to PNA noted on CTA.  -- cycle troponins -- IV heparin -- check echo -- increase bisoprolol to 12m41maily and add amlodipine for bp and antianginal effects -- concern for sleep apnea  2. Multilobe PNA?: noted on CTA, though no reported fevers. Semi-productive cough prior to admission.  -- follow CBC, check sputum culture   2. ICM: EF 40% on echo 2013. No over signs of heart failure on exam -- check echo -- continue BB and ACEi  3. HTN: Blood pressures elevated in the ED. Recommendations noted above  4. HL: has been on pravastatin 37m44mily 2/2 to intolerance with myalgias. Unable to afford PCSK9s in the past but likely an option now with changes in coverage.  -- add Zetia   Signed, LindReino Bellis-C Pager [phone]/2019, 2:17 PM    Patient seen and examined. Agree with assessment and plan.  Calvin Francis 56 y38r old gentleman who has a long-standing history of CAD and underwent CABG revascularization surgery at age 28. 29e has been documented to have 2 or 4 grafts occluded and as of 2013 had an ejection fraction of 40%.  The patient apparently reports issues with statin intolerance and his only been maintained on pravastatin 40 mg.  His most recent lipid studies from 10/13/2017 are abnormal with LDL-cholesterol 122, triglycerides 219, HDL 35, total cholesterol 191.  The patient admits to almost daily episodes of chronic anginal symptomatology.  Review of his medications revealed that he is only on minimal therapy including bisoprolol 2.5 mg and Ranexa 1000 mg twice a day.  He is not well beta blocked in.  In the ER.  His resting pulse is 100 and blood pressure 146/90.   Presently, he is chest pain-free.  He has a thick neck.  No body skeletal 3.  This elongated uvula.  Lungs were clear.  Rhythm was regular.  There was no chest wall tenderness.  There was a faint 1/6 systolic murmur.  Abdomen soft and nontender.  There was a soft right femoral bruit.  Distal pulses were 2+.  He did not have muscle tenderness to palpation.  There was no significant edema.  Neurologic exam exam was grossly nonfocal.  His ECG is normal sinus rhythm at 84.  There is incomplete left bundle branch block.  There is mild QTC prolongation at 43.  There are no acute ST-T changes.  Initial laboratory feels normal renal function.  LFTs were normal.  Hemoglobin and hematocrit are stable. Initial troponin is minimally elevated 0.20.  At this point, I have recommended titration of medical therapy with increasing bisoprolol to 5 mg twice a day and ultimately more if tolerated, initiate amlodipine 5 mg, which will also be beneficial for blood pressure as well as ischemia.  I would add Zetia 10 mg to his current dose of pravastatin 40 mg.  I had a lengthy discussion with him regarding PCSK9 inhibition and discussed a four-year trial at length, which when compared to aggressively treated statin patients versus patients with statin plus Repatha and reduction of baseline LDL from 92, to a median of 26 of which 47% had LDLs less than 25 on Repatha, this leading to a 27% reduction in myocardial infarction and 21% reduction in stroke in only a 2.2 year period.  I discussed with him the recent significant reduction in the pricing of treatment such that he may now be able to afford this optimal therapy.  He also has a history highly suggestive of obstructive sleep apnea.  Upon further questioning he snores loudly, he wakes up gasping for breath.  His sleep is nonrestorative.  He eventually will require an outpatient sleep study for further assessment.  We will monitor his cardiac enzymes and ECG.  At his last catheterization  he had diffuse disease beyond his grafts.  If he develops increasing recurrent chest pain despite increase medical therapy, repeat catheterization will be necessary.  Of note, although his x-ray raised the possibility of pneumonia, the patient denies any fever, coughs, night sweats, or purulent sputum.  Follow-up CBC and check sputum.   Troy Sine, MD, [facility] 03/02/2018 3:28 PM

## 2018-03-02 NOTE — Progress Notes (Signed)
ANTICOAGULATION CONSULT NOTE - Initial Consult  Pharmacy Consult for hepar Indication: chest pain/ACS  Allergies  Allergen Reactions  . Statins   . Zetia [Ezetimibe]   . Clopidogrel Other (See Comments)    Disoriented   . Codeine Rash   Patient Measurements: Height: 6\' 2"  (188 cm) Weight: 240 lb (108.9 kg) IBW/kg (Calculated) : 82.2 Heparin Dosing Weight: 104 Kg  Vital Signs: Temp: 97.4 F (36.3 C) (03/05 0730) Temp Source: Oral (03/05 0730) BP: 130/96 (03/05 1105) Pulse Rate: 83 (03/05 1105)  Labs: Recent Labs    03/02/18 0804  HGB 12.3*  HCT 37.1*  PLT 196  CREATININE 1.14  TROPONINI 0.20*    Estimated Creatinine Clearance: 96.2 mL/min (by C-G formula based on SCr of 1.14 mg/dL).   Medical History: Past Medical History:  Diagnosis Date  . 3-vessel CAD 02/22/2004   mid RCA 100% occluded, L-R collaterals; LAD 60-70% bifurcation lesion; Cx proximal 70% and mid 80% after OM1, OM1 100% occluded. --> CABG x 4  . Barrett's esophagus    s/p EGD several years ago with [place]  . Bronchitis, mucopurulent recurrent ([facility])   . CAD (coronary artery disease) of bypass graft 5/'12; 12/'13   Cath for angina and Abn Myoview ST: SVG-OM1 now occluded, attempt at PCI to the native circumflex OM1 unsuccessful; distal LAD beyond patent LIMA 70-80% (not PCI amenable); free radial-OM 2 patent; EF 45-50%  . Erectile dysfunction   . Former heavy cigarette smoker (20-39 per day) May 2013   . GERD (gastroesophageal reflux disease)    ?barrett's, with esophageal dysmotility, on omeprazole  . Glucose intolerance (impaired glucose tolerance)  2009   Prediabetes  . Gout   . History of Non-STEMI (non-ST elevated myocardial infarction) 02/22/2004   Three-vessel disease --> referred for CABG  . History of Non-STEMI (non-ST elevated myocardial infarction) December 2011   Hazy lesion in SVG-OM1 --> staged PCI with 4.0 mm x 20 mm vision BMS (4.5 mm)  . HLD (hyperlipidemia)    Statin  intolerant  . Hypertension, benign   . Ischemic cardiomyopathy Echo 10/2012   EF ~40%; moderate Posterior HypoKinesis, minld-moderate inferior Hypokinesis; mild RV dilation, mild concentric LVH  . 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 Stable angina ([facility]) 11/2012   Chronic,some what stable but still present; cardiac cath results above, no significant change from 2012  . Testosterone deficiency    On replacement; goal is low normal.  . Varicose veins December 2013   with venous reflux status VNUS ablation of left greater saphenous wein    Assessment: 56yoM present with chest pain; HX of cardiac stent placement and triple bypass surgery; prasugrel PTA, but no reported anticoagulation. HgB 12.3 Pharmacy consulted to start heparin infusion.  Goal of Therapy:  Heparin level 0.3-0.7 units/ml Monitor platelets by anticoagulation protocol: Yes   Plan:  Give 4000 units bolus x 1 Start heparin infusion at 1500 units/hr Check anti-Xa level in 6 hours and daily while on heparin Continue to monitor H&H and platelets  Calvin Francis 03/02/2018,11:23 AM

## 2018-03-03 ENCOUNTER — Observation Stay (HOSPITAL_COMMUNITY): Payer: Medicare HMO

## 2018-03-03 ENCOUNTER — Other Ambulatory Visit: Payer: Self-pay

## 2018-03-03 DIAGNOSIS — I519 Heart disease, unspecified: Secondary | ICD-10-CM | POA: Diagnosis not present

## 2018-03-03 DIAGNOSIS — G4733 Obstructive sleep apnea (adult) (pediatric): Secondary | ICD-10-CM | POA: Diagnosis present

## 2018-03-03 DIAGNOSIS — I252 Old myocardial infarction: Secondary | ICD-10-CM | POA: Diagnosis not present

## 2018-03-03 DIAGNOSIS — I259 Chronic ischemic heart disease, unspecified: Secondary | ICD-10-CM

## 2018-03-03 DIAGNOSIS — I25119 Atherosclerotic heart disease of native coronary artery with unspecified angina pectoris: Secondary | ICD-10-CM | POA: Diagnosis not present

## 2018-03-03 DIAGNOSIS — K219 Gastro-esophageal reflux disease without esophagitis: Secondary | ICD-10-CM | POA: Diagnosis present

## 2018-03-03 DIAGNOSIS — Z6830 Body mass index (BMI) 30.0-30.9, adult: Secondary | ICD-10-CM | POA: Diagnosis not present

## 2018-03-03 DIAGNOSIS — I5023 Acute on chronic systolic (congestive) heart failure: Secondary | ICD-10-CM | POA: Diagnosis present

## 2018-03-03 DIAGNOSIS — Z79899 Other long term (current) drug therapy: Secondary | ICD-10-CM | POA: Diagnosis not present

## 2018-03-03 DIAGNOSIS — N183 Chronic kidney disease, stage 3 (moderate): Secondary | ICD-10-CM | POA: Diagnosis present

## 2018-03-03 DIAGNOSIS — I214 Non-ST elevation (NSTEMI) myocardial infarction: Secondary | ICD-10-CM | POA: Diagnosis not present

## 2018-03-03 DIAGNOSIS — Z885 Allergy status to narcotic agent status: Secondary | ICD-10-CM | POA: Diagnosis not present

## 2018-03-03 DIAGNOSIS — Z955 Presence of coronary angioplasty implant and graft: Secondary | ICD-10-CM | POA: Diagnosis not present

## 2018-03-03 DIAGNOSIS — I255 Ischemic cardiomyopathy: Secondary | ICD-10-CM | POA: Diagnosis present

## 2018-03-03 DIAGNOSIS — I130 Hypertensive heart and chronic kidney disease with heart failure and stage 1 through stage 4 chronic kidney disease, or unspecified chronic kidney disease: Secondary | ICD-10-CM | POA: Diagnosis present

## 2018-03-03 DIAGNOSIS — E785 Hyperlipidemia, unspecified: Secondary | ICD-10-CM | POA: Diagnosis present

## 2018-03-03 DIAGNOSIS — I2582 Chronic total occlusion of coronary artery: Secondary | ICD-10-CM | POA: Diagnosis present

## 2018-03-03 DIAGNOSIS — Z87891 Personal history of nicotine dependence: Secondary | ICD-10-CM | POA: Diagnosis not present

## 2018-03-03 DIAGNOSIS — I25708 Atherosclerosis of coronary artery bypass graft(s), unspecified, with other forms of angina pectoris: Secondary | ICD-10-CM | POA: Diagnosis not present

## 2018-03-03 DIAGNOSIS — I509 Heart failure, unspecified: Secondary | ICD-10-CM | POA: Diagnosis not present

## 2018-03-03 DIAGNOSIS — R748 Abnormal levels of other serum enzymes: Secondary | ICD-10-CM | POA: Diagnosis not present

## 2018-03-03 DIAGNOSIS — I340 Nonrheumatic mitral (valve) insufficiency: Secondary | ICD-10-CM

## 2018-03-03 DIAGNOSIS — I25118 Atherosclerotic heart disease of native coronary artery with other forms of angina pectoris: Secondary | ICD-10-CM | POA: Diagnosis present

## 2018-03-03 DIAGNOSIS — J181 Lobar pneumonia, unspecified organism: Secondary | ICD-10-CM | POA: Diagnosis present

## 2018-03-03 DIAGNOSIS — G8929 Other chronic pain: Secondary | ICD-10-CM | POA: Diagnosis present

## 2018-03-03 DIAGNOSIS — R0602 Shortness of breath: Secondary | ICD-10-CM | POA: Diagnosis present

## 2018-03-03 DIAGNOSIS — I25718 Atherosclerosis of autologous vein coronary artery bypass graft(s) with other forms of angina pectoris: Secondary | ICD-10-CM | POA: Diagnosis present

## 2018-03-03 DIAGNOSIS — Z888 Allergy status to other drugs, medicaments and biological substances status: Secondary | ICD-10-CM | POA: Diagnosis not present

## 2018-03-03 LAB — CBC
HCT: 35 % — ABNORMAL LOW (ref 39.0–52.0)
HEMOGLOBIN: 11.6 g/dL — AB (ref 13.0–17.0)
MCH: 27.7 pg (ref 26.0–34.0)
MCHC: 33.1 g/dL (ref 30.0–36.0)
MCV: 83.5 fL (ref 78.0–100.0)
PLATELETS: 191 10*3/uL (ref 150–400)
RBC: 4.19 MIL/uL — AB (ref 4.22–5.81)
RDW: 14 % (ref 11.5–15.5)
WBC: 8 10*3/uL (ref 4.0–10.5)

## 2018-03-03 LAB — INFLUENZA PANEL BY PCR (TYPE A & B)
INFLAPCR: NEGATIVE
INFLBPCR: NEGATIVE

## 2018-03-03 LAB — BASIC METABOLIC PANEL
Anion gap: 11 (ref 5–15)
BUN: 7 mg/dL (ref 6–20)
CALCIUM: 9.1 mg/dL (ref 8.9–10.3)
CHLORIDE: 103 mmol/L (ref 101–111)
CO2: 25 mmol/L (ref 22–32)
CREATININE: 1.25 mg/dL — AB (ref 0.61–1.24)
GFR calc non Af Amer: >60 mL/min (ref >60)
Glucose, Bld: 106 mg/dL — ABNORMAL HIGH (ref 65–99)
Potassium: 4.1 mmol/L (ref 3.5–5.1)
SODIUM: 139 mmol/L (ref 135–145)

## 2018-03-03 LAB — HEPARIN LEVEL (UNFRACTIONATED)
HEPARIN UNFRACTIONATED: 0.38 [IU]/mL (ref 0.30–0.70)
HEPARIN UNFRACTIONATED: 0.23 [IU]/mL — AB (ref 0.30–0.70)

## 2018-03-03 LAB — TROPONIN I
TROPONIN I: 0.18 ng/mL — AB (ref <0.03)
Troponin I: 0.13 ng/mL (ref <0.03)

## 2018-03-03 LAB — HIV ANTIBODY (ROUTINE TESTING W REFLEX): HIV Screen 4th Generation wRfx: NONREACTIVE

## 2018-03-03 LAB — LIPID PANEL
Cholesterol: 174 mg/dL (ref 0–200)
HDL: 33 mg/dL — ABNORMAL LOW (ref >40)
LDL CALC: 111 mg/dL — AB (ref 0–99)
TRIGLYCERIDES: 151 mg/dL — AB (ref <150)
Total CHOL/HDL Ratio: 5.3 RATIO
VLDL: 30 mg/dL (ref 0–40)

## 2018-03-03 LAB — ECHOCARDIOGRAM COMPLETE
HEIGHTINCHES: 74 in
Weight: 3856 oz

## 2018-03-03 MED ORDER — ALPRAZOLAM 0.25 MG PO TABS
0.2500 mg | ORAL_TABLET | Freq: Two times a day (BID) | ORAL | Status: DC | PRN
  Administered 2018-03-03 – 2018-03-06 (×5): 0.25 mg via ORAL
  Filled 2018-03-03 (×5): qty 1

## 2018-03-03 NOTE — Progress Notes (Signed)
2D Echocardiogram has been performed.  Pieter PartridgeBrooke S Creta Dorame 03/03/2018, 2:54 PM

## 2018-03-03 NOTE — Progress Notes (Addendum)
Progress Note  Patient Name: Calvin LangDavid W Whitsell Date of Encounter: 03/03/2018  Primary Cardiologist: Bryan Lemmaavid Harding, MD  Subjective   No further chest pain, had a lot of coughing yesterday afternoon. Breathing seems better but still having some difficulty when laying supine.  Inpatient Medications    Scheduled Meds: . amLODipine  5 mg Oral Daily  . aspirin EC  81 mg Oral Daily  . bisoprolol  5 mg Oral Daily  . ezetimibe  10 mg Oral Daily  . lisinopril  2.5 mg Oral Daily  . prasugrel  10 mg Oral Daily  . pravastatin  40 mg Oral Daily  . ranolazine  1,000 mg Oral BID   Continuous Infusions: . azithromycin    . cefTRIAXone (ROCEPHIN)  IV    . heparin 1,850 Units/hr (03/03/18 1006)   PRN Meds: acetaminophen, albuterol, ALPRAZolam, guaiFENesin-dextromethorphan, nitroGLYCERIN, ondansetron (ZOFRAN) IV   Vital Signs    Vitals:   03/02/18 1956 03/02/18 2000 03/03/18 0528 03/03/18 0958  BP:  125/86 (!) 124/91 123/90  Pulse:  94 78   Resp:   17   Temp:  98.2 F (36.8 C) 98.1 F (36.7 C)   TempSrc:  Oral Oral   SpO2:  99% 100%   Weight: 241 lb 6.4 oz (109.5 kg)  241 lb (109.3 kg)   Height: 6\' 2"  (1.88 m)       Intake/Output Summary (Last 24 hours) at 03/03/2018 1123 Last data filed at 03/03/2018 0900 Gross per 24 hour  Intake 1581.03 ml  Output -  Net 1581.03 ml   Filed Weights   03/02/18 0745 03/02/18 1956 03/03/18 0528  Weight: 240 lb (108.9 kg) 241 lb 6.4 oz (109.5 kg) 241 lb (109.3 kg)    Telemetry    SR - Personally Reviewed  ECG    N/a - Personally Reviewed  Physical Exam   General: Well developed, well nourished, male appearing in no acute distress. Head: Normocephalic, atraumatic.  Neck: Supple without bruits, JVD. Lungs:  Resp regular and unlabored, CTA. Heart: RRR, S1, S2, no S3, S4, or murmur; no rub. Abdomen: Soft, non-tender, non-distended with normoactive bowel sounds. No hepatomegaly. No rebound/guarding. No obvious abdominal masses. Extremities:  No clubbing, cyanosis, edema. Distal pedal pulses are 2+ bilaterally. Neuro: Alert and oriented X 3. Moves all extremities spontaneously. Psych: Normal affect.  Labs    Chemistry Recent Labs  Lab 03/02/18 0804 03/03/18 0841  NA 138 139  K 4.3 4.1  CL 106 103  CO2 23 25  GLUCOSE 100* 106*  BUN 8 7  CREATININE 1.14 1.25*  CALCIUM 9.1 9.1  PROT 6.9  --   ALBUMIN 4.1  --   AST 21  --   ALT 19  --   ALKPHOS 58  --   BILITOT 0.6  --   GFRNONAA >60 >60  GFRAA >60 >60  ANIONGAP 9 11     Hematology Recent Labs  Lab 03/02/18 0804 03/03/18 0215  WBC 5.9 8.0  RBC 4.38 4.19*  HGB 12.3* 11.6*  HCT 37.1* 35.0*  MCV 84.7 83.5  MCH 28.1 27.7  MCHC 33.2 33.1  RDW 14.1 14.0  PLT 196 191    Cardiac Enzymes Recent Labs  Lab 03/02/18 0804 03/02/18 2017 03/03/18 0215 03/03/18 0841  TROPONINI 0.20* 0.19* 0.18* 0.13*   No results for input(s): TROPIPOC in the last 168 hours.   BNPNo results for input(s): BNP, PROBNP in the last 168 hours.   DDimer No results for input(s): DDIMER in  the last 168 hours.    Radiology    Dg Chest 2 View  Result Date: 03/02/2018 CLINICAL DATA:  Shortness of breath with intermittent chest pain. EXAM: CHEST  2 VIEW COMPARISON:  12/27/2017 FINDINGS: Sternotomy wires unchanged. Lordotic technique is demonstrated. Lungs are adequately inflated without focal airspace consolidation or effusion. Stable cardiomegaly. Remainder of the exam is unchanged. IMPRESSION: No acute cardiopulmonary disease. Stable cardiomegaly. Electronically Signed   By: Elberta Fortis M.D.   On: 12-20-2018 08:38   Ct Angio Chest Pe W/cm &/or Wo Cm  Result Date: 03/02/2018 CLINICAL DATA:  Shortness of breath and chest pain. Known coronary artery disease. PE suspected, high pretest probability. EXAM: CT ANGIOGRAPHY CHEST WITH CONTRAST TECHNIQUE: Multidetector CT imaging of the chest was performed using the standard protocol during bolus administration of intravenous contrast.  Multiplanar CT image reconstructions and MIPs were obtained to evaluate the vascular anatomy. CONTRAST:  ISOVUE-370 IOPAMIDOL (ISOVUE-370) INJECTION 76% COMPARISON:  Two-view chest x-ray 12-20-2018 FINDINGS: Cardiovascular: Heart is enlarged. Coronary artery calcifications are present. Stents are in place. The patient is status post median sternotomy for CABG. Atherosclerotic calcifications are present at the aortic arch. Pulmonary artery opacification is excellent. The no focal filling defects are present to suggest pulmonary emboli. Mediastinum/Nodes: Subcentimeter right paratracheal lymph nodes are present. Prevascular nodes measure up to 11 mm. No significant hilar or axillary adenopathy is present. Lungs/Pleura: Patchy ground-glass airspace disease is present in the lower lobes bilaterally, left greater than right. There is some dependent atelectasis. Interlobular septal thickening is present. Small effusions are noted bilaterally. Upper Abdomen: Limited imaging of the upper abdomen is unremarkable. Musculoskeletal: Mild endplate changes are present at T10-11 and T11-12. Vertebral body heights and alignment are normal. No focal lytic or blastic lesions are present. Ribs are within normal limits bilaterally. Review of the MIP images confirms the above findings. IMPRESSION: 1. No pulmonary embolus. 2. Cardiomegaly with interstitial edema and small effusions compatible with congestive heart failure. 3. Patchy ground-glass airspace opacities in the lower lobes bilaterally consistent with multi lobar pneumonia. Electronically Signed   By: Marin Roberts M.D.   On: 12-20-2018 13:07    Cardiac Studies   TTE: pending  Patient Profile     57 y.o. male with PMH of CAD s/p CABG 4v ('05) now with 2/4 patent grafts, ICM, HTN, HL who presented with cough and chest pain.   Assessment & Plan    1. Dyspnea/Chest pain: Medical therapy was adjusted with bisoprolol increased to 5mg  daily and added  amlodipine 5mg . Trops have been flat non ACS trend. No chest pain this morning.  Will stop IV heparin -- echo pending -- needs work up for OSA as an outpatient.   2. Multilobe PNA?: noted on CTA, though no reported fevers. Semi-productive cough prior to admission. No elevated WBC -- sputum culture pending  2. ICM: EF 40% on echo 2013. No over signs of heart failure on exam -- echo pending -- continue BB and ACEi  3. HTN: improved with changes above  4. HL: has been on pravastatin 40mg  daily 2/2 to intolerance with myalgias. Unable to afford PCSK9s in the past but likely an option now with changes in coverage.  -- added Zetia 10mg  yesterday. LDL 111 this admission.   Signed, Laverda Page, NP  03/03/2018, 11:23 AM  Pager # [phone]   Patient seen and examined. Agree with assessment and plan. .  Patient feels improved today.  He is tolerating his increased bisoprolol, heart rate is  now in the 70s,  Bood pressure is improved also with the addition of amlodipine at 5 mg. Zetia 10 mg was added to his pravastatin 40 mg.  I again discussed PCSK 9 inhibition  If he cannot achieve a target LDL less than 70. troponins are mildly elevated, but in a plateau pattern against acute coronary syndrome..  Will obtain PA and lateral chest x-ray to further evaluate.  CTA suggestion of multi lobar pneumonia.  The patient is afebrile, white blood count is normal.  He does not have purulent sputum.  Echo  to be done today.   Lennette Bihari, MD, [facility] 03/03/2018 2:04 PM   For questions or updates, please contact [facility] Please consult [email or web address] for contact info under Cardiology/STEMI.

## 2018-03-03 NOTE — Progress Notes (Signed)
ANTICOAGULATION CONSULT NOTE  Pharmacy Consult for heparin Indication: chest pain/ACS  Allergies  Allergen Reactions  . Statins   . Zetia [Ezetimibe]   . Clopidogrel Other (See Comments)    Disoriented   . Codeine Rash   Patient Measurements: Height: 6\' 2"  (188 cm) Weight: 241 lb (109.3 kg) IBW/kg (Calculated) : 82.2 Heparin Dosing Weight: 104 Kg  Vital Signs: Temp: 98.1 F (36.7 C) (03/06 0528) Temp Source: Oral (03/06 0528) BP: 123/90 (03/06 0958) Pulse Rate: 78 (03/06 0528)  Labs: Recent Labs    03/02/18 0804 03/02/18 2017 03/03/18 0215 03/03/18 0841  HGB 12.3*  --  11.6*  --   HCT 37.1*  --  35.0*  --   PLT 196  --  191  --   HEPARINUNFRC  --  0.24* 0.38 0.23*  CREATININE 1.14  --   --   --   TROPONINI 0.20* 0.19* 0.18*  --      Assessment: 56yoM present with chest pain; HX of cardiac stent placement and triple bypass surgery; prasugrel PTA but no reported anticoagulation. Hgb down slightly to 11.6, plt wnl.  Heparin level dropped to subtherapeutic at 0.23. No bleed or issues with the drip reported per RN.  Goal of Therapy:  Heparin level 0.3-0.7 units/ml Monitor platelets by anticoagulation protocol: Yes   Plan:  Continue heparin infusion at 1850 units/hr 6h heparin level Monitor daily heparin level, CBC, s/sx bleeding F/u Cardiology plans   Babs BertinHaley Larz Mark, PharmD, BCPS Clinical Pharmacist Clinical phone for 03/03/2018 until 3:30pm: [phone] If after 3:30pm, please call main pharmacy at: [phone] 03/03/2018 10:01 AM

## 2018-03-03 NOTE — Progress Notes (Signed)
ANTICOAGULATION CONSULT NOTE  Pharmacy Consult for heparin Indication: chest pain/ACS  Allergies  Allergen Reactions  . Statins   . Zetia [Ezetimibe]   . Clopidogrel Other (See Comments)    Disoriented   . Codeine Rash   Patient Measurements: Height: 6\' 2"  (188 cm) Weight: 241 lb 6.4 oz (109.5 kg) IBW/kg (Calculated) : 82.2 Heparin Dosing Weight: 104 Kg  Vital Signs: Temp: 98.2 F (36.8 C) (03/05 2000) Temp Source: Oral (03/05 2000) BP: 125/86 (03/05 2000) Pulse Rate: 94 (03/05 2000)  Labs: Recent Labs    03/02/18 0804 03/02/18 2017 03/03/18 0215  HGB 12.3*  --  11.6*  HCT 37.1*  --  35.0*  PLT 196  --  191  HEPARINUNFRC  --  0.24* 0.38  CREATININE 1.14  --   --   TROPONINI 0.20* 0.19*  --      Assessment: 56yoM present with chest pain; HX of cardiac stent placement and triple bypass surgery; prasugrel PTA, but no reported anticoagulation. hgb down slightly to 11.6.  Heparin level is therapeutic.  Goal of Therapy:  Heparin level 0.3-0.7 units/ml Monitor platelets by anticoagulation protocol: Yes   Plan:  Continue heparin infusion at 1650 units/hr Daily HL, CBC Check confirmatory level   Agapito GamesAlison Kiyon Fidalgo, PharmD, BCPS Clinical Pharmacist 03/03/2018 2:47 AM

## 2018-03-04 ENCOUNTER — Telehealth: Payer: Self-pay | Admitting: Cardiology

## 2018-03-04 DIAGNOSIS — I519 Heart disease, unspecified: Secondary | ICD-10-CM

## 2018-03-04 DIAGNOSIS — I25708 Atherosclerosis of coronary artery bypass graft(s), unspecified, with other forms of angina pectoris: Secondary | ICD-10-CM

## 2018-03-04 DIAGNOSIS — R7989 Other specified abnormal findings of blood chemistry: Secondary | ICD-10-CM

## 2018-03-04 DIAGNOSIS — I255 Ischemic cardiomyopathy: Secondary | ICD-10-CM

## 2018-03-04 DIAGNOSIS — R778 Other specified abnormalities of plasma proteins: Secondary | ICD-10-CM

## 2018-03-04 LAB — CBC
HEMATOCRIT: 34.5 % — AB (ref 39.0–52.0)
HEMOGLOBIN: 11.4 g/dL — AB (ref 13.0–17.0)
MCH: 28 pg (ref 26.0–34.0)
MCHC: 33 g/dL (ref 30.0–36.0)
MCV: 84.8 fL (ref 78.0–100.0)
Platelets: 179 10*3/uL (ref 150–400)
RBC: 4.07 MIL/uL — AB (ref 4.22–5.81)
RDW: 14.3 % (ref 11.5–15.5)
WBC: 5.7 10*3/uL (ref 4.0–10.5)

## 2018-03-04 LAB — BRAIN NATRIURETIC PEPTIDE: B Natriuretic Peptide: 973.5 pg/mL — ABNORMAL HIGH (ref 0.0–100.0)

## 2018-03-04 MED ORDER — ASPIRIN 81 MG PO CHEW
81.0000 mg | CHEWABLE_TABLET | ORAL | Status: AC
  Administered 2018-03-05: 81 mg via ORAL
  Filled 2018-03-04: qty 1

## 2018-03-04 MED ORDER — SODIUM CHLORIDE 0.9 % IV SOLN
INTRAVENOUS | Status: DC
  Administered 2018-03-05: 07:00:00 via INTRAVENOUS

## 2018-03-04 MED ORDER — FUROSEMIDE 10 MG/ML IJ SOLN: 40.0000 mg | Freq: Once | INTRAMUSCULAR | Status: DC

## 2018-03-04 MED ORDER — SODIUM CHLORIDE 0.9% FLUSH
3.0000 mL | INTRAVENOUS | Status: DC | PRN
  Administered 2018-03-04: 3 mL via INTRAVENOUS
  Filled 2018-03-04: qty 3

## 2018-03-04 MED ORDER — LIVING BETTER WITH HEART FAILURE BOOK
Freq: Once | Status: AC
  Administered 2018-03-06: 09:00:00

## 2018-03-04 MED ORDER — SODIUM CHLORIDE 0.9% FLUSH
3.0000 mL | Freq: Two times a day (BID) | INTRAVENOUS | Status: DC
  Administered 2018-03-04: 3 mL via INTRAVENOUS

## 2018-03-04 MED ORDER — SODIUM CHLORIDE 0.9 % IV SOLN: 250.0000 mL | INTRAVENOUS | Status: DC | PRN

## 2018-03-04 MED ORDER — FUROSEMIDE 10 MG/ML IJ SOLN
20.0000 mg | Freq: Once | INTRAMUSCULAR | Status: DC
  Filled 2018-03-04: qty 2

## 2018-03-04 MED ORDER — LOSARTAN POTASSIUM 25 MG PO TABS
25.0000 mg | ORAL_TABLET | Freq: Every day | ORAL | Status: DC
  Administered 2018-03-05: 25 mg via ORAL
  Filled 2018-03-04: qty 1

## 2018-03-04 NOTE — Telephone Encounter (Signed)
Spoke with pt, he is currently admitted and questions regarding cath and reasons discussed. He would like for dr harding to know he is in the hospital and wanted him to look over what was going on. He would like dr harding to call him if possible. Explained will forward to dr harding.

## 2018-03-04 NOTE — Telephone Encounter (Signed)
Patient would like to speak about the cath lab. Patient has a few questions about it.

## 2018-03-04 NOTE — Progress Notes (Signed)
Given CVP of 3 on echo, Dr. Kelly recommended 20 of IV Lasix instead. BNP is noted to be elevated. Yassine Brunsman PA-C  

## 2018-03-04 NOTE — H&P (View-Only) (Signed)
Given CVP of 3 on echo, Dr. Tresa EndoKelly recommended 20 of IV Lasix instead. BNP is noted to be elevated. Corneluis Allston PA-C

## 2018-03-04 NOTE — Interval H&P Note (Signed)
Cath Lab Visit (complete for each Cath Lab visit)  Clinical Evaluation Leading to the Procedure:   ACS: Yes.    Non-ACS:    Anginal Classification: CCS Francis  Anti-ischemic medical therapy: Maximal Therapy (2 or more classes of medications)  Non-Invasive Test Results: No non-invasive testing performed  Prior CABG: Previous CABG      History and Physical Interval Note:  03/04/2018 5:07 PM  Calvin Francis  has presented today for surgery, with the diagnosis of chf, cp  The various methods of treatment have been discussed with the patient and family. After consideration of risks, benefits and other options for treatment, the patient has consented to  Procedure(s): RIGHT/LEFT HEART CATH AND CORONARY/GRAFT ANGIOGRAPHY (N/A) as a surgical intervention .  The patient's history has been reviewed, patient examined, no change in status, stable for surgery.  I have reviewed the patient's chart and labs.  Questions were answered to the patient's satisfaction.     Calvin Francis

## 2018-03-04 NOTE — Progress Notes (Addendum)
Progress Note  Patient Name: Calvin Francis Date of Encounter: 03/04/2018  Primary Cardiologist: Dr. Herbie BaltimoreHarding  Subjective   No sputum production. Major complaint is DOE with even minimal activity as well as orthopnea. No recent weight gain or increased swelling. Also admits to increased frequency of chronic chest pain.   Inpatient Medications    Scheduled Meds: . amLODipine  5 mg Oral Daily  . aspirin EC  81 mg Oral Daily  . bisoprolol  5 mg Oral Daily  . ezetimibe  10 mg Oral Daily  . lisinopril  2.5 mg Oral Daily  . prasugrel  10 mg Oral Daily  . pravastatin  40 mg Oral Daily  . ranolazine  1,000 mg Oral BID   Continuous Infusions: . azithromycin Stopped (03/03/18 1912)  . cefTRIAXone (ROCEPHIN)  IV Stopped (03/03/18 1857)   PRN Meds: acetaminophen, albuterol, ALPRAZolam, guaiFENesin-dextromethorphan, nitroGLYCERIN, ondansetron (ZOFRAN) IV   Vital Signs    Vitals:   03/03/18 0958 03/03/18 1715 03/03/18 2018 03/04/18 0614  BP: 123/90 119/88 108/75 118/86  Pulse:  73 81 75  Resp:  18 17 20   Temp:  97.9 F (36.6 C) 97.9 F (36.6 C) 98 F (36.7 C)  TempSrc:  Oral Oral Oral  SpO2:  92% 99% 100%  Weight:    238 lb 8 oz (108.2 kg)  Height:        Intake/Output Summary (Last 24 hours) at 03/04/2018 1018 Last data filed at 03/04/2018 0846 Gross per 24 hour  Intake 950 ml  Output -  Net 950 ml   Filed Weights   03/02/18 1956 03/03/18 0528 03/04/18 0614  Weight: 241 lb 6.4 oz (109.5 kg) 241 lb (109.3 kg) 238 lb 8 oz (108.2 kg)    Telemetry    NSR, 4-7 beats NSVT - Personally Reviewed  Physical Exam   GEN: No acute distress.  HEENT: Normocephalic, atraumatic, sclera non-icteric. Neck: No JVD or bruits. Cardiac: RRR no murmurs, rubs, or gallops.  Radials/DP/PT 1+ and equal bilaterally.  Respiratory: Diminished BS bilaterally. Breathing is unlabored. GI: Soft, nontender, non-distended, BS +x 4. MS: no deformity. Extremities: No clubbing or cyanosis. No edema.  Distal pedal pulses are 2+ and equal bilaterally. Neuro:  AAOx3. Follows commands. Psych:  Responds to questions appropriately with a normal affect.  Labs    Chemistry Recent Labs  Lab 03/02/18 0804 03/03/18 0841  NA 138 139  K 4.3 4.1  CL 106 103  CO2 23 25  GLUCOSE 100* 106*  BUN 8 7  CREATININE 1.14 1.25*  CALCIUM 9.1 9.1  PROT 6.9  --   ALBUMIN 4.1  --   AST 21  --   ALT 19  --   ALKPHOS 58  --   BILITOT 0.6  --   GFRNONAA >60 >60  GFRAA >60 >60  ANIONGAP 9 11     Hematology Recent Labs  Lab 03/02/18 0804 03/03/18 0215 03/04/18 0529  WBC 5.9 8.0 5.7  RBC 4.38 4.19* 4.07*  HGB 12.3* 11.6* 11.4*  HCT 37.1* 35.0* 34.5*  MCV 84.7 83.5 84.8  MCH 28.1 27.7 28.0  MCHC 33.2 33.1 33.0  RDW 14.1 14.0 14.3  PLT 196 191 179    Cardiac Enzymes Recent Labs  Lab 03/02/18 0804 03/02/18 2017 03/03/18 0215 03/03/18 0841  TROPONINI 0.20* 0.19* 0.18* 0.13*   No results for input(s): TROPIPOC in the last 168 hours.   BNPNo results for input(s): BNP, PROBNP in the last 168 hours.   DDimer No  results for input(s): DDIMER in the last 168 hours.   Radiology    Ct Angio Chest Pe W/cm &/or Wo Cm  Result Date: 03/02/2018 CLINICAL DATA:  Shortness of breath and chest pain. Known coronary artery disease. PE suspected, high pretest probability. EXAM: CT ANGIOGRAPHY CHEST WITH CONTRAST TECHNIQUE: Multidetector CT imaging of the chest was performed using the standard protocol during bolus administration of intravenous contrast. Multiplanar CT image reconstructions and MIPs were obtained to evaluate the vascular anatomy. CONTRAST:  ISOVUE-370 IOPAMIDOL (ISOVUE-370) INJECTION 76% COMPARISON:  Two-view chest x-ray 09-29-2018 FINDINGS: Cardiovascular: Heart is enlarged. Coronary artery calcifications are present. Stents are in place. The patient is status post median sternotomy for CABG. Atherosclerotic calcifications are present at the aortic arch. Pulmonary artery opacification  is excellent. The no focal filling defects are present to suggest pulmonary emboli. Mediastinum/Nodes: Subcentimeter right paratracheal lymph nodes are present. Prevascular nodes measure up to 11 mm. No significant hilar or axillary adenopathy is present. Lungs/Pleura: Patchy ground-glass airspace disease is present in the lower lobes bilaterally, left greater than right. There is some dependent atelectasis. Interlobular septal thickening is present. Small effusions are noted bilaterally. Upper Abdomen: Limited imaging of the upper abdomen is unremarkable. Musculoskeletal: Mild endplate changes are present at T10-11 and T11-12. Vertebral body heights and alignment are normal. No focal lytic or blastic lesions are present. Ribs are within normal limits bilaterally. Review of the MIP images confirms the above findings. IMPRESSION: 1. No pulmonary embolus. 2. Cardiomegaly with interstitial edema and small effusions compatible with congestive heart failure. 3. Patchy ground-glass airspace opacities in the lower lobes bilaterally consistent with multi lobar pneumonia. Electronically Signed   By: Marin Roberts M.D.   On: 09-29-2018 13:07    Cardiac Studies   2D Echo 03/03/18 Study Conclusions  - Left ventricle: The cavity size was severely dilated. Wall   thickness was increased in a pattern of mild LVH. Systolic   function was severely reduced. The estimated ejection fraction   was in the range of 20% to 25%. Diffuse hypokinesis. Doppler   parameters are consistent with restrictive physiology, indicative   of decreased left ventricular diastolic compliance and/or   increased left atrial pressure. Doppler parameters are consistent   with high ventricular filling pressure. - Mitral valve: There was mild regurgitation. - Left atrium: The atrium was mildly dilated. - Right ventricle: The cavity size was mildly dilated. Systolic   function was mildly reduced. - Right atrium: The atrium was mildly  dilated. Impressions: - Severe global reduction in LV systolic function; restrictive   filling; mild LVH; severe LVE; mild MR; mild biatrial   enlargement; mild RVE with mildly reduced RV function.  Patient Profile     57 y.o. male with PMH of CAD s/p CABG 4v (2005) with 2/4 patent grafts in 2013, ICM/chronic systolic CHF (EF 16% in 2013 but down this adm), HTN, HL, GERD, possible Barrett's esophagus, esophageal dysmotility who presented with cough and chest pain.   Assessment & Plan    1. Dyspnea/acute on chronic chest pain with CAD as above, elevated troponins, suspect due to acute on chronic combined systolic and diastolic CHF/ICM with declining EF - echo shows severe global reduction in LV function EF 20-25% also with mildly reduced RV function. I suspect his recent symptoms are due to CHF, but cannot exclude progressive ischemia causing drop in EF. Will try a trial of Lasix 40mg  today IV - he is Lasix naive. Will dc the amlodipine that was started  in lieu of likely focusing therapies towards optimization of HF regimen- he got this today already. Will d/c lisinopril as well (also received today) and start losartan tomorrow with an eye towards changing to [facility] if Cr/BP allow. Continue beta blocker. Anticipate that he will need R/LHC in AM to further evaluate (Dr. Tresa Endo to discuss with pt). Reviewed 2g sodium restriction, 2L fluid restriction, daily weights with patient. Will rx CHF book and have dietitian come by to review CHF diet (low sodium).   2. Possible multilobar PNA by CT - CXR was unremarkable on 3/5 but CTA suggested interstitial edema as well as bilateral PNA. He is afebrile with normal WBC. Has not been able to produce sputum yet to obtain, so doubt clinical PNA - I do wonder if this represents HF. Follow clinical response to diuresis. D/c abx.  3. HTN - controlled.  4. Hyperlipidemia - Zetia added; Dr. Tresa Endo suggested PCSK 9 if LDL does not reach target <16.  5.  Snoring/obesity - Dr. Tresa Endo recommends OP sleep study.  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 Follow.  For questions or updates, please contact [facility] Please consult [email or web address] for contact info under Cardiology/STEMI.  Signed, Laurann Montana, PA-C 03/04/2018, 10:18 AM    Patient seen and examined. Agree with assessment and plan. ? Inaccurate I/O's.  Echo Doppler data reviewed.  EF is significantly reduced since his last evaluation is now 20-25% with restrictive physiology consistent with grade 3 diastolic dysfunction.  He feels improved on his increased medical regimen.  In light of his significant reduction in LV function, I have recommended definitive right and left heart cardiac catheterization were discussed the risks, benefits of the procedure.  We will schedule this to be done tomorrow.  Ultimately, he will need to be transitioned to [facility].  Will hold lisinopril today in anticipation of this transition after 36 hours as long as renal function remains stable.  Will discontinue antibiotics, which were ordered by the ER physician.  Will obtain BNP today.    Lennette Bihari, MD, [facility] 03/04/2018 11:37 AM

## 2018-03-05 ENCOUNTER — Ambulatory Visit: Payer: Medicare HMO | Admitting: Cardiology

## 2018-03-05 ENCOUNTER — Encounter (HOSPITAL_COMMUNITY)
Admission: EM | Disposition: A | Discharge status: Home / Self Care | Payer: Self-pay | Source: Home / Self Care | Attending: Cardiovascular Disease

## 2018-03-05 ENCOUNTER — Encounter (HOSPITAL_COMMUNITY): Payer: Self-pay | Admitting: Interventional Cardiology

## 2018-03-05 DIAGNOSIS — I509 Heart failure, unspecified: Secondary | ICD-10-CM

## 2018-03-05 DIAGNOSIS — I214 Non-ST elevation (NSTEMI) myocardial infarction: Secondary | ICD-10-CM

## 2018-03-05 DIAGNOSIS — R748 Abnormal levels of other serum enzymes: Secondary | ICD-10-CM

## 2018-03-05 DIAGNOSIS — Z951 Presence of aortocoronary bypass graft: Secondary | ICD-10-CM

## 2018-03-05 HISTORY — PX: RIGHT/LEFT HEART CATH AND CORONARY/GRAFT ANGIOGRAPHY: CATH118267

## 2018-03-05 LAB — BASIC METABOLIC PANEL WITH GFR
Anion gap: 10 (ref 5–15)
BUN: 10 mg/dL (ref 6–20)
CO2: 24 mmol/L (ref 22–32)
Calcium: 9 mg/dL (ref 8.9–10.3)
Chloride: 104 mmol/L (ref 101–111)
Creatinine, Ser: 1.33 mg/dL — ABNORMAL HIGH (ref 0.61–1.24)
GFR calc Af Amer: >60 mL/min (ref >60)
GFR calc non Af Amer: 59 mL/min — ABNORMAL LOW (ref >60)
Glucose, Bld: 114 mg/dL — ABNORMAL HIGH (ref 65–99)
Potassium: 4.3 mmol/L (ref 3.5–5.1)
Sodium: 138 mmol/L (ref 135–145)

## 2018-03-05 LAB — CBC
HEMATOCRIT: 35.9 % — AB (ref 39.0–52.0)
HEMOGLOBIN: 11.9 g/dL — AB (ref 13.0–17.0)
MCH: 28 pg (ref 26.0–34.0)
MCHC: 33.1 g/dL (ref 30.0–36.0)
MCV: 84.5 fL (ref 78.0–100.0)
Platelets: 195 10*3/uL (ref 150–400)
RBC: 4.25 MIL/uL (ref 4.22–5.81)
RDW: 14.3 % (ref 11.5–15.5)
WBC: 6.5 10*3/uL (ref 4.0–10.5)

## 2018-03-05 LAB — POCT I-STAT 3, ART BLOOD GAS (G3+)
ACID-BASE DEFICIT: 1 mmol/L (ref 0.0–2.0)
Bicarbonate: 23.3 mmol/L (ref 20.0–28.0)
O2 Saturation: 99 %
PCO2 ART: 36.1 mmHg (ref 32.0–48.0)
PO2 ART: 120 mmHg — AB (ref 83.0–108.0)
TCO2: 24 mmol/L (ref 22–32)
pH, Arterial: 7.418 (ref 7.350–7.450)

## 2018-03-05 LAB — POCT I-STAT 3, VENOUS BLOOD GAS (G3P V)
BICARBONATE: 25.1 mmol/L (ref 20.0–28.0)
O2 Saturation: 59 %
PCO2 VEN: 43.6 mmHg — AB (ref 44.0–60.0)
PH VEN: 7.368 (ref 7.250–7.430)
TCO2: 26 mmol/L (ref 22–32)
pO2, Ven: 32 mmHg (ref 32.0–45.0)

## 2018-03-05 LAB — BRAIN NATRIURETIC PEPTIDE: B Natriuretic Peptide: 1026.3 pg/mL — ABNORMAL HIGH (ref 0.0–100.0)

## 2018-03-05 LAB — PROTIME-INR
INR: 1.14
Prothrombin Time: 14.5 seconds (ref 11.4–15.2)

## 2018-03-05 LAB — CARDIAC CATHETERIZATION: Cath EF Quantitative: 15 %

## 2018-03-05 SURGERY — RIGHT/LEFT HEART CATH AND CORONARY/GRAFT ANGIOGRAPHY
Anesthesia: LOCAL

## 2018-03-05 MED ORDER — ENOXAPARIN SODIUM 40 MG/0.4ML ~~LOC~~ SOLN
40.0000 mg | SUBCUTANEOUS | Status: DC
  Administered 2018-03-06: 40 mg via SUBCUTANEOUS
  Filled 2018-03-05 (×2): qty 0.4

## 2018-03-05 MED ORDER — ASPIRIN 81 MG PO CHEW
81.0000 mg | CHEWABLE_TABLET | Freq: Every day | ORAL | Status: DC
  Administered 2018-03-06 – 2018-03-07 (×2): 81 mg via ORAL
  Filled 2018-03-05 (×2): qty 1

## 2018-03-05 MED ORDER — ONDANSETRON HCL 4 MG/2ML IJ SOLN
4.0000 mg | Freq: Four times a day (QID) | INTRAMUSCULAR | Status: DC | PRN
Start: 2018-03-05 — End: 2018-03-05

## 2018-03-05 MED ORDER — FENTANYL CITRATE (PF) 100 MCG/2ML IJ SOLN
INTRAMUSCULAR | Status: DC | PRN
  Administered 2018-03-05: 50 ug via INTRAVENOUS

## 2018-03-05 MED ORDER — CARVEDILOL 3.125 MG PO TABS
3.1250 mg | ORAL_TABLET | Freq: Two times a day (BID) | ORAL | Status: DC
  Administered 2018-03-06 – 2018-03-07 (×4): 3.125 mg via ORAL
  Filled 2018-03-05 (×4): qty 1

## 2018-03-05 MED ORDER — HEPARIN SODIUM (PORCINE) 1000 UNIT/ML IJ SOLN
INTRAMUSCULAR | Status: AC
  Filled 2018-03-05: qty 1

## 2018-03-05 MED ORDER — MIDAZOLAM HCL 2 MG/2ML IJ SOLN
INTRAMUSCULAR | Status: DC | PRN
  Administered 2018-03-05 (×2): 1 mg via INTRAVENOUS

## 2018-03-05 MED ORDER — MIDAZOLAM HCL 2 MG/2ML IJ SOLN
INTRAMUSCULAR | Status: AC
  Filled 2018-03-05: qty 2

## 2018-03-05 MED ORDER — HEPARIN SODIUM (PORCINE) 1000 UNIT/ML IJ SOLN
INTRAMUSCULAR | Status: DC | PRN
  Administered 2018-03-05: 5000 [IU] via INTRAVENOUS

## 2018-03-05 MED ORDER — SODIUM CHLORIDE 0.9 % IV SOLN: 250.0000 mL | INTRAVENOUS | Status: DC | PRN

## 2018-03-05 MED ORDER — SPIRONOLACTONE 12.5 MG HALF TABLET
12.5000 mg | ORAL_TABLET | Freq: Once | ORAL | Status: AC
  Administered 2018-03-05: 12.5 mg via ORAL
  Filled 2018-03-05: qty 1

## 2018-03-05 MED ORDER — FENTANYL CITRATE (PF) 100 MCG/2ML IJ SOLN
INTRAMUSCULAR | Status: AC
  Filled 2018-03-05: qty 2

## 2018-03-05 MED ORDER — SACUBITRIL-VALSARTAN 24-26 MG PO TABS
1.0000 | ORAL_TABLET | Freq: Two times a day (BID) | ORAL | Status: DC
  Administered 2018-03-06 – 2018-03-07 (×3): 1 via ORAL
  Filled 2018-03-05 (×4): qty 1

## 2018-03-05 MED ORDER — LIDOCAINE HCL 1 % IJ SOLN
INTRAMUSCULAR | Status: AC
  Filled 2018-03-05: qty 20

## 2018-03-05 MED ORDER — ACETAMINOPHEN 325 MG PO TABS: 650.0000 mg | ORAL_TABLET | ORAL | Status: DC | PRN

## 2018-03-05 MED ORDER — VERAPAMIL HCL 2.5 MG/ML IV SOLN
INTRAVENOUS | Status: AC
  Filled 2018-03-05: qty 2

## 2018-03-05 MED ORDER — ASPIRIN 81 MG PO CHEW: 81.0000 mg | CHEWABLE_TABLET | Freq: Every day | ORAL | Status: DC

## 2018-03-05 MED ORDER — HEPARIN (PORCINE) IN NACL 2-0.9 UNIT/ML-% IJ SOLN
INTRAMUSCULAR | Status: AC | PRN
  Administered 2018-03-05 (×2): 500 mL

## 2018-03-05 MED ORDER — SODIUM CHLORIDE 0.9% FLUSH
3.0000 mL | INTRAVENOUS | Status: DC | PRN
  Administered 2018-03-05: 3 mL via INTRAVENOUS
  Filled 2018-03-05: qty 3

## 2018-03-05 MED ORDER — IOPAMIDOL (ISOVUE-370) INJECTION 76%
INTRAVENOUS | Status: DC | PRN
  Administered 2018-03-05: 125 mL via INTRA_ARTERIAL

## 2018-03-05 MED ORDER — IOPAMIDOL (ISOVUE-370) INJECTION 76%
INTRAVENOUS | Status: AC
  Filled 2018-03-05: qty 50

## 2018-03-05 MED ORDER — LIDOCAINE HCL (PF) 1 % IJ SOLN
INTRAMUSCULAR | Status: DC | PRN
  Administered 2018-03-05: 5 mL

## 2018-03-05 MED ORDER — SODIUM CHLORIDE 0.9% FLUSH
3.0000 mL | Freq: Two times a day (BID) | INTRAVENOUS | Status: DC
  Administered 2018-03-05 – 2018-03-07 (×4): 3 mL via INTRAVENOUS

## 2018-03-05 MED ORDER — IOPAMIDOL (ISOVUE-370) INJECTION 76%
INTRAVENOUS | Status: AC
  Filled 2018-03-05: qty 125

## 2018-03-05 MED ORDER — VERAPAMIL HCL 2.5 MG/ML IV SOLN
INTRAVENOUS | Status: DC | PRN
  Administered 2018-03-05: 10 mL via INTRA_ARTERIAL

## 2018-03-05 MED ORDER — HEPARIN (PORCINE) IN NACL 2-0.9 UNIT/ML-% IJ SOLN
INTRAMUSCULAR | Status: AC
  Filled 2018-03-05: qty 1000

## 2018-03-05 SURGICAL SUPPLY — 13 items
CATH BALLN WEDGE 5F 110CM (CATHETERS) ×1 IMPLANT
CATH INFINITI 5FR AL1 (CATHETERS) ×1 IMPLANT
CATH INFINITI 5FR MULTPACK ANG (CATHETERS) ×1 IMPLANT
DEVICE RAD COMP TR BAND LRG (VASCULAR PRODUCTS) ×1 IMPLANT
GLIDESHEATH SLEND A-KIT 6F 22G (SHEATH) ×1 IMPLANT
GUIDEWIRE .025 260CM (WIRE) ×1 IMPLANT
GUIDEWIRE INQWIRE 1.5J.035X260 (WIRE) IMPLANT
INQWIRE 1.5J .035X260CM (WIRE) ×2
KIT HEART LEFT (KITS) ×2 IMPLANT
PACK CARDIAC CATHETERIZATION (CUSTOM PROCEDURE TRAY) ×2 IMPLANT
SHEATH GLIDE SLENDER 4/5FR (SHEATH) ×1 IMPLANT
TRANSDUCER W/STOPCOCK (MISCELLANEOUS) ×2 IMPLANT
TUBING CIL FLEX 10 FLL-RA (TUBING) ×2 IMPLANT

## 2018-03-05 NOTE — Plan of Care (Signed)
Nutrition Education Note  RD consulted for nutrition education regarding CHF/low sodium diet.  Intern provided "Low Sodium Nutrition Therapy" handout from the [employer] of [employer].  Reviewed patient's dietary recall. Discussed ways to decrease sodium intake in pt's diet. Discouraged intake of processed foods and use of salt shaker. Encouraged fresh or frozen fruit and vegetable intake as well as the use of salt free seasonings. Discussed the importance of reading the nutrition label for sodium content relative the the serving size consumed. Provided goal of 2000mg  sodium per day.  Intern discussed why it is important for patient to adhere to diet recommendations, and emphasized the role of fluids, foods to avoid, and importance of weighing self daily.   Teach back method used. Expect fair compliance.  Body mass index is 30.35 kg/m. Pt meets criteria for obesity based on current BMI.  Current diet order is HH; patient is consuming approximately 100% of meals at this time.   Labs and medications reviewed.   No further nutrition interventions warranted at this time.  If additional nutrition issues arise, please re-consult RD.   Calvin Hemmingway, MS, Dietetic Intern Pager # [phone]

## 2018-03-05 NOTE — Progress Notes (Addendum)
Progress Note  Patient Name: Calvin Francis Date of Encounter: 03/05/2018  Primary Cardiologist: Herbie Baltimore  Subjective   No chest pain; Had cath earlier today.  Inpatient Medications    Scheduled Meds: . [START ON 03/06/2018] aspirin  81 mg Oral Daily  . bisoprolol  5 mg Oral Daily  . [START ON 03/06/2018] enoxaparin (LOVENOX) injection  40 mg Subcutaneous Q24H  . ezetimibe  10 mg Oral Daily  . furosemide  20 mg Intravenous Once  . Living Better with Heart Failure Book   Does not apply Once  . losartan  25 mg Oral Daily  . prasugrel  10 mg Oral Daily  . pravastatin  40 mg Oral Daily  . ranolazine  1,000 mg Oral BID  . sodium chloride flush  3 mL Intravenous Q12H   Continuous Infusions: . sodium chloride    . cefTRIAXone (ROCEPHIN)  IV 1 g (03/04/18 1812)   PRN Meds: sodium chloride, acetaminophen, albuterol, ALPRAZolam, guaiFENesin-dextromethorphan, nitroGLYCERIN, ondansetron (ZOFRAN) IV, sodium chloride flush   Vital Signs    Vitals:   03/05/18 0839 03/05/18 0939 03/05/18 0954 03/05/18 1647  BP: 112/79 125/75 135/86 131/89  Pulse: 66   74  Resp: (!) 8   18  Temp:    97.8 F (36.6 C)  TempSrc:    Oral  SpO2: 100% 98% 98% 100%  Weight:      Height:        Intake/Output Summary (Last 24 hours) at 03/05/2018 1755 Last data filed at 03/05/2018 1000 Gross per 24 hour  Intake 973.17 ml  Output -  Net 973.17 ml    I/O since admission: +3984 ? accurate  Filed Weights   03/03/18 0528 03/04/18 0614 03/05/18 0500  Weight: 241 lb (109.3 kg) 238 lb 8 oz (108.2 kg) 236 lb 6.4 oz (107.2 kg)    Telemetry    Sinus - Personally Reviewed  ECG    ECG (independently read by me): NSR 88  Physical Exam   BP 131/89 (BP Location: Left Leg)   Pulse 74   Temp 97.8 F (36.6 C) (Oral)   Resp 18   Ht 6\' 2"  (1.88 m)   Wt 236 lb 6.4 oz (107.2 kg)   SpO2 100%   BMI 30.35 kg/m  General: Alert, oriented, no distress.  Skin: normal turgor, no rashes, warm and dry HEENT:  Normocephalic, atraumatic. Pupils equal round and reactive to light; sclera anicteric; extraocular muscles intact;  Nose without nasal septal hypertrophy Mouth/Parynx benign; Mallinpatti scale 3 Neck: No JVD, no carotid bruits; normal carotid upstroke Lungs: lightly decreased at bases; no wheezing or rales Chest wall: without tenderness to palpitation Heart: PMI not displaced, RRR, s1 s2 normal, 1/6 systolic murmur, no diastolic murmur, no rubs, gallops, thrills, or heaves Abdomen: soft, nontender; no hepatosplenomehaly, BS+; abdominal aorta nontender and not dilated by palpation. Back: no CVA tenderness Pulses 2+ ; left radial site stable Musculoskeletal: full range of motion, normal strength, no joint deformities Extremities: no clubbing cyanosis or edema, Homan's sign negative  Neurologic: grossly nonfocal; Cranial nerves grossly wnl Psychologic: Normal mood and affect   Labs    Chemistry Recent Labs  Lab 03/02/18 0804 03/03/18 0841 03/05/18 0608  NA 138 139 138  K 4.3 4.1 4.3  CL 106 103 104  CO2 23 25 24   GLUCOSE 100* 106* 114*  BUN 8 7 10   CREATININE 1.14 1.25* 1.33*  CALCIUM 9.1 9.1 9.0  PROT 6.9  --   --  ALBUMIN 4.1  --   --   AST 21  --   --   ALT 19  --   --   ALKPHOS 58  --   --   BILITOT 0.6  --   --   GFRNONAA >60 >60 59*  GFRAA >60 >60 >60  ANIONGAP 9 11 10      Hematology Recent Labs  Lab 03/03/18 0215 03/04/18 0529 03/05/18 0608  WBC 8.0 5.7 6.5  RBC 4.19* 4.07* 4.25  HGB 11.6* 11.4* 11.9*  HCT 35.0* 34.5* 35.9*  MCV 83.5 84.8 84.5  MCH 27.7 28.0 28.0  MCHC 33.1 33.0 33.1  RDW 14.0 14.3 14.3  PLT 191 179 195    Cardiac Enzymes Recent Labs  Lab 03/02/18 0804 03/02/18 2017 03/03/18 0215 03/03/18 0841  TROPONINI 0.20* 0.19* 0.18* 0.13*   No results for input(s): TROPIPOC in the last 168 hours.   BNP Recent Labs  Lab 03/04/18 0529 03/05/18 0608  BNP 973.5* 1,026.3*     DDimer No results for input(s): DDIMER in the last 168  hours.   Lipid Panel     Component Value Date/Time   CHOL 174 03/03/2018 0215   TRIG 151 (H) 03/03/2018 0215   HDL 33 (L) 03/03/2018 0215   CHOLHDL 5.3 03/03/2018 0215   VLDL 30 03/03/2018 0215   LDLCALC 111 (H) 03/03/2018 0215   LDLDIRECT 158.0 09/29/2016 1008    Radiology    No results found.  Cardiac Studies   2D Echo 03/03/18 Study Conclusions  - Left ventricle: The cavity size was severely dilated. Wall thickness was increased in a pattern of mild LVH. Systolic function was severely reduced. The estimated ejection fraction was in the range of 20% to 25%. Diffuse hypokinesis. Doppler parameters are consistent with restrictive physiology, indicative of decreased left ventricular diastolic compliance and/or increased left atrial pressure. Doppler parameters are consistent with high ventricular filling pressure. - Mitral valve: There was mild regurgitation. - Left atrium: The atrium was mildly dilated. - Right ventricle: The cavity size was mildly dilated. Systolic function was mildly reduced. - Right atrium: The atrium was mildly dilated. Impressions: - Severe global reduction in LV systolic function; restrictive filling; mild LVH; severe LVE; mild MR; mild biatrial enlargement; mild RVE with mildly reduced RV function.    R & L Cardiac Conclusion     2nd Mrg lesion is 65% stenosed.    Ischemic cardiomyopathy, with acute on chronic systolic heart failure.  Angiographic estimation of EF is 10-20%.  Severely elevated LVEDP and moderately elevated pulmonary capillary wedge pressure.  Severe occlusive native vessel disease with occlusion of the proximal to mid LAD, total occlusion of the proximal to mid circumflex, and total occlusion of the mid RCA.  Bypass graft occlusive disease with total occlusion of the SVG to RCA and SVG to diagonal.  Patent LIMA to the LAD with severe diffuse disease in the LAD distal to the graft insertion  site.  Patent free radial to the obtuse marginal with 60-70% distal anastomosis stenosis.  RECOMMENDATIONS:   Aggressive guideline directed therapy for systolic left ventricular dysfunction.  Advanced heart failure therapies should be considered.  Interventional/revascularization options are nil.           Patient Profile     57 y.o.malewith PMH of CAD s/p CABG 4v (2005) with 2/4 patent grafts in 2013, ICM/chronic systolic CHF (EF 45%40% in 2013 but down this adm), HTN, HL, GERD, possible Barrett's esophagus, esophageal dysmotility who presented with cough and  chest pain.   Assessment & Plan    1. Severe multivessel CAD: cath data reviewed. Occlusion of the proximal to mid LAD, total occlusion of the proximal to mid circumflex, and total occlusion of the mid RCA Bypass graft occlusive disease with total occlusion of the SVG to RCA and SVG to diagonal.  Patent LIMA to the LAD with severe diffuse disease in the LAD distal to the graft insertion site. Patent free radial to the obtuse marginal with 60-70% distal anastomosis stenosis.  2. Severe ischemic cardiomyopathy; EF 10 - 15% at cath; LVEDP 26mm; PA systolic pressure 45 mm. Losartan was added yesterday.  Will change to Entresto 24/26 mg twice a day beginning in a.m. with plans for ultimate dose titration, ideally to a maximum dose of 97/103 mg twice a day gradually as an outpatient. Continue Ranexa 1000 mg twice a day,  bisoprolol 5 mg dose was increased from 2.5 mg PTA but with significant LV dysfunction will change bisoprolol to carvedilol.  Plan to add aldosterone blockade with spironolactone if his renal function and potassium remain stable. He had received a dose of iv lasix x1 yesterday, will hold starting oral lasixuntil make sure BP tolerates initiation of entresto tomorrow.   In a.m., recommend contacting the LifeVest rep. The patient will need a LifeVest prior to  possible discharge on Sunday.  Plan optimization of medical  therapy for several months and if LV function does not improve, he will need an ICD.  In a.m. Will check a follow-up BNP and proBNP, and in 4 weeks recommend a follow-up pro-BNP level to assess efficacy of Entresto( sacubitrol will increase BNP).  3. Stage 3 CKD:  Cr1.33; monitor with medication adjustment  4. Hyperlipidemia with target less than 70:   Prior to admission, the patient was on low-dose pravastatin at 20 mg and had been intolerant to other statins. Feliz Beam statin was increased to 40 mg and Zetia was added.  I have had a lengthy discussion regarding PCSK9 inhibition thoroughly reviewed the Repatha Fourier trial data with him.  5. Plan for outpatient evaluation of OSA.    Time spent: 45 minutes Signed, Lennette Bihari, MD, [facility] 03/05/2018, 5:55 PM

## 2018-03-06 LAB — CBC
HEMATOCRIT: 36.2 % — AB (ref 39.0–52.0)
HEMOGLOBIN: 12 g/dL — AB (ref 13.0–17.0)
MCH: 28.3 pg (ref 26.0–34.0)
MCHC: 33.1 g/dL (ref 30.0–36.0)
MCV: 85.4 fL (ref 78.0–100.0)
Platelets: 188 10*3/uL (ref 150–400)
RBC: 4.24 MIL/uL (ref 4.22–5.81)
RDW: 14.6 % (ref 11.5–15.5)
WBC: 6.2 10*3/uL (ref 4.0–10.5)

## 2018-03-06 LAB — BASIC METABOLIC PANEL
ANION GAP: 10 (ref 5–15)
BUN: 9 mg/dL (ref 6–20)
CALCIUM: 8.9 mg/dL (ref 8.9–10.3)
CO2: 22 mmol/L (ref 22–32)
Chloride: 105 mmol/L (ref 101–111)
Creatinine, Ser: 1.19 mg/dL (ref 0.61–1.24)
GFR calc Af Amer: >60 mL/min (ref >60)
Glucose, Bld: 131 mg/dL — ABNORMAL HIGH (ref 65–99)
POTASSIUM: 4.2 mmol/L (ref 3.5–5.1)
SODIUM: 137 mmol/L (ref 135–145)

## 2018-03-06 MED ORDER — FUROSEMIDE 10 MG/ML IJ SOLN
40.0000 mg | Freq: Once | INTRAMUSCULAR | Status: AC
  Administered 2018-03-06: 40 mg via INTRAVENOUS
  Filled 2018-03-06: qty 4

## 2018-03-06 NOTE — Progress Notes (Signed)
Progress Note  Patient Name: Calvin Francis Date of Encounter: 03/06/2018  Primary Cardiologist: Bryan Lemma, MD   Subjective   No complaints  Inpatient Medications    Scheduled Meds: . aspirin  81 mg Oral Daily  . carvedilol  3.125 mg Oral BID WC  . enoxaparin (LOVENOX) injection  40 mg Subcutaneous Q24H  . ezetimibe  10 mg Oral Daily  . furosemide  20 mg Intravenous Once  . prasugrel  10 mg Oral Daily  . pravastatin  40 mg Oral Daily  . ranolazine  1,000 mg Oral BID  . sacubitril-valsartan  1 tablet Oral BID  . sodium chloride flush  3 mL Intravenous Q12H   Continuous Infusions: . sodium chloride     PRN Meds: sodium chloride, acetaminophen, albuterol, ALPRAZolam, guaiFENesin-dextromethorphan, nitroGLYCERIN, ondansetron (ZOFRAN) IV, sodium chloride flush   Vital Signs    Vitals:   03/05/18 1647 03/05/18 2121 03/06/18 0558 03/06/18 0832  BP: 131/89 110/74 109/77 106/74  Pulse: 74 73 69   Resp: 18 18 19    Temp: 97.8 F (36.6 C) 98.1 F (36.7 C) 98.8 F (37.1 C)   TempSrc: Oral Oral Oral   SpO2: 100% 99% 100%   Weight:   237 lb 8 oz (107.7 kg)   Height:        Intake/Output Summary (Last 24 hours) at 03/06/2018 0955 Last data filed at 03/06/2018 0616 Gross per 24 hour  Intake 619.17 ml  Output 1200 ml  Net -580.83 ml   Filed Weights   03/04/18 0614 03/05/18 0500 03/06/18 0558  Weight: 238 lb 8 oz (108.2 kg) 236 lb 6.4 oz (107.2 kg) 237 lb 8 oz (107.7 kg)    Telemetry    SR - Personally Reviewed  ECG    n/a  Physical Exam   GEN: No acute distress.   Neck: No JVD Cardiac: RRR, no murmurs, rubs, or gallops.  Respiratory: Clear to auscultation bilaterally. GI: Soft, nontender, non-distended  MS: No edema; No deformity. Neuro:  Nonfocal  Psych: Normal affect   Labs    Chemistry Recent Labs  Lab 03/02/18 0804 03/03/18 0841 03/05/18 0608 03/06/18 0455  NA 138 139 138 137  K 4.3 4.1 4.3 4.2  CL 106 103 104 105  CO2 23 25 24 22     GLUCOSE 100* 106* 114* 131*  BUN 8 7 10 9   CREATININE 1.14 1.25* 1.33* 1.19  CALCIUM 9.1 9.1 9.0 8.9  PROT 6.9  --   --   --   ALBUMIN 4.1  --   --   --   AST 21  --   --   --   ALT 19  --   --   --   ALKPHOS 58  --   --   --   BILITOT 0.6  --   --   --   GFRNONAA >60 >60 59* >60  GFRAA >60 >60 >60 >60  ANIONGAP 9 11 10 10      Hematology Recent Labs  Lab 03/04/18 0529 03/05/18 0608 03/06/18 0455  WBC 5.7 6.5 6.2  RBC 4.07* 4.25 4.24  HGB 11.4* 11.9* 12.0*  HCT 34.5* 35.9* 36.2*  MCV 84.8 84.5 85.4  MCH 28.0 28.0 28.3  MCHC 33.0 33.1 33.1  RDW 14.3 14.3 14.6  PLT 179 195 188    Cardiac Enzymes Recent Labs  Lab 03/02/18 0804 03/02/18 2017 03/03/18 0215 03/03/18 0841  TROPONINI 0.20* 0.19* 0.18* 0.13*   No results for input(s): TROPIPOC in  the last 168 hours.   BNP Recent Labs  Lab 03/04/18 0529 03/05/18 0608  BNP 973.5* 1,026.3*     DDimer No results for input(s): DDIMER in the last 168 hours.   Radiology    No results found.  Cardiac Studies   03/05/18 cath  2nd Mrg lesion is 65% stenosed.    Ischemic cardiomyopathy, with acute on chronic systolic heart failure.  Angiographic estimation of EF is 10-20%.  Severely elevated LVEDP and moderately elevated pulmonary capillary wedge pressure.  Severe occlusive native vessel disease with occlusion of the proximal to mid LAD, total occlusion of the proximal to mid circumflex, and total occlusion of the mid RCA.  Bypass graft occlusive disease with total occlusion of the SVG to RCA and SVG to diagonal.  Patent LIMA to the LAD with severe diffuse disease in the LAD distal to the graft insertion site.  Patent free radial to the obtuse marginal with 60-70% distal anastomosis stenosis.  RECOMMENDATIONS:   Aggressive guideline directed therapy for systolic left ventricular dysfunction.  Advanced heart failure therapies should be considered.  Interventional/revascularization options are nil.  Patient  Profile     57 y.o.malewith PMH of CAD s/p CABG 4v (2005) with 2/4 patent graftsin 2013, ICM/chronic systolic CHF (EF 16%40% in 2013 but down this adm), HTN, HL, GERD, possible Barrett's esophagus, esophageal dysmotilitywho presented with cough and chest pain.    Assessment & Plan    1. CAD - history of prior CABG - cath yesterday as reported below. Severe occlusive native disease (occluded LAD, LCX,RCA). SVG-RCA occluded, SVG-diag occluded. Patent LIMA-LAD with severe diffuse disease in the LAD distal to graft inserstion. Patent radial to OM with 60-70% distal anastomosis stenosis. No real revasc options per cath note - medical therapy with ASA, coreg, effient, pravastatin,entresto - post cath labs are stable  2. Systolic HF - 02/2018 echo LVEF 20-25%, restrictive diastolic function.  - RHC CI 2, mean PA 31, PCWP 22, LVEDP 16 - medical therapy with coreg, entresto. Soft bp's at times, will not tirate today.  - elevated filling pressures by cath. [facility] got las[facility]. Will start IV 40mg  daily.  - recs for lifevest prior to discharge per Dr Tresa EndoKelly  3. Hyperlipidemia - does not tolerate more potent statins. On pravastatin and zetia.   4. OSA screen - needs outpatient testing For questions or updates, please contact [facility] Please consult [email or web address] for contact info under Cardiology/STEMI.      Joanie CoddingtonSigned, Claire Bridge, MD  03/06/2018, 9:55 AM

## 2018-03-06 NOTE — Telephone Encounter (Signed)
I see that he was admitted - have reviewed cath report - tried to touch base with Dr. Tresa EndoKelly -- will follow along.  Bryan Lemmaavid Harding, MD

## 2018-03-07 LAB — BASIC METABOLIC PANEL
ANION GAP: 11 (ref 5–15)
BUN: 11 mg/dL (ref 6–20)
CALCIUM: 9 mg/dL (ref 8.9–10.3)
CO2: 21 mmol/L — ABNORMAL LOW (ref 22–32)
Chloride: 104 mmol/L (ref 101–111)
Creatinine, Ser: 1.12 mg/dL (ref 0.61–1.24)
GLUCOSE: 95 mg/dL (ref 65–99)
POTASSIUM: 4.2 mmol/L (ref 3.5–5.1)
SODIUM: 136 mmol/L (ref 135–145)

## 2018-03-07 LAB — CBC
HEMATOCRIT: 41.2 % (ref 39.0–52.0)
HEMOGLOBIN: 13.8 g/dL (ref 13.0–17.0)
MCH: 28.2 pg (ref 26.0–34.0)
MCHC: 33.5 g/dL (ref 30.0–36.0)
MCV: 84.3 fL (ref 78.0–100.0)
Platelets: 205 10*3/uL (ref 150–400)
RBC: 4.89 MIL/uL (ref 4.22–5.81)
RDW: 14.4 % (ref 11.5–15.5)
WBC: 7.5 10*3/uL (ref 4.0–10.5)

## 2018-03-07 LAB — MAGNESIUM: Magnesium: 1.9 mg/dL (ref 1.7–2.4)

## 2018-03-07 MED ORDER — SACUBITRIL-VALSARTAN 24-26 MG PO TABS: 1.0000 | ORAL_TABLET | Freq: Two times a day (BID) | ORAL | 2 refills | Status: DC

## 2018-03-07 MED ORDER — FUROSEMIDE 20 MG PO TABS: 20.0000 mg | ORAL_TABLET | Freq: Every day | ORAL | 0 refills | Status: DC

## 2018-03-07 MED ORDER — FUROSEMIDE 20 MG PO TABS: 20.0000 mg | ORAL_TABLET | Freq: Every day | ORAL | Status: DC

## 2018-03-07 MED ORDER — CARVEDILOL 3.125 MG PO TABS: 3.1250 mg | ORAL_TABLET | Freq: Two times a day (BID) | ORAL | 0 refills | Status: DC

## 2018-03-07 MED ORDER — ASPIRIN 81 MG PO CHEW: 81.0000 mg | CHEWABLE_TABLET | Freq: Every day | ORAL | 11 refills | Status: DC

## 2018-03-07 MED ORDER — EZETIMIBE 10 MG PO TABS
10.0000 mg | ORAL_TABLET | Freq: Every day | ORAL | 0 refills | Status: DC
Start: 2018-03-07 — End: 2018-03-30

## 2018-03-07 NOTE — Discharge Instructions (Signed)
Radial Site Care Refer to this sheet in the next few weeks. These instructions provide you with information about caring for yourself after your procedure. Your health care provider may also give you more specific instructions. Your treatment has been planned according to current medical practices, but problems sometimes occur. Call your health care provider if you have any problems or questions after your procedure. What can I expect after the procedure? After your procedure, it is typical to have the following:  Bruising at the radial site that usually fades within 1-2 weeks.  Blood collecting in the tissue (hematoma) that may be painful to the touch. It should usually decrease in size and tenderness within 1-2 weeks.  Follow these instructions at home:  Take medicines only as directed by your health care provider.  You may shower 24-48 hours after the procedure or as directed by your health care provider. Remove the bandage (dressing) and gently wash the site with plain soap and water. Pat the area dry with a clean towel. Do not rub the site, because this may cause bleeding.  Do not take baths, swim, or use a hot tub until your health care provider approves.  Check your insertion site every day for redness, swelling, or drainage.  Do not apply powder or lotion to the site.  Do not flex or bend the affected arm for 24 hours or as directed by your health care provider.  Do not push or pull heavy objects with the affected arm for 24 hours or as directed by your health care provider.  Do not lift over 10 lb (4.5 kg) for 5 days after your procedure or as directed by your health care provider.  Ask your health care provider when it is okay to: ? Return to work or school. ? Resume usual physical activities or sports. ? Resume sexual activity.  Do not drive home if you are discharged the same day as the procedure. Have someone else drive you.  You may drive 24 hours after the procedure  unless otherwise instructed by your health care provider.  Do not operate machinery or power tools for 24 hours after the procedure.  If your procedure was done as an outpatient procedure, which means that you went home the same day as your procedure, a responsible adult should be with you for the first 24 hours after you arrive home.  Keep all follow-up visits as directed by your health care provider. This is important. Contact a health care provider if:  You have a fever.  You have chills.  You have increased bleeding from the radial site. Hold pressure on the site. Get help right away if:  You have unusual pain at the radial site.  You have redness, warmth, or swelling at the radial site.  You have drainage (other than a small amount of blood on the dressing) from the radial site.  The radial site is bleeding, and the bleeding does not stop after 30 minutes of holding steady pressure on the site.  Your arm or hand becomes pale, cool, tingly, or numb. This information is not intended to replace advice given to you by your health care provider. Make sure you discuss any questions you have with your health care provider. Document Released: 01/17/2011 Document Revised: 05/22/2016 Document Reviewed: 07/03/2014 Elsevier Interactive Patient Education  2018 [employer].    Cooking With Less [employer] with less salt is one way to reduce the amount of sodium you get from food. Depending on  your condition and overall health, your health care provider or diet and nutrition specialist (dietitian) may recommend that you reduce your sodium intake. Most people should have less than 2,300 milligrams (mg) of sodium each day. If you have high blood pressure (hypertension), you may need to limit your sodium to 1,500 mg each day. Follow the tips below to help reduce your sodium intake. What do I need to know about cooking with less salt? Shopping  Buy sodium-free or low-sodium products. Look  for the following words on food labels: ? Low-sodium. ? Sodium-free. ? Reduced-sodium. ? No salt added. ? Unsalted.  Buy fresh or frozen vegetables. Avoid canned vegetables.  Avoid buying meats or protein foods that have been injected with broth or saline solution.  Avoid cured or smoked meats, such as hot dogs, bacon, salami, ham, and bologna. Reading food labels  Check the food label before buying or using packaged ingredients.  Look for products with no more than 140 mg of sodium in one serving.  Do not choose foods with salt as one of the first three ingredients on the ingredients list. If salt is one of the first three ingredients, it usually means the item is high in sodium, because ingredients are listed in order of amount in the food item. Cooking  Use herbs, seasonings without salt, and spices as substitutes for salt in foods.  Use sodium-free baking soda when baking.  Grill, braise, or roast foods to add flavor with less salt.  Avoid adding salt to pasta, rice, or hot cereals while cooking.  Drain and rinse canned vegetables before use.  Avoid adding salt when cooking sweets and desserts.  Cook with low-sodium ingredients. What are some salt alternatives? The following are herbs, seasonings, and spices that can be used instead of salt to give taste to your food. Herbs should be fresh or dried. Do not choose packaged mixes. Next to the name of the herb, spice, or seasoning are some examples of foods you can pair it with. Herbs  Bay leaves - Soups, meat and vegetable dishes, and spaghetti sauce.  Basil - [employer] In[place]alian dishes, soups, pasta, and fish dishes.  Cilantro - Meat, poultry, and vegetable dishes.  Chili powder - Marinades and Mexican dishes.  Chives - Salad dressings and potato dishes.  Cumin - Mexican dishes, couscous, and meat dishes.  Dill - Fish dishes, sauces, and salads.  Fennel - Meat and vegetable dishes, breads, and cookies.  Garlic (do not use  garlic salt) - [place] dishes, meat dishes, salad dressings, and sauces.  Marjoram - Soups, potato dishes, and meat dishes.  Oregano - Pizza and spaghetti sauce.  Parsley - Salads, soups, pasta, and meat dishes.  Rosemary - [place] dishes, salad dressings, soups, and red meats.  Saffron - Fish dishes, pasta, and some poultry dishes.  Sage - Stuffings and sauces.  Tarragon - Fish and Whole Foodspoultry dishes.  Thyme - Stuffing, meat, and fish dishes. Seasonings  Lemon juice - Fish dishes, poultry dishes, vegetables, and salads.  Vinegar - Salad dressings, vegetables, and fish dishes. Spices  Cinnamon - Sweet dishes, such as cakes, cookies, and puddings.  Cloves - Gingerbread, puddings, and marinades for meats.  Curry - Vegetable dishes, fish and poultry dishes, and stir-fry dishes.  Ginger - Vegetables dishes, fish dishes, and stir-fry dishes.  Nutmeg - Pasta, vegetables, poultry, fish dishes, and custard. What are some low-sodium ingredients and foods?  Fresh or frozen fruits and vegetables with no sauce added.  Fresh or frozen whole  meats, poultry, and fish with no sauce added.  Eggs.  Noodles, pasta, quinoa, rice.  Shredded or puffed wheat or puffed rice.  Regular or quick oats.  Milk, yogurt, hard cheeses, and low-sodium cheeses. Good cheese choices include Swiss, [employer], and [address]. Always check the label for the serving size and sodium content.  Unsalted butter or margarine.  Unsalted nuts.  Sherbet or ice cream (keep to  cup per serving).  Homemade pudding.  Sodium-free baking soda and baking powder. This is not a complete list of low-sodium ingredients and foods. Contact your dietitian for more options. Summary  Cooking with less salt is one way to reduce the amount of sodium that you get from food.  Buy sodium-free or low-sodium products.  Check the food label before using or buying packaged ingredients.  Use herbs, seasonings without salt,  and spices as substitutes for salt in foods. This information is not intended to replace advice given to you by your health care provider. Make sure you discuss any questions you have with your health care provider. Document Released: 12/15/2005 Document Revised: 12/23/2016 Document Reviewed: 12/23/2016 Elsevier Interactive Patient Education  2017 [employer].

## 2018-03-07 NOTE — Care Management Note (Signed)
Case Management Note  Patient Details  Name: RANULFO KALL MRN: [id number] Date of Birth: March 12, 1961  Subjective/Objective:  57 yo M with hx of CAD, s/p 4-vessel CABG in 2005, ICM, HTN, and HLD who presented with cough and chest pain.               Action/Plan: Received referral to provide discount card for [facility]   Expected Discharge Date:  03/07/18               Expected Discharge Plan:  Home/Self Care  In-House Referral:     Discharge planning Services  CM Consult  Post Acute Care Choice:    Choice offered to:     DME Arranged:    DME Agency:     HH Arranged:    HH Agency:     Status of Service:  Completed, signed off  If discussed at [employer] of Stay Meetings, dates discussed:    Additional Comments: Met with pt at beside. He has insurance coverage for prescriptions. Provided pt with a 30 day free card and a $10 copay card for [facility]. He denies any D/C needs.  Norina Buzzard, RN 03/07/2018, 10:28 AM

## 2018-03-07 NOTE — Progress Notes (Signed)
    Patient for discharge 03/07/18 pending LifeVest. Discharge summary and medication reconciliation done. Discharge order placed with instructions to hold discharge until LifeVest has been placed. Patient will be contacted by our office for TOC follow up.

## 2018-03-07 NOTE — Discharge Summary (Addendum)
Discharge Summary    Patient ID: Calvin LangDavid W Luevanos  MRN: [id number], DOB/AGE: 56-04-1961 56 y.o.  Admit Date: 03/02/2018 Discharge Date: 03/07/2018  Primary Care Provider: Eustaquio BoydenGutierrez, Javier, MD Primary Cardiologist: Dr. Herbie BaltimoreHarding, MD  Discharge Diagnoses    Principal Problem:   Chest pain Active Problems:   Stable angina: Class I-II   S/P CABG x 4   Severe obesity (BMI 35.0-39.9) with comorbidity (HCC)   Obstructive sleep apnea of adult   Elevated troponin I level   Congestive heart failure (HCC)   Non-STEMI (non-ST elevated myocardial infarction) (HCC)   Allergies Allergies  Allergen Reactions  . Statins   . Zetia [Ezetimibe]   . Clopidogrel Other (See Comments)    Disoriented   . Codeine Rash     History of Present Illness     57 year old male with history of CAD s/p 4-vessel CABG in 2005, ICM, HTN, and HLD who presented with cough and chest pain.   Cath in 2013 showed patent LIMA-LAD, and graft to OM2 with occluded SVG-OM1 and SVG-RCA. Echo from 11/13 showed EF of 40% with mod posterior and inferior wall hypokinesis with LVH. His lipids have been difficult to control as he has been intolerant to statins with myalgias. Attempted PCSK9s but to costly in the past. He was last seen in the office on 9/18 and reported ongoing anginal pain more so than usual. Felt his activity level was declining. Not able to do much activity in very hot or cold weather and having increasing fatigue. His Ranexa was increased and added low dose Bisoprolol as he had been intolerant to BB therapy in the past 2/2 to fatigue. Reported having a bad episode of chest pain back in December when the snow storm came through. Was outside pushing snow off a dog house and had severe chest pain with shortness of breath. Symptoms lingered over several days but he did not come to the ED for this. Had a URI around this same time. Ad[facility] to [facility] with worsening SOB over the week prior. In the ED his labs showed  stable electrolytes, Cr 1.14, Trop 0.20, Hgb 12.3. CXR was negative but CTA showed concern for multi lobe PNA.   Hospital Course     Consultants: care management    Dyspnea/chest pain/multivessel CAD: Echo on 3/6 showed an EF of 20-25%, diffuse hypokinesis, DD, mild mitral regurgitation, mildly dilated left atrium, mildly dilated RV with mild reduction of RVSF, mildly dilated right atrium. Troponin peaked at 0.20, and was down trending. He was placed on heparin gtt. He underwent LHC on 03/05/18 that showed severe occlusive native vessel disease with occlusion of the proximal to mid LAD, total occlusion of the proximal to mid LCx, and total occlusion of the mid RCA. Bypass angiography showed total occlusion of the SVG-RCA and SVG-diagonal with patent LIMA-LAD with severe diffuse disease in the LAD distal to the graft insertion site. Patent free radial to the OM with 60-70% distal anastomosis stenosis. EF 10-20% with severely elevated LVEDP and moderately elevated PCWP. No good revascularization options were noted. Medical therapy was advised with ASA, Effient, pravastatin, Coreg, Entresto, and Ranexa.   ICM: EF 10 - 15% at cath; LVEDP 26mm; PA systolic pressure 45 mm. Initially placed on losartan, [facility]. He was diuresed with IV Lasix given his elevated filling pressures and diuresed 1 L on 3/9. He was noted to have mild orthostatic symptoms with BP soft at times which precluded titration of his medications prior  to discharge. He will continue medical therapy with Coreg, Entresto, and Lasix. He is not on spironolactone given his CKD. He has been approved for a LifeVest and this will be fitted to him prior to discharge on 03/07/18. Discharge weight of 238 pounds.   CKD stage III: Discharge SCr 1.12.   HLD: Prior to this admission, he was on low-dose pravastatin 20 mg daily and he is noted to be intolerant to other statins. He has previously attempted LCSK9 inhibitor, those this was cost  prohibitive. His pravastatin was increased to 40 mg daily and Zetia 10 mg daily was added. He should consider revisiting PCSK9 inhibitor as an outpatient.   Possible OSA: Outpatient follow up.   The patient's cardiac cath site has been examined is healing well without issues at this time. The patient has been seen by Dr. Wyline Mood, MD and felt to be stable for discharge today. All follow up appointments have been made. Discharge medications are listed below. Prescriptions have been reviewed with the patient and sent in to their pharmacy.  _____________  Discharge Vitals Blood pressure (!) 138/93, pulse 71, temperature 98.1 F (36.7 C), temperature source Oral, resp. rate 19, height 6\' 2"  (1.88 m), weight 238 lb 5.1 oz (108.1 kg), SpO2 96 %.  Filed Weights   03/05/18 0500 03/06/18 0558 03/07/18 0423  Weight: 236 lb 6.4 oz (107.2 kg) 237 lb 8 oz (107.7 kg) 238 lb 5.1 oz (108.1 kg)    Labs & Radiologic Studies    CBC Recent Labs    03/06/18 0455 03/07/18 0818  WBC 6.2 7.5  HGB 12.0* 13.8  HCT 36.2* 41.2  MCV 85.4 84.3  PLT 188 205   Basic Metabolic Panel Recent Labs    16/10/96 0608 03/06/18 0455  NA 138 137  K 4.3 4.2  CL 104 105  CO2 24 22  GLUCOSE 114* 131*  BUN 10 9  CREATININE 1.33* 1.19  CALCIUM 9.0 8.9   Liver Function Tests No results for input(s): AST, ALT, ALKPHOS, BILITOT, PROT, ALBUMIN in the last 72 hours. No results for input(s): LIPASE, AMYLASE in the last 72 hours. Cardiac Enzymes No results for input(s): CKTOTAL, CKMB, CKMBINDEX, TROPONINI in the last 72 hours. BNP Invalid input(s): POCBNP D-Dimer No results for input(s): DDIMER in the last 72 hours. Hemoglobin A1C No results for input(s): HGBA1C in the last 72 hours. Fasting Lipid Panel No results for input(s): CHOL, HDL, LDLCALC, TRIG, CHOLHDL, LDLDIRECT in the last 72 hours. Thyroid Function Tests No results for input(s): TSH, T4TOTAL, T3FREE, THYROIDAB in the last 72 hours.  Invalid input(s):  FREET3 _____________  Dg Chest 2 View  Result Date: 03/02/2018 CLINICAL DATA:  Shortness of breath with intermittent chest pain. EXAM: CHEST  2 VIEW COMPARISON:  12/27/2017 FINDINGS: Sternotomy wires unchanged. Lordotic technique is demonstrated. Lungs are adequately inflated without focal airspace consolidation or effusion. Stable cardiomegaly. Remainder of the exam is unchanged. IMPRESSION: No acute cardiopulmonary disease. Stable cardiomegaly. Electronically Signed   By: Elberta Fortis M.D.   On: 07-17-202019 08:38   Ct Angio Chest Pe W/cm &/or Wo Cm  Result Date: 03/02/2018 CLINICAL DATA:  Shortness of breath and chest pain. Known coronary artery disease. PE suspected, high pretest probability. EXAM: CT ANGIOGRAPHY CHEST WITH CONTRAST TECHNIQUE: Multidetector CT imaging of the chest was performed using the standard protocol during bolus administration of intravenous contrast. Multiplanar CT image reconstructions and MIPs were obtained to evaluate the vascular anatomy. CONTRAST:  ISOVUE-370 IOPAMIDOL (ISOVUE-370) INJECTION 76% COMPARISON:  Two-view chest x-ray 2020/04/2018 FINDINGS: Cardiovascular: Heart is enlarged. Coronary artery calcifications are present. Stents are in place. The patient is status post median sternotomy for CABG. Atherosclerotic calcifications are present at the aortic arch. Pulmonary artery opacification is excellent. The no focal filling defects are present to suggest pulmonary emboli. Mediastinum/Nodes: Subcentimeter right paratracheal lymph nodes are present. Prevascular nodes measure up to 11 mm. No significant hilar or axillary adenopathy is present. Lungs/Pleura: Patchy ground-glass airspace disease is present in the lower lobes bilaterally, left greater than right. There is some dependent atelectasis. Interlobular septal thickening is present. Small effusions are noted bilaterally. Upper Abdomen: Limited imaging of the upper abdomen is unremarkable. Musculoskeletal: Mild  endplate changes are present at T10-11 and T11-12. Vertebral body heights and alignment are normal. No focal lytic or blastic lesions are present. Ribs are within normal limits bilaterally. Review of the MIP images confirms the above findings. IMPRESSION: 1. No pulmonary embolus. 2. Cardiomegaly with interstitial edema and small effusions compatible with congestive heart failure. 3. Patchy ground-glass airspace opacities in the lower lobes bilaterally consistent with multi lobar pneumonia. Electronically Signed   By: Marin Roberts M.D.   On: 2020/04/2018 13:07    Diagnostic Studies/Procedures   TTE 03/03/2018: Study Conclusions  - Left ventricle: The cavity size was severely dilated. Wall   thickness was increased in a pattern of mild LVH. Systolic   function was severely reduced. The estimated ejection fraction   was in the range of 20% to 25%. Diffuse hypokinesis. Doppler   parameters are consistent with restrictive physiology, indicative   of decreased left ventricular diastolic compliance and/or   increased left atrial pressure. Doppler parameters are consistent   with high ventricular filling pressure. - Mitral valve: There was mild regurgitation. - Left atrium: The atrium was mildly dilated. - Right ventricle: The cavity size was mildly dilated. Systolic   function was mildly reduced. - Right atrium: The atrium was mildly dilated.  Impressions:  - Severe global reduction in LV systolic function; restrictive   filling; mild LVH; severe LVE; mild MR; mild biatrial   enlargement; mild RVE with mildly reduced RV function.   R/LHC 03/05/2018: Coronary Findings   Diagnostic  Dominance: Right  Left Main  Dist LM to Ost LAD lesion 80% stenosed  Dist LM to Ost LAD lesion is 80% stenosed.  Left Anterior Descending  Prox LAD lesion 100% stenosed  Prox LAD lesion is 100% stenosed.  Mid LAD lesion 85% stenosed  Mid LAD lesion is 85% stenosed.  Mid LAD to Dist LAD lesion 80%  stenosed  Mid LAD to Dist LAD lesion is 80% stenosed.  Dist LAD lesion 90% stenosed  Dist LAD lesion is 90% stenosed.  First Diagonal Faaris Arizpe  Vessel is small in size.  Left Circumflex  Ost Cx to Prox Cx lesion 99% stenosed  Ost Cx to Prox Cx lesion is 99% stenosed.  Prox Cx lesion 100% stenosed  Prox Cx lesion is 100% stenosed.  Second Obtuse Marginal Jerri Glauser  Ost 2nd Mrg lesion 100% stenosed  Ost 2nd Mrg lesion is 100% stenosed.  2nd Mrg lesion 65% stenosed  2nd Mrg lesion is 65% stenosed.  Right Coronary Artery  Prox RCA lesion 99% stenosed  Prox RCA lesion is 99% stenosed.  Mid RCA lesion 100% stenosed  Mid RCA lesion is 100% stenosed.  Dist RCA lesion 100% stenosed  Dist RCA lesion is 100% stenosed.  Right Posterior Atrioventricular Khale Nigh  Collaterals  Post Atrio filled by collaterals from 2nd  Mrg.    LIMA Graft to Mid LAD  Graft to Dist RCA  Origin to Prox Graft lesion 100% stenosed  Origin to Prox Graft lesion is 100% stenosed.  Graft to 2nd Mrg  Graft to 1st Diag  Prox Graft lesion 100% stenosed  Prox Graft lesion is 100% stenosed.  Intervention   No interventions have been documented.  Right Heart   Right Heart Pressures Hemodynamic findings consistent with mild pulmonary hypertension. Elevated LV EDP consistent with volume overload.  Right Atrium Right atrial pressure is elevated.  Wall Motion   Resting               Left Heart   Left Ventricle There is severe left ventricular systolic dysfunction. LV end diastolic pressure is severely elevated. Calculated EF is 15%. The left ventricular ejection fraction is less than 25% by visual estimate. There are LV function abnormalities due to global hypokinesis.  Coronary Diagrams   Diagnostic Diagram        Conclusion     2nd Mrg lesion is 65% stenosed.    Ischemic cardiomyopathy, with acute on chronic systolic heart failure.  Angiographic estimation of EF is 10-20%.  Severely elevated LVEDP and  moderately elevated pulmonary capillary wedge pressure.  Severe occlusive native vessel disease with occlusion of the proximal to mid LAD, total occlusion of the proximal to mid circumflex, and total occlusion of the mid RCA.  Bypass graft occlusive disease with total occlusion of the SVG to RCA and SVG to diagonal.  Patent LIMA to the LAD with severe diffuse disease in the LAD distal to the graft insertion site.  Patent free radial to the obtuse marginal with 60-70% distal anastomosis stenosis.  RECOMMENDATIONS:   Aggressive guideline directed therapy for systolic left ventricular dysfunction.  Advanced heart failure therapies should be considered.  Interventional/revascularization options are nil.    _____________  Disposition   Pt is being discharged home today in good condition.  Follow-up Plans & Appointments    Follow-up Information    [facility]. Schedule an appointment as soon as possible for a visit.   Specialty:  Cardiology Why:  our office will contact the patient for an appointment to be seen within 14 days of hospital discharge.  Contact information: [address] [place] [address] [phone]         Discharge Instructions    Diet - low sodium heart healthy   Complete by:  As directed    Increase activity slowly   Complete by:  As directed       Discharge Medications   No current facility-administered medications for this encounter.   Current Outpatient Medications:  .  albuterol (PROVENTIL HFA;VENTOLIN HFA) 108 (90 BASE) MCG/ACT inhaler, Inhale 2 puffs into the lungs every 4 (four) hours as needed for wheezing or shortness of breath., Disp: 1 Inhaler, Rfl: 0 .  ALPRAZolam (XANAX) 0.25 MG tablet, Take 1 tablet (0.25 mg total) by mouth 2 (two) times daily as needed for anxiety., Disp: 60 tablet, Rfl: 3 .  co-enzyme Q-10 30 MG capsule, Take 1 capsule (30 mg total) by mouth daily. (Patient taking  differently: Take 30 mg by mouth 2 (two) times daily. ), Disp: , Rfl:  .  COLCRYS 0.6 MG tablet, TAKE AS DIRECTED 2 TABLETS BY MOUTH ON FIRST DAY THEN ONE DAILY AS NEEDED, Disp: 30 tablet, Rfl: 3 .  fluorouracil (EFUDEX) 5 % cream, Apply topically 2 (two) times daily., Disp: 40 g, Rfl:  1 .  fluticasone (FLONASE) 50 MCG/ACT nasal spray, Place 2 sprays into both nostrils daily. (Patient taking differently: Place 2 sprays into both nostrils as needed for allergies. ), Disp: 16 g, Rfl: 1 .  nitroGLYCERIN (NITROSTAT) 0.4 MG SL tablet, Place 1 tablet (0.4 mg total) under the tongue every 5 (five) minutes as needed for chest pain., Disp: 25 tablet, Rfl: 0 .  omeprazole (PRILOSEC OTC) 20 MG tablet, Take 20 mg by mouth daily., Disp: , Rfl:  .  prasugrel (EFFIENT) 10 MG TABS tablet, TAKE 1 TABLET EVERY DAY, Disp: 90 tablet, Rfl: 3 .  pravastatin (PRAVACHOL) 40 MG tablet, Take 1 tablet (40 mg total) by mouth daily. (Patient taking differently: Take 40 mg by mouth every other day. Pt states he doesn't take every day), Disp: 90 tablet, Rfl: 3 .  ranolazine (RANEXA) 1000 MG SR tablet, Take 1 tablet (1,000 mg total) by mouth twice daily, as directed., Disp: 180 tablet, Rfl: 3 .  vitamin B-12 (CYANOCOBALAMIN) 500 MCG tablet, Take 500 mcg by mouth daily., Disp: , Rfl:  .  aspirin 81 MG chewable tablet, Chew 1 tablet (81 mg total) by mouth daily., Disp: 30 tablet, Rfl: 11 .  carvedilol (COREG) 3.125 MG tablet, Take 1 tablet (3.125 mg total) by mouth 2 (two) times daily with a meal., Disp: 60 tablet, Rfl: 0 .  diclofenac sodium (VOLTAREN) 1 % GEL, Apply 2 g topically 3 (three) times daily., Disp: 100 g, Rfl: 1 .  ezetimibe (ZETIA) 10 MG tablet, Take 1 tablet (10 mg total) by mouth daily., Disp: 30 tablet, Rfl: 0 .  furosemide (LASIX) 20 MG tablet, Take 1 tablet (20 mg total) by mouth daily., Disp: 30 tablet, Rfl: 0 .  sacubitril-valsartan (ENTRESTO) 24-26 MG, Take 1 tablet by mouth 2 (two) times daily., Disp: 60 tablet,  Rfl: 2     Aspirin prescribed at discharge?  Yes High Intensity Statin Prescribed? (Lipitor 40-80mg  or Crestor 20-40mg ): No: Intolerant  Beta Blocker Prescribed? Yes For EF <40%, was ACEI/ARB Prescribed? Yes ADP Receptor Inhibitor Prescribed? (i.e. Plavix etc.-Includes Medically Managed Patients): Yes For EF <40%, Aldosterone Inhibitor Prescribed? No: CKD Was EF assessed during THIS hospitalization? Yes Was Cardiac Rehab II ordered? (Included Medically managed Patients): Yes   Outstanding Labs/Studies   None.   Duration of Discharge Encounter   Greater than 30 minutes including physician time.  Signed, Sondra Barges, PA-C [facility] Pager: [phone] 03/07/2018, 8:54 AM

## 2018-03-07 NOTE — Progress Notes (Signed)
Progress Note  Patient Name: Calvin Francis Date of Encounter: 03/07/2018  Primary Cardiologist: Bryan Lemma, MD   Subjective   No complaints.   Inpatient Medications    Scheduled Meds: . aspirin  81 mg Oral Daily  . carvedilol  3.125 mg Oral BID WC  . enoxaparin (LOVENOX) injection  40 mg Subcutaneous Q24H  . ezetimibe  10 mg Oral Daily  . prasugrel  10 mg Oral Daily  . pravastatin  40 mg Oral Daily  . ranolazine  1,000 mg Oral BID  . sacubitril-valsartan  1 tablet Oral BID  . sodium chloride flush  3 mL Intravenous Q12H   Continuous Infusions: . sodium chloride     PRN Meds: sodium chloride, acetaminophen, albuterol, ALPRAZolam, guaiFENesin-dextromethorphan, nitroGLYCERIN, ondansetron (ZOFRAN) IV, sodium chloride flush   Vital Signs    Vitals:   03/06/18 1240 03/06/18 1723 03/06/18 2054 03/07/18 0423  BP: 103/73 (!) 113/95 93/63 116/75  Pulse: 67  73 68  Resp:      Temp: 97.7 F (36.5 C)  97.7 F (36.5 C) 98.1 F (36.7 C)  TempSrc: Oral  Oral Oral  SpO2:   99% 96%  Weight:    238 lb 5.1 oz (108.1 kg)  Height:        Intake/Output Summary (Last 24 hours) at 03/07/2018 0755 Last data filed at 03/06/2018 2330 Gross per 24 hour  Intake 680 ml  Output 1700 ml  Net -1020 ml   Filed Weights   03/05/18 0500 03/06/18 0558 03/07/18 0423  Weight: 236 lb 6.4 oz (107.2 kg) 237 lb 8 oz (107.7 kg) 238 lb 5.1 oz (108.1 kg)    Telemetry    NSR - Personally Reviewed  ECG    na  Physical Exam   GEN: No acute distress.   Neck: No JVD Cardiac: RRR, no murmurs, rubs, or gallops.  Respiratory: Clear to auscultation bilaterally. GI: Soft, nontender, non-distended  MS: No edema; No deformity. Neuro:  Nonfocal  Psych: Normal affect   Labs    Chemistry Recent Labs  Lab 03/02/18 0804 03/03/18 0841 03/05/18 0608 03/06/18 0455  NA 138 139 138 137  K 4.3 4.1 4.3 4.2  CL 106 103 104 105  CO2 23 25 24 22   GLUCOSE 100* 106* 114* 131*  BUN 8 7 10 9     CREATININE 1.14 1.25* 1.33* 1.19  CALCIUM 9.1 9.1 9.0 8.9  PROT 6.9  --   --   --   ALBUMIN 4.1  --   --   --   AST 21  --   --   --   ALT 19  --   --   --   ALKPHOS 58  --   --   --   BILITOT 0.6  --   --   --   GFRNONAA >60 >60 59* >60  GFRAA >60 >60 >60 >60  ANIONGAP 9 11 10 10      Hematology Recent Labs  Lab 03/04/18 0529 03/05/18 0608 03/06/18 0455  WBC 5.7 6.5 6.2  RBC 4.07* 4.25 4.24  HGB 11.4* 11.9* 12.0*  HCT 34.5* 35.9* 36.2*  MCV 84.8 84.5 85.4  MCH 28.0 28.0 28.3  MCHC 33.0 33.1 33.1  RDW 14.3 14.3 14.6  PLT 179 195 188    Cardiac Enzymes Recent Labs  Lab 03/02/18 0804 03/02/18 2017 03/03/18 0215 03/03/18 0841  TROPONINI 0.20* 0.19* 0.18* 0.13*   No results for input(s): TROPIPOC in the last 168 hours.  BNP Recent Labs  Lab 03/04/18 0529 03/05/18 0608  BNP 973.5* 1,026.3*     DDimer No results for input(s): DDIMER in the last 168 hours.   Radiology    No results found.  Cardiac Studies     Patient Profile     57 y.o.malewith PMH of CAD s/p CABG 4v (2005) with 2/4 patent graftsin 2013, ICM/chronic systolic CHF (EF 16%40% in 2013 but down this adm), HTN, HL, GERD, possible Barrett's esophagus, esophageal dysmotilitywho presented with cough and chest pain.    Assessment & Plan    1. CAD - history of prior CABG - cath yesterday as reported below. Severe occlusive native disease (occluded LAD, LCX,RCA). SVG-RCA occluded, SVG-diag occluded. Patent LIMA-LAD with severe diffuse disease in the LAD distal to graft inserstion. Patent radial to OM with 60-70% distal anastomosis stenosis. No real revasc options per cath note - medical therapy with ASA, coreg, effient, pravastatin,entresto   2. Systolic HF - 02/2018 echo LVEF 20-25%, restrictive diastolic function.  - RHC CI 2, mean PA 31, PCWP 22, LVEDP 16 - medical therapy with coreg, entresto. Soft bp's at times, will not tirate today.  - elevated filling pressures by cath. Received  40mg  of IV lasix yesterday, negative 1 liter. Labs pending this AM. Mild orthostatic symptoms, change to lasix 20mg  po daily starting tomorrow.  - recs for lifevest prior to discharge per Dr Tresa EndoKelly  3. Hyperlipidemia - does not tolerate more potent statins. On pravastatin and zetia.   4. OSA screen - needs outpatient testing  Probable discharge today pending labs today and obtaining lifevest  For questions or updates, please contact [facility] Please consult [email or web address] for contact info under Cardiology/STEMI.      Joanie CoddingtonSigned, Lyndzie Zentz, MD  03/07/2018, 7:55 AM

## 2018-03-09 ENCOUNTER — Telehealth: Payer: Self-pay

## 2018-03-09 NOTE — Consult Note (Signed)
            [facility] Primary [facility] Nav[facility]  03/09/2018  Piedad [facility]imesDavid W [facility] [birth year] [id number]   Attempt to seepatient at the bedsideto identify possible discharge needs but he was alreadydischarged home.  PerMD note,patient presented for chest pain and cough, had worsening shortness of breath and not able to do much activity in very hot or cold weather and having increasing fatigue.  Primary care provider's office called(Rena)to notify of patient's discharge and need for post hospital follow-up and transition of care (TOC). Notified of health issues needing follow-up as well (mainly HF).   Made aware to [facility] if deemed necessary and appropriate for services.   For additional questions please contact:  Karin GoldenLorraine A. Athleen Feltner, BSN, RN-BC [facility] PRIMARY [facility] Navigator Cell: [phone]

## 2018-03-09 NOTE — Telephone Encounter (Signed)
[facility]; stable angina, CHF; Karin GoldenLorraine said if need [facility] they are [facility]; Do you want to schedule appt to see pt; pt is to f/u with cardiology per d/c note.Please advise.

## 2018-03-09 NOTE — Telephone Encounter (Signed)
No appt here at this time as has close f/u with cards. Thanks.

## 2018-03-09 NOTE — Telephone Encounter (Signed)
Noted, Thanks

## 2018-03-24 NOTE — Progress Notes (Signed)
Cardiology Office Note   Date:  03/25/2018   ID:  Judge, Duque 05/18/1961, MRN [id number]  PCP:  Ria Bush, MD  Cardiologist: Dr. Ellyn Hack Chief Complaint  Patient presents with  . Follow-up    hospital f/u/ occassional dizziness   History of Present Illness: Calvin Francis is a 57 y.o. male who presents for posthospitalization follow-up, with known history of coronary artery disease status post four-vessel CABG in 2005, ischemic cardiomyopathy, hypertension, hyperlipidemia, who was admitted with chest pain and dyspnea.  Echocardiogram dated 03/03/2018 revealed EF of 20% to 25% with diffuse hypokinesis, diastolic dysfunction, mild mitral regurg, mildly dilated left atrium, mildly dilated RV with mild reduction of RVSP, mildly dilated right atrium.  During hospitalization the patient's troponin peaked at 0.20.  The patient ultimately underwent left heart cath on 03/15/2018.  This revealed severe occlusive native vessel disease with occlusion of the proximal to mid LAD, total occlusion of the proximal to mid circumflex,and total occlusion of the mid RCA. Bypass angiography showed total occlusion of the SVG-RCA and SVG-diagonal with patent LIMA-LAD with severe diffuse disease in the LAD distal to the graft insertion site. Patent free radial to the OM with 60-70% distal anastomosis stenosis.  LVEF was found to be 10-20%, severely elevated LVEDP with moderately elevated PCWP.  There were no good revascularization options and medical therapy was advised, this included aspirin, Effient, pravastatin, carvedilol, Entresto, and Ranexa.  The patient was given IV Lasix for diuresis and diuresed 1 L.  He was found to have mild orthostatic symptoms soft BP which precluded titration of medications prior to discharge.  The patient had a LifeVest approved prior to discharge and was placed.  His pravastatin was increased to 40 mg daily and Zetia 10 mg daily was added.  Consideration for PCKS 9 inhibition  was discussed.  He is here today very grateful to his physicians for relieving his symptoms and for placing him on correct medications.  He is having trouble affording Entresto, and has had some samples and 30-day supply provided for him posthospitalization.  He is weighing himself daily and his weight has not fluctuated more than 2 pounds.  He is avoiding salt.  He denies palpitations dizziness or worsening dyspnea.  He would like to have the LifeVest removed as soon as possible.  He understands its necessity at this time.  Past Medical History:  Diagnosis Date  . 3-vessel CAD 02/22/2004   mid RCA 100% occluded, L-R collaterals; LAD 60-70% bifurcation lesion; Cx proximal 70% and mid 80% after OM1, OM1 100% occluded. --> CABG x 4  . Barrett's esophagus    s/p EGD several years ago with [place]  . Bronchitis, mucopurulent recurrent ([place])   . CAD (coronary artery disease) of bypass graft 5/'12; 12/'13   Cath for angina and Abn Myoview ST: SVG-OM1 now occluded, attempt at PCI to the native circumflex OM1 unsuccessful; distal LAD beyond patent LIMA 70-80% (not PCI amenable); free radial-OM 2 patent; EF 45-50%  . Erectile dysfunction   . Former heavy cigarette smoker (20-39 per day) May 2013   . GERD (gastroesophageal reflux disease)    ?barrett's, with esophageal dysmotility, on omeprazole  . Glucose intolerance (impaired glucose tolerance)  2009   Prediabetes  . Gout   . History of Non-STEMI (non-ST elevated myocardial infarction) 02/22/2004   Three-vessel disease --> referred for CABG  . History of Non-STEMI (non-ST elevated myocardial infarction) December 2011   Hazy lesion in SVG-OM1 --> staged PCI with 4.0 mm  x 20 mm vision BMS (4.5 mm)  . HLD (hyperlipidemia)    Statin intolerant  . Hypertension, benign   . Ischemic cardiomyopathy Echo 10/2012   EF ~40%; moderate Posterior HypoKinesis, minld-moderate inferior Hypokinesis; mild RV dilation, mild concentric LVH  . 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 Stable angina ([place]) 11/2012   Chronic,some what stable but still present; cardiac cath results above, no significant change from 2012  . Testosterone deficiency    On replacement; goal is low normal.  . Varicose veins December 2013   with venous reflux status VNUS ablation of left greater saphenous wein    Past Surgical History:  Procedure Laterality Date  . CARDIAC CATHETERIZATION  May 01 2011   knowwn occlusion of SVG to RCA  as well as native RCA ; PCI to the SVG to OM1 ;patent LIMA to LAD with diffuse distal  LAD 70-80%,small  vessel dx not thought to be amenable to PCI .RCA 100% occluded ,OM2 patent,Circ diseased aftr OM1. EF 45-50%  . CORONARY ANGIOPLASTY WITH STENT PLACEMENT  12/25/2010   s/p autologous vessel blockage  . CORONARY ARTERY BYPASS GRAFT  02/27/04   LIMA -LAD, Free Radial-OM2 -- patent; SVG-OM1, SVG-RCA, - 100% occluded by recent cath  . ESOPHAGOGASTRODUODENOSCOPY    . LEFT HEART CATHETERIZATION WITH CORONARY/GRAFT ANGIOGRAM N/A 12/27/2012   Procedure: LEFT HEART CATHETERIZATION WITH Beatrix Fetters;  Surgeon: Leonie Man, MD;  Location: [facility] CATH LAB: Known nRCA, mLAD, OM1 & OM2 100%, Known SVG-RCA 100%, SVG-OM1 100% ISR. Patent LIMA-dLAD w/ 60-80% dLAD. Patent frRAD-OM2. LCx-RPL collaterals.   EF ~35%  . lower venous extremity doppler Left 08/15/2011   normal ,s/p vein ablation  . MET/CPET  11/02/2012   suboptimal effort but peak VO2  was 52% which relatively significant. Myoview was suggestive for ant. ischemia may go along with his distal LAD   . NM MYOCAR PERF WALL MOTION  12/15/2012   high risk study  . RIGHT/LEFT HEART CATH AND CORONARY/GRAFT ANGIOGRAPHY N/A 03/05/2018   Procedure: RIGHT/LEFT HEART CATH AND CORONARY/GRAFT ANGIOGRAPHY;  Surgeon: Belva Crome, MD;  Location: [facility];  Service: Cardiovascular;  Laterality: N/A;  . TRANSTHORACIC ECHOCARDIOGRAM  10/2012   mild LVH, EF 40%, mod  posterior and inf wall hypokinesis,mild concentric LVH;; RV dilated, normal  fx.trace MR  . VEIN SURGERY  2011   solomon - h/o vericose veins     Current Outpatient Medications  Medication Sig Dispense Refill  . albuterol (PROVENTIL HFA;VENTOLIN HFA) 108 (90 BASE) MCG/ACT inhaler Inhale 2 puffs into the lungs every 4 (four) hours as needed for wheezing or shortness of breath. 1 Inhaler 0  . ALPRAZolam (XANAX) 0.25 MG tablet Take 1 tablet (0.25 mg total) by mouth 2 (two) times daily as needed for anxiety. 60 tablet 3  . aspirin 81 MG chewable tablet Chew 1 tablet (81 mg total) by mouth daily. 30 tablet 11  . carvedilol (COREG) 3.125 MG tablet Take 1 tablet (3.125 mg total) by mouth 2 (two) times daily with a meal. 60 tablet 11  . co-enzyme Q-10 30 MG capsule Take 1 capsule (30 mg total) by mouth daily. (Patient taking differently: Take 30 mg by mouth 2 (two) times daily. )    . COLCRYS 0.6 MG tablet TAKE AS DIRECTED 2 TABLETS BY MOUTH ON FIRST DAY THEN ONE DAILY AS NEEDED 30 tablet 3  . ezetimibe (ZETIA) 10 MG tablet Take  1 tablet (10 mg total) by mouth daily. 30 tablet 0  . fluorouracil (EFUDEX) 5 % cream Apply topically 2 (two) times daily. 40 g 1  . fluticasone (FLONASE) 50 MCG/ACT nasal spray Place 2 sprays into both nostrils daily. (Patient taking differently: Place 2 sprays into both nostrils as needed for allergies. ) 16 g 1  . furosemide (LASIX) 20 MG tablet Take 1 tablet (20 mg total) by mouth daily. 30 tablet 11  . nitroGLYCERIN (NITROSTAT) 0.4 MG SL tablet Place 1 tablet (0.4 mg total) under the tongue every 5 (five) minutes as needed for chest pain. 25 tablet 0  . omeprazole (PRILOSEC OTC) 20 MG tablet Take 20 mg by mouth daily.    . prasugrel (EFFIENT) 10 MG TABS tablet Take 1 tablet (10 mg total) by mouth daily. 90 tablet 3  . pravastatin (PRAVACHOL) 40 MG tablet Take 1 tablet (40 mg total) by mouth daily. (Patient taking differently: Take 40 mg by mouth every other day. Pt states  he doesn't take every day) 90 tablet 3  . ranolazine (RANEXA) 1000 MG SR tablet Take 1 tablet (1,000 mg total) by mouth twice daily, as directed. 180 tablet 3  . sacubitril-valsartan (ENTRESTO) 24-26 MG Take 1 tablet by mouth 2 (two) times daily. 60 tablet 2  . vitamin B-12 (CYANOCOBALAMIN) 500 MCG tablet Take 500 mcg by mouth daily.    . digoxin (LANOXIN) 0.125 MG tablet Take 1 tablet (0.125 mg total) by mouth daily. 30 tablet 3   No current facility-administered medications for this visit.     Allergies:   Statins; Zetia [ezetimibe]; Clopidogrel; and Codeine    Social History:  The patient  reports that he quit smoking about 5 years ago. His smoking use included cigarettes. He has a 10.00 pack-year smoking history. He has never used smokeless tobacco. He reports that he drinks alcohol. He reports that he does not use drugs.   Family History:  The patient's family history includes Coronary artery disease in his paternal uncle; Diabetes in his paternal aunt; Stroke in his maternal uncle.    ROS: All other systems are reviewed and negative. Unless otherwise mentioned in H&P    PHYSICAL EXAM: VS:  BP (!) 104/58   Pulse 84   Ht 6' 2"  (1.88 m)   Wt 241 lb (109.3 kg)   BMI 30.94 kg/m  , BMI Body mass index is 30.94 kg/m. GEN: Well nourished, well developed, in no acute distress  HEENT: normal  Neck: no JVD, carotid bruits, or masses Cardiac: RRR; no murmurs, rubs, or gallops,no edema  Respiratory:  clear to auscultation bilaterally, normal work of breathing GI: soft, nontender, nondistended, + BS MS: no deformity or atrophy  Skin: warm and dry, no rash Neuro:  Strength and sensation are intact Psych: euthymic mood, full affect   EKG: Not completed today.  Recent Labs: 03/02/2018: ALT 19 03/05/2018: B Natriuretic Peptide 1,026.3 03/07/2018: BUN 11; Creatinine, Ser 1.12; Hemoglobin 13.8; Magnesium 1.9; Platelets 205; Potassium 4.2; Sodium 136    Lipid Panel    Component Value  Date/Time   CHOL 174 03/03/2018 0215   TRIG 151 (H) 03/03/2018 0215   HDL 33 (L) 03/03/2018 0215   CHOLHDL 5.3 03/03/2018 0215   VLDL 30 03/03/2018 0215   LDLCALC 111 (H) 03/03/2018 0215   LDLCALC 122 (H) 10/13/2017 0835   LDLDIRECT 158.0 09/29/2016 1008      Wt Readings from Last 3 Encounters:  03/25/18 241 lb (109.3 kg)  03/07/18 238  lb 5.1 oz (108.1 kg)  01/18/18 245 lb (111.1 kg)      Other studies Reviewed RHC/LHC 03/05/3018    2nd Mrg lesion is 65% stenosed.    Ischemic cardiomyopathy, with acute on chronic systolic heart failure.  Angiographic estimation of EF is 10-20%.  Severely elevated LVEDP and moderately elevated pulmonary capillary wedge pressure.  Severe occlusive native vessel disease with occlusion of the proximal to mid LAD, total occlusion of the proximal to mid circumflex, and total occlusion of the mid RCA.  Bypass graft occlusive disease with total occlusion of the SVG to RCA and SVG to diagonal.  Patent LIMA to the LAD with severe diffuse disease in the LAD distal to the graft insertion site.  Patent free radial to the obtuse marginal with 60-70% distal anastomosis stenosis.  RECOMMENDATIONS:   Aggressive guideline directed therapy for systolic left ventricular dysfunction.  Advanced heart failure therapies should be considered.  Interventional/revascularization options are nil.   Echocardiogram 03/03/2018 Left ventricle: The cavity size was severely dilated. Wall   thickness was increased in a pattern of mild LVH. Systolic   function was severely reduced. The estimated ejection fraction   was in the range of 20% to 25%. Diffuse hypokinesis. Doppler   parameters are consistent with restrictive physiology, indicative   of decreased left ventricular diastolic compliance and/or   increased left atrial pressure. Doppler parameters are consistent   with high ventricular filling pressure. - Mitral valve: There was mild regurgitation. - Left  atrium: The atrium was mildly dilated. - Right ventricle: The cavity size was mildly dilated. Systolic   function was mildly reduced. - Right atrium: The atrium was mildly dilated.  Impressions: - Severe global reduction in LV systolic function; restrictive   filling; mild LVH; severe LVE; mild MR; mild biatrial   enlargement; mild RVE with mildly reduced RV function  ASSESSMENT AND PLAN:  1.  Severe systolic dysfunction: Most recent echocardiogram revealed an EF between 10% and 20%.  The patient heart rate is not optimal for reduced EF.  He remains on carvedilol 3.125 mg twice daily.  I will started on digoxin 0.125 mg daily to get heart rate down to below 70.  A follow-up BMET is going to be ordered.  He will continue on carvedilol, furosemide, and Entresto.  Blood pressure is low normal.  I will not adjust any of these medications at this time although Entresto can be titrated up if blood pressure is stable.  I am referring him to Advanced Heart Failure clinic to be evaluated more closely and for further recommendations.  He has had a right and left heart cath as above.Continue to wear Life Vest. Repeat echo in 2 months.   2. CAD: Cardiac catheterization 03/15/2018 as above: The patient had severe disease in LAD, with patent bypass grafts, severe occlusion of native vessel disease with occlusion of the proximal to mid LAD, CTO of the proximal to mid circumflex, and CTO of the mid RCA.  Continue secondary prevention with pravastatin, low-cholesterol diet, Zetia, blood pressure control and beta-blocker therapy.  3.  GERD: Continue PPI.   4.  Hypertension: Much better controlled on current medication regimen.  We will not titrate Entresto further until seen by CHF specialist.  He is currently asymptomatic with hypotension at this time.  May be able to increase dose   Current medicines are reviewed at length with the patient today.    Labs/ tests ordered today include:BMET Calvin Francis.  West Pugh, ANP, AACC  03/25/2018 3:09 PM    [facility] [address] [place], [place] [address] Phone: [phone]; Fax: [phone]

## 2018-03-25 ENCOUNTER — Encounter: Payer: Self-pay | Admitting: Adult Health

## 2018-03-25 ENCOUNTER — Ambulatory Visit: Payer: Medicare HMO | Admitting: Adult Health

## 2018-03-25 VITALS — BP 104/58 | HR 84 | Ht 74.0 in | Wt 241.0 lb

## 2018-03-25 DIAGNOSIS — E7800 Pure hypercholesterolemia, unspecified: Secondary | ICD-10-CM

## 2018-03-25 DIAGNOSIS — I10 Essential (primary) hypertension: Secondary | ICD-10-CM

## 2018-03-25 DIAGNOSIS — Z79899 Other long term (current) drug therapy: Secondary | ICD-10-CM | POA: Diagnosis not present

## 2018-03-25 DIAGNOSIS — I2510 Atherosclerotic heart disease of native coronary artery without angina pectoris: Secondary | ICD-10-CM | POA: Diagnosis not present

## 2018-03-25 DIAGNOSIS — I5022 Chronic systolic (congestive) heart failure: Secondary | ICD-10-CM

## 2018-03-25 DIAGNOSIS — I43 Cardiomyopathy in diseases classified elsewhere: Secondary | ICD-10-CM | POA: Diagnosis not present

## 2018-03-25 MED ORDER — DIGOXIN 125 MCG PO TABS: 0.1250 mg | ORAL_TABLET | Freq: Every day | ORAL | 3 refills | Status: DC

## 2018-03-25 MED ORDER — CARVEDILOL 3.125 MG PO TABS: 3.1250 mg | ORAL_TABLET | Freq: Two times a day (BID) | ORAL | 11 refills | Status: DC

## 2018-03-25 MED ORDER — PRASUGREL HCL 10 MG PO TABS: 10.0000 mg | ORAL_TABLET | Freq: Every day | ORAL | 3 refills | Status: DC

## 2018-03-25 MED ORDER — FUROSEMIDE 20 MG PO TABS: 20.0000 mg | ORAL_TABLET | Freq: Every day | ORAL | 11 refills | Status: DC

## 2018-03-25 NOTE — Patient Instructions (Addendum)
Medication Instructions:  START DIGOXIN 0.125MG  DAILY  If you need a refill on your cardiac medications before your next appointment, please call your pharmacy.  Labwork: BMET IN 1 WEEK HERE IN OUR OFFICE AT [facility]  Special Instructions: URGENT REFERRAL TO ADVANCED HEART FAILURE CLINIC[facility]  Follow-Up: Your physician wants you to follow-up in: 3 MONTHS WITH DR Tresa EndoKELLY   Thank you for choosing [facility]!!

## 2018-03-29 ENCOUNTER — Ambulatory Visit (INDEPENDENT_AMBULATORY_CARE_PROVIDER_SITE_OTHER): Payer: Medicare HMO | Admitting: Family Medicine

## 2018-03-29 ENCOUNTER — Encounter: Payer: Self-pay | Admitting: Family Medicine

## 2018-03-29 VITALS — BP 112/74 | HR 77 | Temp 97.8°F | Wt 240.0 lb

## 2018-03-29 DIAGNOSIS — I255 Ischemic cardiomyopathy: Secondary | ICD-10-CM

## 2018-03-29 DIAGNOSIS — M25512 Pain in left shoulder: Secondary | ICD-10-CM

## 2018-03-29 MED ORDER — TRAMADOL HCL 50 MG PO TABS: 50.0000 mg | ORAL_TABLET | Freq: Two times a day (BID) | ORAL | 0 refills | Status: DC | PRN

## 2018-03-29 NOTE — Assessment & Plan Note (Signed)
Anticipate acute RTC tendonitis vs less likely pec strain, r/o RTC tear, after operating hard to move steering wheel. Likely now developing adhesive capsulitis. Discussed with patient. NSAIDs likely contraindicated given extensive cardiac history. Will Rx tramadol, continue ice, rec avoid sling for now. Will refer to sports med for further eval - he has [facility]. Advised to call first thing tomorrow morning to schedule appt. Pt agrees with plan.

## 2018-03-29 NOTE — Assessment & Plan Note (Signed)
Markedly reduced EF, now on new medications for medical management, with no further operative options at this time. Planned to establish with CHF clinic tomorrow - appreciate their care for this very pleasant patient.

## 2018-03-29 NOTE — Patient Instructions (Addendum)
I think you strained your shoulder, may be developing frozen shoulder. Avoid shoulder sling for now.  Keep ice to shoulder 15-20 min at a time (covered in towel).  Treat pain with tramadol - sent to pharmacy.  Return to see sports medicine this week - Dr Katrinka BlazingSmith if available.

## 2018-03-29 NOTE — Progress Notes (Signed)
BP 112/74 (BP Location: Right Arm, Patient Position: Sitting, Cuff Size: Normal)   Pulse 77   Temp 97.8 F (36.6 C) (Oral)   Wt 240 lb (108.9 kg)   SpO2 98%   BMI 30.81 kg/m    CC: shoulder pain Subjective:    Patient ID: Calvin Francis, male    DOB: January 29, 1961, 57 y.o.   MRN: [id number]  HPI: Calvin Francis is a 57 y.o. male presenting on 03/29/2018 for Shoulder Pain (Left shoulder pain. Painful to touch and with most movements. Started 02/25/18. Tried heat/ice therapy and topical pain cream. )   4d h/o L anterior shoulder pain, tender to the touch, treating with deep blue cream and ice with improvement. Heat didn't help. No new rashes.  He may have irritated shoulder while steering old truck without power steering.  He is left handed - affecting day to day abilities.  Complicated recent cardiac history followed closely by cardiology - including recent hospitalization for chest pain/dyspnea in known severe CAD with new severe systolic dysfunction (EF 10-20%). meds changed around including new addition of Zetia, Coreg, digoxin, and Entresto. Has appt tomorrow with CHF clinic. He did have R&L heart catheterization through L radial artery recently.   Relevant past medical, surgical, family and social history reviewed and updated as indicated. Interim medical history since our last visit reviewed. Allergies and medications reviewed and updated. Outpatient Medications Prior to Visit  Medication Sig Dispense Refill  . albuterol (PROVENTIL HFA;VENTOLIN HFA) 108 (90 BASE) MCG/ACT inhaler Inhale 2 puffs into the lungs every 4 (four) hours as needed for wheezing or shortness of breath. 1 Inhaler 0  . ALPRAZolam (XANAX) 0.25 MG tablet Take 1 tablet (0.25 mg total) by mouth 2 (two) times daily as needed for anxiety. 60 tablet 3  . aspirin 81 MG chewable tablet Chew 1 tablet (81 mg total) by mouth daily. 30 tablet 11  . carvedilol (COREG) 3.125 MG tablet Take 1 tablet (3.125 mg total) by mouth 2  (two) times daily with a meal. 60 tablet 11  . co-enzyme Q-10 30 MG capsule Take 1 capsule (30 mg total) by mouth daily. (Patient taking differently: Take 30 mg by mouth 2 (two) times daily. )    . COLCRYS 0.6 MG tablet TAKE AS DIRECTED 2 TABLETS BY MOUTH ON FIRST DAY THEN ONE DAILY AS NEEDED 30 tablet 3  . ezetimibe (ZETIA) 10 MG tablet Take 1 tablet (10 mg total) by mouth daily. 30 tablet 0  . fluorouracil (EFUDEX) 5 % cream Apply topically 2 (two) times daily. 40 g 1  . fluticasone (FLONASE) 50 MCG/ACT nasal spray Place 2 sprays into both nostrils daily. (Patient taking differently: Place 2 sprays into both nostrils as needed for allergies. ) 16 g 1  . furosemide (LASIX) 20 MG tablet Take 1 tablet (20 mg total) by mouth daily. 30 tablet 11  . nitroGLYCERIN (NITROSTAT) 0.4 MG SL tablet Place 1 tablet (0.4 mg total) under the tongue every 5 (five) minutes as needed for chest pain. 25 tablet 0  . omeprazole (PRILOSEC OTC) 20 MG tablet Take 20 mg by mouth daily.    . prasugrel (EFFIENT) 10 MG TABS tablet Take 1 tablet (10 mg total) by mouth daily. 90 tablet 3  . pravastatin (PRAVACHOL) 40 MG tablet Take 1 tablet (40 mg total) by mouth daily. (Patient taking differently: Take 40 mg by mouth every other day. Pt states he doesn't take every day) 90 tablet 3  . ranolazine (RANEXA)  1000 MG SR tablet Take 1 tablet (1,000 mg total) by mouth twice daily, as directed. 180 tablet 3  . sacubitril-valsartan (ENTRESTO) 24-26 MG Take 1 tablet by mouth 2 (two) times daily. 60 tablet 2  . vitamin B-12 (CYANOCOBALAMIN) 500 MCG tablet Take 500 mcg by mouth daily.    . digoxin (LANOXIN) 0.125 MG tablet Take 1 tablet (0.125 mg total) by mouth daily. 30 tablet 3   No facility-administered medications prior to visit.      Per HPI unless specifically indicated in ROS section below Review of Systems     Objective:    BP 112/74 (BP Location: Right Arm, Patient Position: Sitting, Cuff Size: Normal)   Pulse 77   Temp  97.8 F (36.6 C) (Oral)   Wt 240 lb (108.9 kg)   SpO2 98%   BMI 30.81 kg/m   Wt Readings from Last 3 Encounters:  03/29/18 240 lb (108.9 kg)  03/25/18 241 lb (109.3 kg)  03/07/18 238 lb 5.1 oz (108.1 kg)    Physical Exam  Constitutional: He appears well-developed and well-nourished. No distress.  Musculoskeletal: He exhibits no edema.  R shoulder WNL Marked limited ROM with forward flexion and abduction at L shoulder both actively and passively Point tender to palpation upper chest and anterior shoulder just inferior to L AC joint + impingement Limited exam due to pain  Skin: Skin is warm and dry. No rash noted. No erythema.  Psychiatric: He has a normal mood and affect.  Nursing note and vitals reviewed.      Assessment & Plan:   Problem List Items Addressed This Visit    Acute pain of left shoulder - Primary    Anticipate acute RTC tendonitis vs less likely pec strain, r/o RTC tear, after operating hard to move steering wheel. Likely now developing adhesive capsulitis. Discussed with patient. NSAIDs likely contraindicated given extensive cardiac history. Will Rx tramadol, continue ice, rec avoid sling for now. Will refer to sports med for further eval - he has previously seen Dr Kat[facility]inka Blazing at [facility]. Advised to call first thing tomorrow morning to schedule appt. Pt agrees with plan.       Ischemic cardiomyopathy (Chronic)    Markedly reduced EF, now on new medications for medical management, with no further operative options at this time. Planned to establish with CHF clinic tomorrow - appreciate their care for this very pleasant patient.           Meds ordered this encounter  Medications  . traMADol (ULTRAM) 50 MG tablet    Sig: Take 1 tablet (50 mg total) by mouth 2 (two) times daily as needed for moderate pain (sedation precautions).    Dispense:  30 tablet    Refill:  0   No orders of the defined types were placed in this encounter.   Follow up plan: No follow-ups on  file.  Eustaquio Boyden, MD

## 2018-03-30 ENCOUNTER — Ambulatory Visit (HOSPITAL_COMMUNITY)
Admission: RE | Admit: 2018-03-30 | Discharge: 2018-03-30 | Disposition: A | Discharge status: Home / Self Care | Payer: Medicare HMO | Source: Ambulatory Visit | Attending: Internal Medicine | Admitting: Internal Medicine

## 2018-03-30 VITALS — BP 116/78 | HR 82 | Wt 241.4 lb

## 2018-03-30 DIAGNOSIS — I252 Old myocardial infarction: Secondary | ICD-10-CM | POA: Diagnosis not present

## 2018-03-30 DIAGNOSIS — I2510 Atherosclerotic heart disease of native coronary artery without angina pectoris: Secondary | ICD-10-CM | POA: Insufficient documentation

## 2018-03-30 DIAGNOSIS — N179 Acute kidney failure, unspecified: Secondary | ICD-10-CM | POA: Diagnosis not present

## 2018-03-30 DIAGNOSIS — Z79899 Other long term (current) drug therapy: Secondary | ICD-10-CM | POA: Insufficient documentation

## 2018-03-30 DIAGNOSIS — Z888 Allergy status to other drugs, medicaments and biological substances status: Secondary | ICD-10-CM | POA: Diagnosis not present

## 2018-03-30 DIAGNOSIS — I110 Hypertensive heart disease with heart failure: Secondary | ICD-10-CM | POA: Insufficient documentation

## 2018-03-30 DIAGNOSIS — Z7982 Long term (current) use of aspirin: Secondary | ICD-10-CM | POA: Insufficient documentation

## 2018-03-30 DIAGNOSIS — R0683 Snoring: Secondary | ICD-10-CM | POA: Insufficient documentation

## 2018-03-30 DIAGNOSIS — Z87891 Personal history of nicotine dependence: Secondary | ICD-10-CM | POA: Insufficient documentation

## 2018-03-30 DIAGNOSIS — I5022 Chronic systolic (congestive) heart failure: Secondary | ICD-10-CM | POA: Diagnosis not present

## 2018-03-30 DIAGNOSIS — I5020 Unspecified systolic (congestive) heart failure: Secondary | ICD-10-CM | POA: Diagnosis not present

## 2018-03-30 DIAGNOSIS — K219 Gastro-esophageal reflux disease without esophagitis: Secondary | ICD-10-CM | POA: Diagnosis not present

## 2018-03-30 DIAGNOSIS — I2582 Chronic total occlusion of coronary artery: Secondary | ICD-10-CM | POA: Diagnosis not present

## 2018-03-30 LAB — BASIC METABOLIC PANEL
ANION GAP: 11 (ref 5–15)
BUN: 8 mg/dL (ref 6–20)
CALCIUM: 9.2 mg/dL (ref 8.9–10.3)
CO2: 21 mmol/L — ABNORMAL LOW (ref 22–32)
Chloride: 103 mmol/L (ref 101–111)
Creatinine, Ser: 0.93 mg/dL (ref 0.61–1.24)
GFR calc Af Amer: >60 mL/min (ref >60)
GLUCOSE: 91 mg/dL (ref 65–99)
Potassium: 4 mmol/L (ref 3.5–5.1)
Sodium: 135 mmol/L (ref 135–145)

## 2018-03-30 MED ORDER — FUROSEMIDE 20 MG PO TABS: 20.0000 mg | ORAL_TABLET | Freq: Every day | ORAL | 6 refills | Status: DC

## 2018-03-30 MED ORDER — SACUBITRIL-VALSARTAN 24-26 MG PO TABS
1.0000 | ORAL_TABLET | Freq: Two times a day (BID) | ORAL | 6 refills | Status: DC
Start: 2018-03-30 — End: 2018-07-26

## 2018-03-30 MED ORDER — SPIRONOLACTONE 25 MG PO TABS: 12.5000 mg | ORAL_TABLET | Freq: Every day | ORAL | 3 refills | Status: DC

## 2018-03-30 MED ORDER — EZETIMIBE 10 MG PO TABS: 10.0000 mg | ORAL_TABLET | Freq: Every day | ORAL | 11 refills | Status: DC

## 2018-03-30 MED ORDER — CARVEDILOL 3.125 MG PO TABS: 3.1250 mg | ORAL_TABLET | Freq: Two times a day (BID) | ORAL | 6 refills | Status: DC

## 2018-03-30 NOTE — Progress Notes (Addendum)
[facility] Consult  Note   Referring Physician: Joni Reining, NP PCP: Eustaquio Boyden, MD PCP-Cardiologist: Bryan Lemma, MD   HPI: Calvin Francis is a 57 y.o. male with a history of CAD s/p 4v CABG in 2005, ICM, HTN, obesity, Barrett's esophagus, and hyperlipidemia.   Referred by Bailey Mech NP for further evaluation of HF and evaluation for possible advanced therapies.  Has h/o CAD with CABG in 2005. Has done well since that time until recently   Admitted 3/5-3/10/19 with CP. LHC as below. 2/4 CABG grafts occluded. No good revascularization options, so medical therapy was advised. His EF on cath was 10-20% with CO 4.8, CI 2.06. Echo 3/6 showed EF 20-25% with diffuse HK, mild RV dysfunction, mild MR, mild biatrial enlargement. He diuresed 2 lbs with IV lasix. HF meds were optimized, but limited with orthostatic hypotension and AKI (creatinine peak 1.3). He was discharged with a lifevest. DC weight 238 lbs.   He was seen at [facility] for hospital follow up on 3/28. Had NYHA III symptoms. He was started on digoxin 0.125 mg daily and was referred to [facility].  He is here with his wife. Overall, he is doing well. Can do most things he wants to do. Works in his garage a lot fixing cars. His energy level is good. He was having some dizziness while inpatient that is much improved, but he stands up slowly. He is not doing cardiac rehab, but reports staying very active. He denies CP and palpitations. Denies orthopnea (sleeps on 2 pillows for Barrett's esophagus), PND, and edema. No cough, fever, or chills. He does not get SOB, even with stairs. Weights at home around 236 lbs. Compliant with medications. He drinks about 2L/day and does not add salt to foods. He did not start digoxin from [facility] clinic. He snores at night and has never had a sleep study, but does not think he would tolerate CPAP. He has a lot of anxiety.   He lives in  [place] with his wife. He is not working. He quit smoking in January. He wants to know if he can go on a cruise with his family mid-May.   R/LHC 03/05/18:  2nd Mrg lesion is 65% stenosed.  Ischemic cardiomyopathy, with acute on chronic systolic heart failure.  Angiographic estimation of EF is 10-20%.  Severely elevated LVEDP and moderately elevated pulmonary capillary wedge pressure.  Severe occlusive native vessel disease with occlusion of the proximal to mid LAD, total occlusion of the proximal to mid circumflex, and total occlusion of the mid RCA.  Bypass graft occlusive disease with total occlusion of the SVG to RCA and SVG to diagonal.  Patent LIMA to the LAD with severe diffuse disease in the LAD distal to the graft insertion site.  Patent free radial to the obtuse marginal with 60-70% distal anastomosis stenosis. Fick Cardiac Output 4.81 L/min  Fick Cardiac Output Index 2.06 (L/min)/BSA  RA A Wave 13 mmHg  RA V Wave 14 mmHg  RA Mean 10 mmHg  RV Systolic Pressure 43 mmHg  RV Diastolic Pressure 4 mmHg  RV EDP 12 mmHg  PA Systolic Pressure 45 mmHg  PA Diastolic Pressure 21 mmHg  PA Mean 31 mmHg  PW A Wave 22 mmHg  PW V Wave 34 mmHg  PW Mean 22 mmHg  AO Systolic Pressure 100 mmHg  AO Diastolic Pressure 67 mmHg  AO Mean 81 mmHg  LV Systolic Pressure 96 mmHg  LV Diastolic Pressure  13 mmHg  LV EDP 26 mmHg    Echo 03/03/18: Left ventricle: The cavity size was severely dilated. Wall   thickness was increased in a pattern of mild LVH. Systolic   function was severely reduced. The estimated ejection fraction   was in the range of 20% to 25%. Diffuse hypokinesis. Doppler   parameters are consistent with restrictive physiology, indicative   of decreased left ventricular diastolic compliance and/or   increased left atrial pressure. Doppler parameters are consistent   with high ventricular filling pressure. - Mitral valve: There was mild regurgitation. - Left atrium: The  atrium was mildly dilated. - Right ventricle: The cavity size was mildly dilated. Systolic   function was mildly reduced. - Right atrium: The atrium was mildly dilated.  Echo 2013: EF 40%   Review of Systems: [y] = yes, [ ]  = no   General: Weight gain [ ] ; Weight loss [ ] ; Anorexia Cove.Etienne[y ]; Fatigue [ ] ; Fever [ ] ; Chills [ ] ; Weakness [ ]   Cardiac: Chest pain/pressure [ ] ; Resting SOB [ ] ; Exertional SOB [ ] ; Orthopnea [ ] ; Pedal Edema [ ] ; Palpitations [ ] ; Syncope [ ] ; Presyncope [ ] ; Paroxysmal nocturnal dyspnea[ ]   Pulmonary: Cough [ ] ; Wheezing[ ] ; Hemoptysis[ ] ; Sputum [ ] ; Snoring [ ]   GI: Vomiting[ ] ; Dysphagia[ ] ; Melena[ ] ; Hematochezia [ ] ; Heartburn[ ] ; Abdominal pain [ ] ; Constipation [ ] ; Diarrhea [ ] ; BRBPR [ ]   GU: Hematuria[ ] ; Dysuria [ ] ; Nocturia[ ]   Vascular: Pain in legs with walking [ ] ; Pain in feet with lying flat [ ] ; Non-healing sores [ ] ; Stroke [ ] ; TIA [ ] ; Slurred speech [ ] ;  Neuro: Headaches[ ] ; Vertigo[ ] ; Seizures[ ] ; Paresthesias[ ] ;Blurred vision [ ] ; Diplopia [ ] ; Vision changes [ ]   Ortho/Skin: Arthritis Cove.Etienne[y ]; Joint pain [ y]; Muscle pain [ ] ; Joint swelling [ ] ; Back Pain [ ] ; Rash [ ]   Psych: Depression[ ] ; Anxiety[y ]  Heme: Bleeding problems [ ] ; Clotting disorders [ ] ; Anemia [ ]   Endocrine: Diabetes [ ] ; Thyroid dysfunction[ ]    Past Medical History:  Diagnosis Date  . 3-vessel CAD 02/22/2004   mid RCA 100% occluded, L-R collaterals; LAD 60-70% bifurcation lesion; Cx proximal 70% and mid 80% after OM1, OM1 100% occluded. --> CABG x 4  . Barrett's esophagus    s/p EGD several years ago with [place]  . Bronchitis, mucopurulent recurrent (HCC)   . CAD (coronary artery disease) of bypass graft 5/'12; 12/'13   Cath for angina and Abn Myoview ST: SVG-OM1 now occluded, attempt at PCI to the native circumflex OM1 unsuccessful; distal LAD beyond patent LIMA 70-80% (not PCI amenable); free radial-OM 2 patent; EF 45-50%  . Erectile dysfunction   .  Former heavy cigarette smoker (20-39 per day) May 2013   . GERD (gastroesophageal reflux disease)    ?barrett's, with esophageal dysmotility, on omeprazole  . Glucose intolerance (impaired glucose tolerance)  2009   Prediabetes  . Gout   . History of Non-STEMI (non-ST elevated myocardial infarction) 02/22/2004   Three-vessel disease --> referred for CABG  . History of Non-STEMI (non-ST elevated myocardial infarction) December 2011   Hazy lesion in SVG-OM1 --> staged PCI with 4.0 mm x 20 mm vision BMS (4.5 mm)  . HLD (hyperlipidemia)    Statin intolerant  . Hypertension, benign   . Ischemic cardiomyopathy Echo 10/2012   EF ~40%; moderate Posterior HypoKinesis, minld-moderate inferior Hypokinesis; mild RV dilation, mild concentric  LVH  . 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 Stable angina (HCC) 11/2012   Chronic,some what stable but still present; cardiac cath results above, no significant change from 2012  . Testosterone deficiency    On replacement; goal is low normal.  . Varicose veins December 2013   with venous reflux status VNUS ablation of left greater saphenous wein    Current Outpatient Medications  Medication Sig Dispense Refill  . albuterol (PROVENTIL HFA;VENTOLIN HFA) 108 (90 BASE) MCG/ACT inhaler Inhale 2 puffs into the lungs every 4 (four) hours as needed for wheezing or shortness of breath. 1 Inhaler 0  . ALPRAZolam (XANAX) 0.25 MG tablet Take 1 tablet (0.25 mg total) by mouth 2 (two) times daily as needed for anxiety. 60 tablet 3  . aspirin 81 MG chewable tablet Chew 1 tablet (81 mg total) by mouth daily. 30 tablet 11  . carvedilol (COREG) 3.125 MG tablet Take 1 tablet (3.125 mg total) by mouth 2 (two) times daily with a meal. 60 tablet 11  . co-enzyme Q-10 30 MG capsule Take 1 capsule (30 mg total) by mouth daily. (Patient taking differently: Take 30 mg by mouth 2 (two) times daily. )    . COLCRYS 0.6 MG tablet TAKE AS DIRECTED 2  TABLETS BY MOUTH ON FIRST DAY THEN ONE DAILY AS NEEDED 30 tablet 3  . ezetimibe (ZETIA) 10 MG tablet Take 1 tablet (10 mg total) by mouth daily. 30 tablet 0  . fluorouracil (EFUDEX) 5 % cream Apply topically 2 (two) times daily. 40 g 1  . fluticasone (FLONASE) 50 MCG/ACT nasal spray Place 2 sprays into both nostrils daily. (Patient taking differently: Place 2 sprays into both nostrils as needed for allergies. ) 16 g 1  . furosemide (LASIX) 20 MG tablet Take 1 tablet (20 mg total) by mouth daily. 30 tablet 11  . nitroGLYCERIN (NITROSTAT) 0.4 MG SL tablet Place 1 tablet (0.4 mg total) under the tongue every 5 (five) minutes as needed for chest pain. 25 tablet 0  . omeprazole (PRILOSEC OTC) 20 MG tablet Take 20 mg by mouth daily.    . prasugrel (EFFIENT) 10 MG TABS tablet Take 1 tablet (10 mg total) by mouth daily. 90 tablet 3  . pravastatin (PRAVACHOL) 40 MG tablet Take 1 tablet (40 mg total) by mouth daily. (Patient taking differently: Take 40 mg by mouth every other day. Pt states he doesn't take every day) 90 tablet 3  . ranolazine (RANEXA) 1000 MG SR tablet Take 1 tablet (1,000 mg total) by mouth twice daily, as directed. 180 tablet 3  . sacubitril-valsartan (ENTRESTO) 24-26 MG Take 1 tablet by mouth 2 (two) times daily. 60 tablet 2  . traMADol (ULTRAM) 50 MG tablet Take 1 tablet (50 mg total) by mouth 2 (two) times daily as needed for moderate pain (sedation precautions). 30 tablet 0  . vitamin B-12 (CYANOCOBALAMIN) 500 MCG tablet Take 500 mcg by mouth daily.     No current facility-administered medications for this encounter.     Allergies  Allergen Reactions  . Statins   . Zetia [Ezetimibe]   . Clopidogrel Other (See Comments)    Disoriented   . Codeine Rash      Social History   Socioeconomic History  . Marital status: Married    Spouse name: Not on file  . Number of children: Not on file  . Years of education: Not on file  .  Highest education level: Not on file  Occupational  History  . Not on file  Social Needs  . Financial resource strain: Not on file  . Food insecurity:    Worry: Not on file    Inability: Not on file  . Transportation needs:    Medical: Not on file    Non-medical: Not on file  Tobacco Use  . Smoking status: Former Smoker    Packs/day: 0.50    Years: 20.00    Pack years: 10.00    Types: Cigarettes    Last attempt to quit: 04/28/2012    Years since quitting: 5.9  . Smokeless tobacco: Never Used  . Tobacco comment: long time smoker, smokes occasional -anxiety  Substance and Sexual Activity  . Alcohol use: Yes    Alcohol/week: 0.0 oz    Comment: occasionally  . Drug use: No  . Sexual activity: Yes  Lifestyle  . Physical activity:    Days per week: Not on file    Minutes per session: Not on file  . Stress: Not on file  Relationships  . Social connections:    Talks on phone: Not on file    Gets together: Not on file    Attends religious service: Not on file    Active member of club or organization: Not on file    Attends meetings of clubs or organizations: Not on file    Relationship status: Not on file  . Intimate partner violence:    Fear of current or ex partner: Not on file    Emotionally abused: Not on file    Physically abused: Not on file    Forced sexual activity: Not on file  Other Topics Concern  . Not on file  Social History Narrative   Caffeine: 1 cup coffee in am   Lives with wife, 2 dogs   Married, 2 Grown children, 3 grandchildren   Occupation: disability from cardiac status for last year, prior worked for [employer]   Activity: walks driveway, limited by chest pain/SOB    Diet: good water, fruits/vegetables daily      Family History  Problem Relation Age of Onset  . Diabetes Paternal Aunt   . Coronary artery disease Paternal Uncle   . Stroke Maternal Uncle   . Cancer Neg Hx     Vitals:   03/30/18 1028  BP: 116/78  Pulse: 82  SpO2: 98%  Weight: 241 lb 6 oz (109.5 kg)   Filed Weights    03/30/18 1028  Weight: 241 lb 6 oz (109.5 kg)    PHYSICAL EXAM: General:  Well appearing. No respiratory difficulty. Lifevest on HEENT: normal anicteric Neck: supple. JVP 7-8 Carotids 2+ bilat; no bruits. No lymphadenopathy or thyromegaly appreciated. Cor: PMI nondisplaced. Regular rate & rhythm. No rubs, gallops or murmurs. No s3 Lungs: clear no wheeze Abdomen: soft, nontender, nondistended. No hepatosplenomegaly. No bruits or masses. Good bowel sounds. Extremities: no cyanosis, clubbing, rash, trace edema. warm Neuro: alert & oriented x 3, cranial nerves grossly intact. moves all 4 extremities w/o difficulty. Affect pleasant but anxious.   ASSESSMENT & PLAN:  1. Systolic HF due to ICM. Echo 2013: EF 40%, Echo 02/2018: EF 20-25% with diffuse HK, mild RV dysfunction, mild MR, mild biatrial enlargement. EF on cath 10-15% - NYHA class II-III - Volume status mildly elevated with ReDS vest 38% - Continue lasix 20 mg daily - Continue coreg 3.125 mg BID - Continue entresto 24/26 mg BID - Start spiro 12.5 mg  daily. Creatinine 1.1, K 4.2 on 3/10. BMET today  - Continue Lifevest - Discussed daily weights, limiting fluid intake, and limiting salt intake.  - CPX - Refer to cardiac rehab  2. CAD s/p 4v CABG 2005, LHC 02/2018: 2nd Mrg lesion is 65% stenosed, Severe occlusive native vessel disease with occlusion of the proximal to mid LAD, total occlusion of the proximal to mid circumflex, and total occlusion of the mid RCA. Bypass graft occlusive disease with total occlusion of the SVG to RCA and SVG to diagonal. Patent LIMA to the LAD with severe diffuse disease in the LAD distal to the graft insertion site. Patent free radial to the obtuse marginal with 60-70% distal anastomosis stenosis. - No CP - Continue ASA, statin  3. HTN - Well controlled on HF medications  4. HL - Continue statin, zetia. Recent increase in pravastatin, zetia new from last month.  - LDL 111 on 03/03/18  5. Recent  AKI - Creatinine up to 1.3 while inpatient.  - Check BMET today.   6. Snoring - Has never had a sleep study, but thinks he wouldn't tolerate CPAP  7. Claudication - Normal ABIs 9/18  CPX Start Spiro 12.5 mg daily BMET today and with CPX F/u 4-6 weeks Refer to cardiac rehab  Alford Highland, NP 03/30/18   Patient seen and examined with the above-signed Advanced Practice Provider and/or Housestaff. I personally reviewed laboratory data, imaging studies and relevant notes. I independently examined the patient and formulated the important aspects of the plan. I have edited the note to reflect any of my changes or salient points. I have personally discussed the plan with the patient and/or family.  Echo and recent cath films reviewed personally. 57 y/o male with severe ischemic CM recently admitted with CP/CHF and found to have severe reduction in EF. Cath with severe native CAD and loss of 2 SVGs. No revasc options. On good HF meds but at low-doses due to low BP. From functional standpoint likely NYHA II-early III. Volume status mildly elevated on exam and by ReDS (38%).. No s3 or evidence of low output. Agree with adding spiro. Will get CPX to further risk stratify. Check labs in 1 week. Long talk about evaluation process for advanced therapies. He understandably has significant anxiety over his HF diagnosis and I think this is hindering him a bit.   Reports claudication symptoms in RLE but recent ABIs reviewed and were OK.   We will refer to cardiac rehab. Needs sleep study as well .  Arvilla Meres, MD  11:38 PM

## 2018-03-30 NOTE — Patient Instructions (Signed)
Labs today (will call for abnormal results, otherwise no news is good news)  START taking Spironolactone 12.5 mg (0.5 Tablet) once Daily  Cardiopulmonary Test has been ordered for you, please see attached instructions sheet.  You have been referred to cardiac rehab, they will contact you for initial appointment.  Labs in 2 weeks (bmet)  Follow up in 6 weeks with Dr. Gala RomneyBensimhon

## 2018-03-30 NOTE — Progress Notes (Signed)
ReDS Vest - 03/30/18 1200      ReDS Vest   MR   No    Estimated volume prior to reading  Med    Fitting Posture  Standing    Height Marker  Tall    Ruler Value  10    Center Strip  Shifted    ReDS Value  38

## 2018-03-31 ENCOUNTER — Telehealth (HOSPITAL_COMMUNITY): Payer: Self-pay

## 2018-03-31 NOTE — Telephone Encounter (Signed)
Patients insurance is active and benefits verified through [facility] - $10.00 co-pay, no deductible, out of pocket amount of $3,400/$1,180 has been met, no co-insurance, and no pre-authorization is required.  Patient will be contacted for scheduling upon review by the RN Navigator.

## 2018-04-01 ENCOUNTER — Telehealth (HOSPITAL_COMMUNITY): Payer: Self-pay | Admitting: Pharmacist

## 2018-04-01 NOTE — Addendum Note (Signed)
Encounter addended by: Dolores PattyBensimhon, Daniel R, MD on: 04/01/2018 11:41 PM  Actions taken: Sign clinical note

## 2018-04-01 NOTE — Telephone Encounter (Signed)
Mr. Wilson SingerHudson called stating that another MD had wanted him to start on digoxin but he never started it and he wanted to know if he should along with the spironolactone that Dr. Gala RomneyBensimhon prescribed on 4/2. Since he had not started digoxin prior to appointment with Dr. Gala RomneyBensimhon, I have advised him to hold off on the digoxin for now and start the spironolactone until his next visit with Dr. Gala RomneyBensimhon.   Tyler DeisErika K. Bonnye FavaNicolsen, PharmD, BCPS, CPP Clinical Pharmacist Phone: [phone] 04/01/2018 11:48 AM

## 2018-04-07 DIAGNOSIS — I420 Dilated cardiomyopathy: Secondary | ICD-10-CM | POA: Diagnosis not present

## 2018-04-07 DIAGNOSIS — I252 Old myocardial infarction: Secondary | ICD-10-CM | POA: Diagnosis not present

## 2018-04-09 ENCOUNTER — Telehealth (HOSPITAL_COMMUNITY): Payer: Self-pay

## 2018-04-09 NOTE — Telephone Encounter (Signed)
Called patient to see if he is interested in the Cardiac rehab program - patient stated he is not and if anything changes he will call. Closed referral.

## 2018-04-14 ENCOUNTER — Ambulatory Visit (HOSPITAL_COMMUNITY)
Admission: RE | Admit: 2018-04-14 | Discharge: 2018-04-14 | Disposition: A | Discharge status: Home / Self Care | Payer: Medicare HMO | Source: Ambulatory Visit | Attending: Internal Medicine | Admitting: Internal Medicine

## 2018-04-14 ENCOUNTER — Other Ambulatory Visit (HOSPITAL_COMMUNITY): Payer: Self-pay | Admitting: *Deleted

## 2018-04-14 ENCOUNTER — Ambulatory Visit (HOSPITAL_COMMUNITY): Payer: Medicare HMO

## 2018-04-14 DIAGNOSIS — I5022 Chronic systolic (congestive) heart failure: Secondary | ICD-10-CM | POA: Insufficient documentation

## 2018-04-14 LAB — BASIC METABOLIC PANEL
ANION GAP: 8 (ref 5–15)
BUN: 7 mg/dL (ref 6–20)
CALCIUM: 8.9 mg/dL (ref 8.9–10.3)
CO2: 24 mmol/L (ref 22–32)
Chloride: 106 mmol/L (ref 101–111)
Creatinine, Ser: 1.15 mg/dL (ref 0.61–1.24)
Glucose, Bld: 89 mg/dL (ref 65–99)
Potassium: 4 mmol/L (ref 3.5–5.1)
Sodium: 138 mmol/L (ref 135–145)

## 2018-04-19 ENCOUNTER — Telehealth (HOSPITAL_COMMUNITY): Payer: Self-pay | Admitting: *Deleted

## 2018-04-19 NOTE — Telephone Encounter (Signed)
Entresto 24-26mg  PA approved from 04/16/18 through 04/15/2020.

## 2018-04-20 IMAGING — CR DG CHEST 2V
3 series · 3 of 3 positions shown · non-contrast
Comparison: 12/27/2017

CLINICAL DATA: Shortness of breath with intermittent chest pain.

EXAM:
CHEST  2 VIEW

[chest lat (1 of 2)]
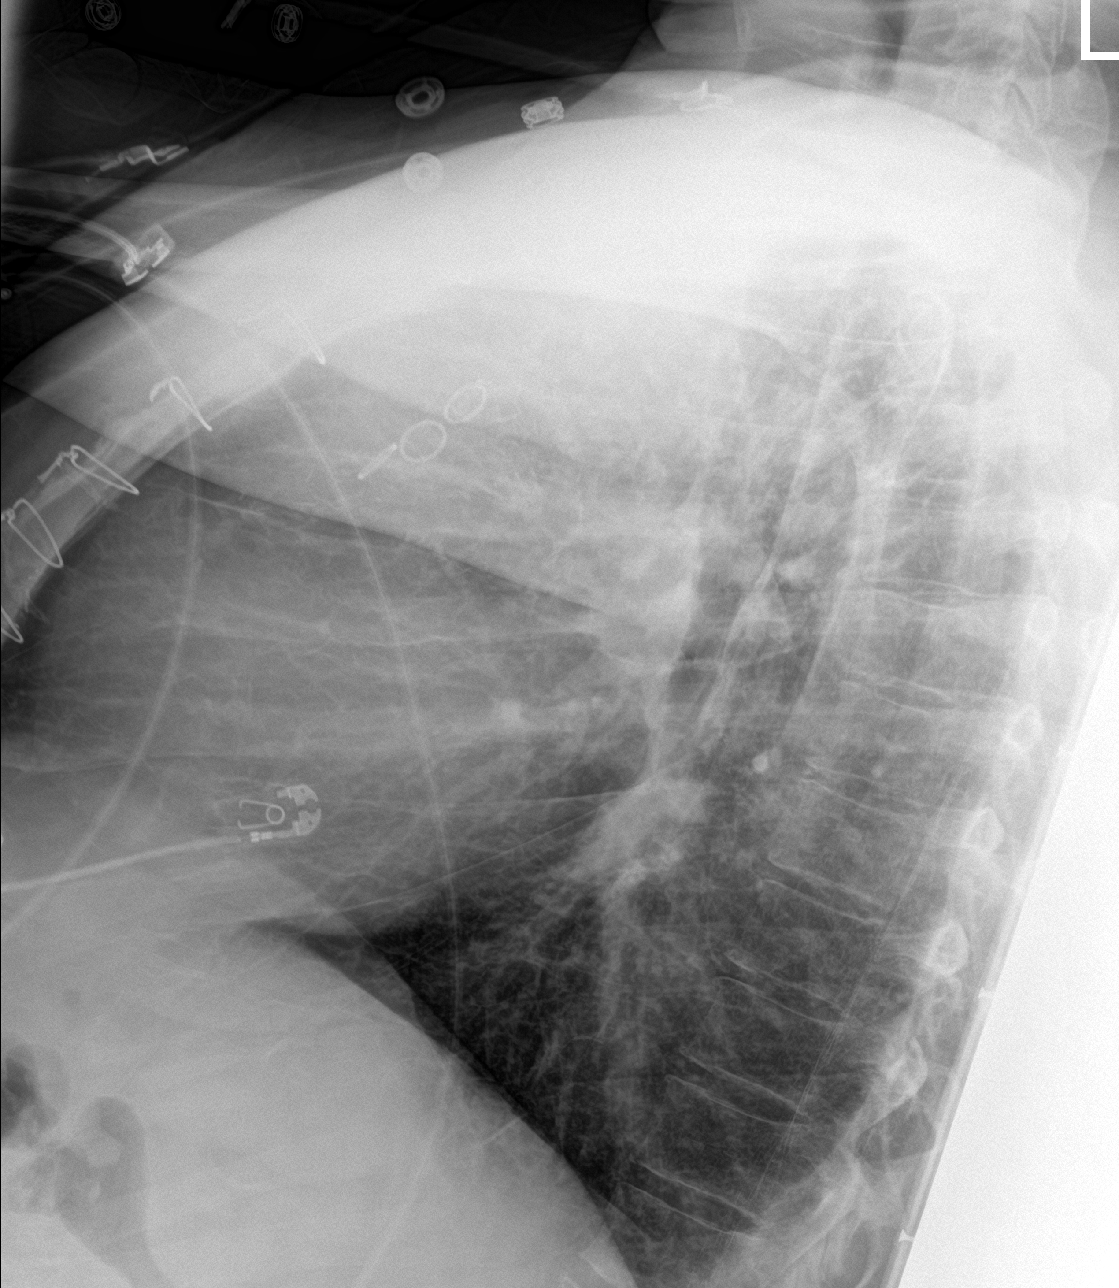

[chest ap]
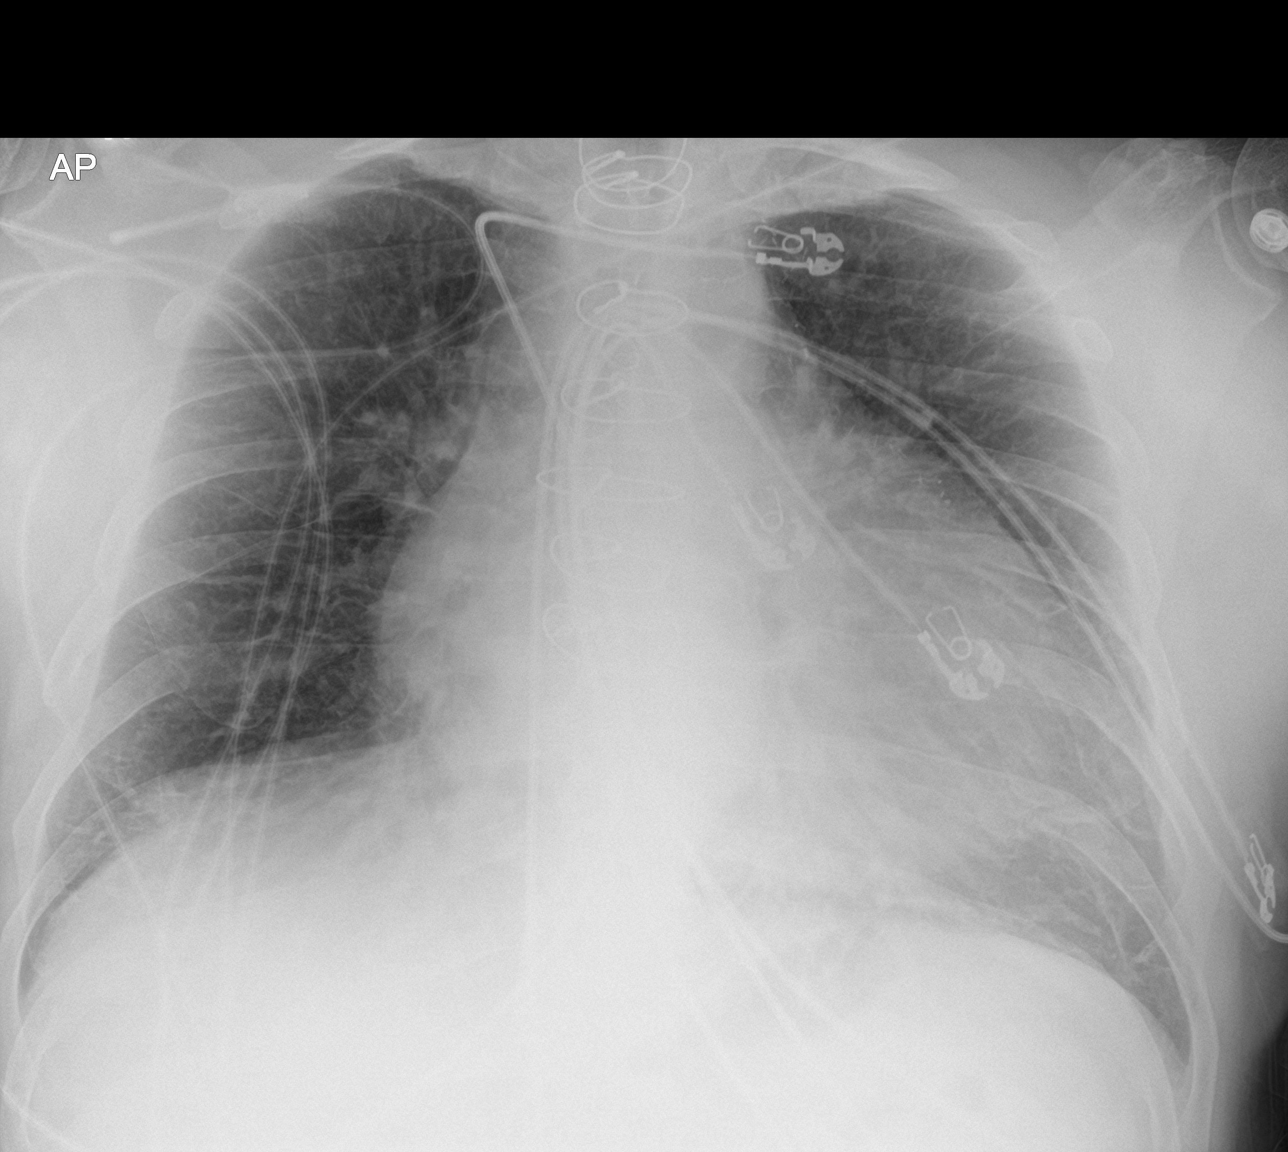

[chest lat (2 of 2)]
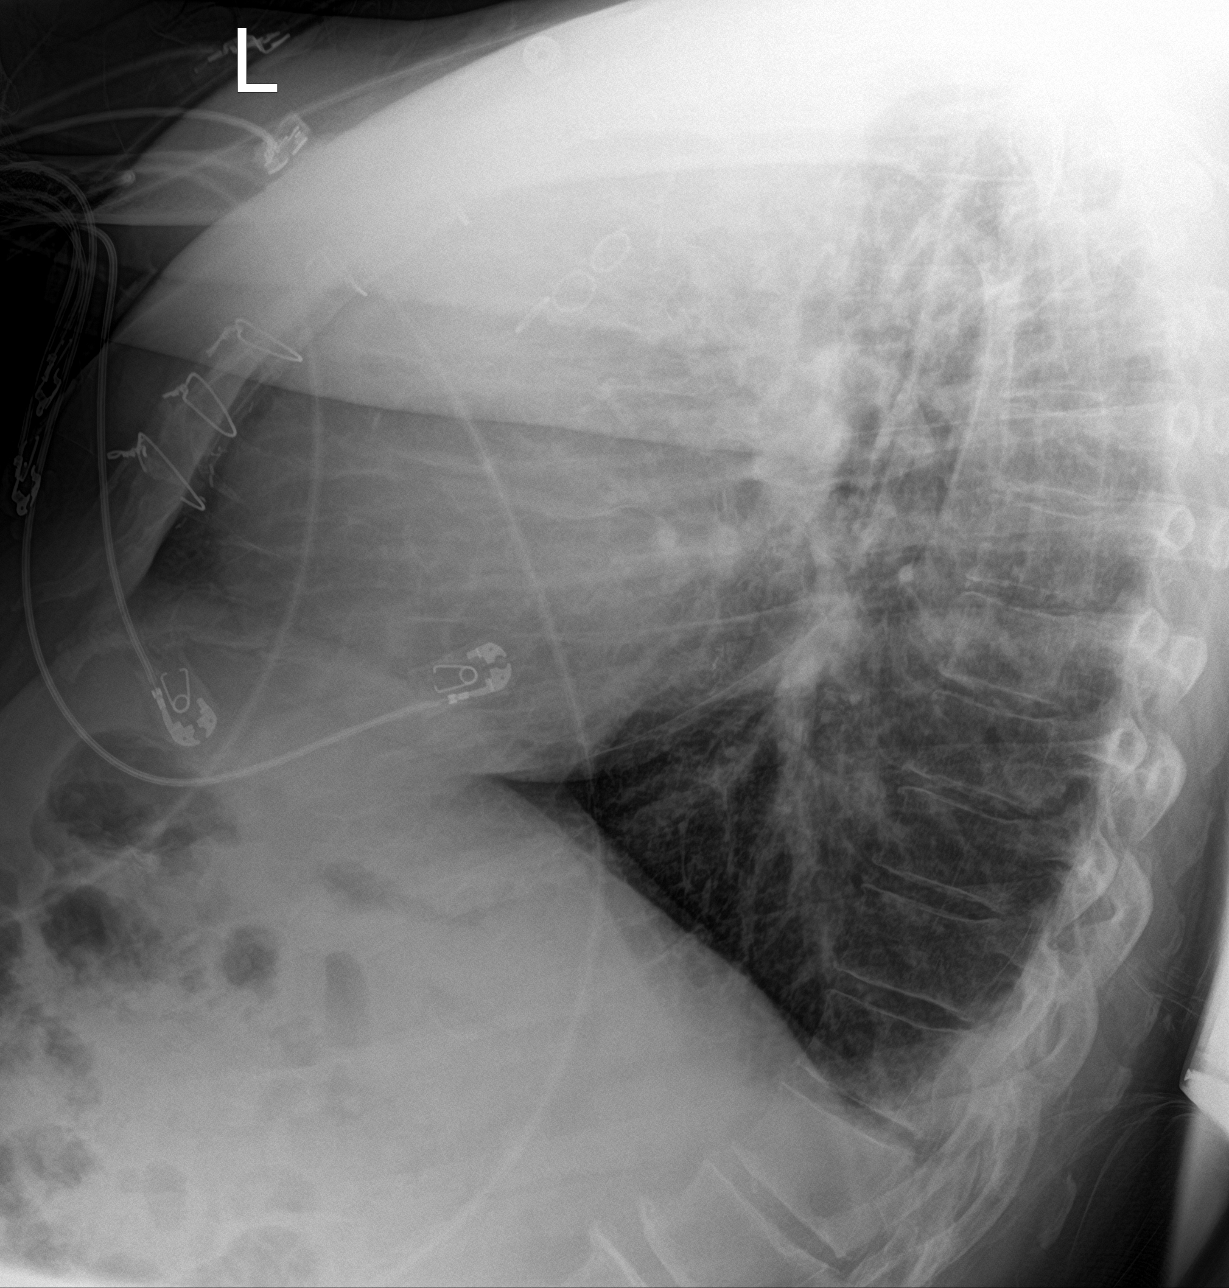

[3 of 3 positions shown; findings below may reference images not displayed]

FINDINGS: Sternotomy wires unchanged. Lordotic technique is demonstrated.
Lungs are adequately inflated without focal airspace consolidation
or effusion. Stable cardiomegaly. Remainder of the exam is
unchanged.
IMPRESSION: No acute cardiopulmonary disease.

Stable cardiomegaly.

## 2018-04-29 ENCOUNTER — Other Ambulatory Visit: Payer: Self-pay

## 2018-04-29 ENCOUNTER — Encounter (HOSPITAL_COMMUNITY): Payer: Self-pay | Admitting: Internal Medicine

## 2018-04-29 ENCOUNTER — Ambulatory Visit (HOSPITAL_COMMUNITY)
Admission: RE | Admit: 2018-04-29 | Discharge: 2018-04-29 | Disposition: A | Discharge status: Home / Self Care | Payer: Medicare HMO | Source: Ambulatory Visit | Attending: Internal Medicine | Admitting: Internal Medicine

## 2018-04-29 VITALS — BP 116/78 | HR 81 | Wt 238.5 lb

## 2018-04-29 DIAGNOSIS — E785 Hyperlipidemia, unspecified: Secondary | ICD-10-CM | POA: Diagnosis not present

## 2018-04-29 DIAGNOSIS — I2510 Atherosclerotic heart disease of native coronary artery without angina pectoris: Secondary | ICD-10-CM | POA: Diagnosis not present

## 2018-04-29 DIAGNOSIS — Z951 Presence of aortocoronary bypass graft: Secondary | ICD-10-CM | POA: Diagnosis not present

## 2018-04-29 DIAGNOSIS — K2270 Barrett's esophagus without dysplasia: Secondary | ICD-10-CM | POA: Diagnosis not present

## 2018-04-29 DIAGNOSIS — I5020 Unspecified systolic (congestive) heart failure: Secondary | ICD-10-CM | POA: Insufficient documentation

## 2018-04-29 DIAGNOSIS — Z87891 Personal history of nicotine dependence: Secondary | ICD-10-CM | POA: Insufficient documentation

## 2018-04-29 DIAGNOSIS — F419 Anxiety disorder, unspecified: Secondary | ICD-10-CM | POA: Diagnosis not present

## 2018-04-29 DIAGNOSIS — K219 Gastro-esophageal reflux disease without esophagitis: Secondary | ICD-10-CM | POA: Insufficient documentation

## 2018-04-29 DIAGNOSIS — I208 Other forms of angina pectoris: Secondary | ICD-10-CM

## 2018-04-29 DIAGNOSIS — Z79899 Other long term (current) drug therapy: Secondary | ICD-10-CM | POA: Insufficient documentation

## 2018-04-29 DIAGNOSIS — I252 Old myocardial infarction: Secondary | ICD-10-CM | POA: Diagnosis not present

## 2018-04-29 DIAGNOSIS — I5022 Chronic systolic (congestive) heart failure: Secondary | ICD-10-CM | POA: Diagnosis not present

## 2018-04-29 DIAGNOSIS — E669 Obesity, unspecified: Secondary | ICD-10-CM | POA: Insufficient documentation

## 2018-04-29 DIAGNOSIS — I255 Ischemic cardiomyopathy: Secondary | ICD-10-CM | POA: Diagnosis not present

## 2018-04-29 DIAGNOSIS — R7303 Prediabetes: Secondary | ICD-10-CM | POA: Insufficient documentation

## 2018-04-29 DIAGNOSIS — Z7951 Long term (current) use of inhaled steroids: Secondary | ICD-10-CM | POA: Diagnosis not present

## 2018-04-29 DIAGNOSIS — I110 Hypertensive heart disease with heart failure: Secondary | ICD-10-CM | POA: Diagnosis not present

## 2018-04-29 DIAGNOSIS — M109 Gout, unspecified: Secondary | ICD-10-CM | POA: Insufficient documentation

## 2018-04-29 MED ORDER — CITALOPRAM HYDROBROMIDE 10 MG PO TABS: 10.0000 mg | ORAL_TABLET | Freq: Every day | ORAL | 6 refills | Status: DC

## 2018-04-29 NOTE — Progress Notes (Signed)
Advanced Heart Failure Clinic Note   Referring Physician: Joni Reining, NP PCP: Eustaquio Boyden, MD PCP-Cardiologist: Bryan Lemma, MD   HPI: Calvin Francis is a 57 y.o. male with a history of CAD s/p 4v CABG in 2005, ICM, HTN, obesity, Barrett's esophagus, and hyperlipidemia.   Referred by Bailey Mech NP for further evaluation of HF and evaluation for possible advanced therapies.  Has h/o CAD with CABG in 2005. Has done well since that time until recently   Admitted 3/5-3/10/19 with CP. LHC as below. 2/4 CABG grafts occluded. No good revascularization options, so medical therapy was advised. His EF on cath was 10-20% with CO 4.8, CI 2.06. Echo 3/6 showed EF 20-25% with diffuse HK, mild RV dysfunction, mild MR, mild biatrial enlargement. He diuresed 2 lbs with IV lasix. HF meds were optimized, but limited with orthostatic hypotension and AKI (creatinine peak 1.3). He was discharged with a lifevest. DC weight 238 lbs.   He was seen at [facility] for hospital follow up on 3/28. Had NYHA III symptoms. He was started on digoxin 0.125 mg daily and was referred to Advanced Heart Failure Clinic.  He is here with his wife for f/u we saw him for the first time last month. Says overall he is feeling better. Breathing better. Says he gets dizzy sometimes when he takes his medicines. But not presyncopal. When he takes his night medicines says sometimes he feels like CP is "on the verge of coming on".  No CP when ambulating. No orthopnea, PND or edema. Sleeps on 2 pillows for Barrett's esophagus. Continues with cramps in his calves when he walks. Has seen vascular docs and they tell him he is OK. Only wearing LifeVest occasionally.    CPX 04/14/18   Pre-Exercise PFTs   FVC 4.35 (79%)    FEV1 3.09 (73%)     FEV1/FVC 71 (92%)     MVV 92 (57%)   RPE: 18 Reason stopped: patient ended test due to leg fatigue and pain (7/10)  Resting HR: 70 Peak HR: 103  (62% age predicted  max HR) BP rest: 116/74 BP peak: 130/76 Peak VO2: 11.1 (43% predicted peak VO2) VE/VCO2 slope: 32 OUES: 1.48 Peak RER: 1.14 Ventilatory Threshold: 8.1 (32% predicted and 73% measured peak VO2) VE/MVV: 47% O2pulse: 11  (65% predicted O2pulse)   R/LHC 03/05/18:  2nd Mrg lesion is 65% stenosed.  Ischemic cardiomyopathy, with acute on chronic systolic heart failure.  Angiographic estimation of EF is 10-20%.  Severely elevated LVEDP and moderately elevated pulmonary capillary wedge pressure.  Severe occlusive native vessel disease with occlusion of the proximal to mid LAD, total occlusion of the proximal to mid circumflex, and total occlusion of the mid RCA.  Bypass graft occlusive disease with total occlusion of the SVG to RCA and SVG to diagonal.  Patent LIMA to the LAD with severe diffuse disease in the LAD distal to the graft insertion site.  Patent free radial to the obtuse marginal with 60-70% distal anastomosis stenosis. Fick Cardiac Output 4.81 L/min  Fick Cardiac Output Index 2.06 (L/min)/BSA  RA A Wave 13 mmHg  RA V Wave 14 mmHg  RA Mean 10 mmHg  RV Systolic Pressure 43 mmHg  RV Diastolic Pressure 4 mmHg  RV EDP 12 mmHg  PA Systolic Pressure 45 mmHg  PA Diastolic Pressure 21 mmHg  PA Mean 31 mmHg  PW A Wave 22 mmHg  PW V Wave 34 mmHg  PW Mean 22 mmHg  AO Systolic Pressure 100  mmHg  AO Diastolic Pressure 67 mmHg  AO Mean 81 mmHg  LV Systolic Pressure 96 mmHg  LV Diastolic Pressure 13 mmHg  LV EDP 26 mmHg    Echo 03/03/18: Left ventricle: The cavity size was severely dilated. Wall   thickness was increased in a pattern of mild LVH. Systolic   function was severely reduced. The estimated ejection fraction   was in the range of 20% to 25%. Diffuse hypokinesis. Doppler   parameters are consistent with restrictive physiology, indicative   of decreased left ventricular diastolic compliance and/or   increased left atrial pressure. Doppler parameters are consistent    with high ventricular filling pressure. - Mitral valve: There was mild regurgitation. - Left atrium: The atrium was mildly dilated. - Right ventricle: The cavity size was mildly dilated. Systolic   function was mildly reduced. - Right atrium: The atrium was mildly dilated.  Echo 2013: EF 40%   Past Medical History:  Diagnosis Date  . 3-vessel CAD 02/22/2004   mid RCA 100% occluded, L-R collaterals; LAD 60-70% bifurcation lesion; Cx proximal 70% and mid 80% after OM1, OM1 100% occluded. --> CABG x 4  . Barrett's esophagus    s/p EGD several years ago with [place]  . Bronchitis, mucopurulent recurrent (HCC)   . CAD (coronary artery disease) of bypass graft 5/'12; 12/'13   Cath for angina and Abn Myoview ST: SVG-OM1 now occluded, attempt at PCI to the native circumflex OM1 unsuccessful; distal LAD beyond patent LIMA 70-80% (not PCI amenable); free radial-OM 2 patent; EF 45-50%  . Erectile dysfunction   . Former heavy cigarette smoker (20-39 per day) May 2013   . GERD (gastroesophageal reflux disease)    ?barrett's, with esophageal dysmotility, on omeprazole  . Glucose intolerance (impaired glucose tolerance)  2009   Prediabetes  . Gout   . History of Non-STEMI (non-ST elevated myocardial infarction) 02/22/2004   Three-vessel disease --> referred for CABG  . History of Non-STEMI (non-ST elevated myocardial infarction) December 2011   Hazy lesion in SVG-OM1 --> staged PCI with 4.0 mm x 20 mm vision BMS (4.5 mm)  . HLD (hyperlipidemia)    Statin intolerant  . Hypertension, benign   . Ischemic cardiomyopathy Echo 10/2012   EF ~40%; moderate Posterior HypoKinesis, minld-moderate inferior Hypokinesis; mild RV dilation, mild concentric LVH  . 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 Stable angina (HCC) 11/2012   Chronic,some what stable but still present; cardiac cath results above, no significant change from 2012  . Testosterone deficiency    On  replacement; goal is low normal.  . Varicose veins December 2013   with venous reflux status VNUS ablation of left greater saphenous wein    Current Outpatient Medications  Medication Sig Dispense Refill  . albuterol (PROVENTIL HFA;VENTOLIN HFA) 108 (90 BASE) MCG/ACT inhaler Inhale 2 puffs into the lungs every 4 (four) hours as needed for wheezing or shortness of breath. 1 Inhaler 0  . ALPRAZolam (XANAX) 0.25 MG tablet Take 1 tablet (0.25 mg total) by mouth 2 (two) times daily as needed for anxiety. 60 tablet 3  . carvedilol (COREG) 3.125 MG tablet Take 1 tablet (3.125 mg total) by mouth 2 (two) times daily with a meal. 60 tablet 6  . co-enzyme Q-10 30 MG capsule Take 1 capsule (30 mg total) by mouth daily. (Patient taking differently: Take 30 mg by mouth 2 (two) times daily. )    . COLCRYS  0.6 MG tablet TAKE AS DIRECTED 2 TABLETS BY MOUTH ON FIRST DAY THEN ONE DAILY AS NEEDED 30 tablet 3  . ezetimibe (ZETIA) 10 MG tablet Take 1 tablet (10 mg total) by mouth daily. 30 tablet 11  . fluorouracil (EFUDEX) 5 % cream Apply topically 2 (two) times daily. 40 g 1  . fluticasone (FLONASE) 50 MCG/ACT nasal spray Place 2 sprays into both nostrils daily. (Patient taking differently: Place 2 sprays into both nostrils as needed for allergies. ) 16 g 1  . furosemide (LASIX) 20 MG tablet Take 1 tablet (20 mg total) by mouth daily. 30 tablet 6  . nitroGLYCERIN (NITROSTAT) 0.4 MG SL tablet Place 1 tablet (0.4 mg total) under the tongue every 5 (five) minutes as needed for chest pain. 25 tablet 0  . omeprazole (PRILOSEC OTC) 20 MG tablet Take 20 mg by mouth daily.    . prasugrel (EFFIENT) 10 MG TABS tablet Take 1 tablet (10 mg total) by mouth daily. 90 tablet 3  . ranolazine (RANEXA) 1000 MG SR tablet Take 1 tablet (1,000 mg total) by mouth twice daily, as directed. 180 tablet 3  . sacubitril-valsartan (ENTRESTO) 24-26 MG Take 1 tablet by mouth 2 (two) times daily. 60 tablet 6  . spironolactone (ALDACTONE) 25 MG  tablet Take 0.5 tablets (12.5 mg total) by mouth daily. 45 tablet 3  . traMADol (ULTRAM) 50 MG tablet Take 1 tablet (50 mg total) by mouth 2 (two) times daily as needed for moderate pain (sedation precautions). 30 tablet 0  . vitamin B-12 (CYANOCOBALAMIN) 500 MCG tablet Take 500 mcg by mouth daily.     No current facility-administered medications for this encounter.     Allergies  Allergen Reactions  . Statins   . Zetia [Ezetimibe]   . Clopidogrel Other (See Comments)    Disoriented   . Codeine Rash      Social History   Socioeconomic History  . Marital status: Married    Spouse name: Not on file  . Number of children: Not on file  . Years of education: Not on file  . Highest education level: Not on file  Occupational History  . Not on file  Social Needs  . Financial resource strain: Not on file  . Food insecurity:    Worry: Not on file    Inability: Not on file  . Transportation needs:    Medical: Not on file    Non-medical: Not on file  Tobacco Use  . Smoking status: Former Smoker    Packs/day: 0.50    Years: 20.00    Pack years: 10.00    Types: Cigarettes    Last attempt to quit: 04/28/2012    Years since quitting: 6.0  . Smokeless tobacco: Never Used  . Tobacco comment: long time smoker, smokes occasional -anxiety  Substance and Sexual Activity  . Alcohol use: Yes    Alcohol/week: 0.0 oz    Comment: occasionally  . Drug use: No  . Sexual activity: Yes  Lifestyle  . Physical activity:    Days per week: Not on file    Minutes per session: Not on file  . Stress: Not on file  Relationships  . Social connections:    Talks on phone: Not on file    Gets together: Not on file    Attends religious service: Not on file    Active member of club or organization: Not on file    Attends meetings of clubs or organizations: Not on file  Relationship status: Not on file  . Intimate partner violence:    Fear of current or ex partner: Not on file    Emotionally  abused: Not on file    Physically abused: Not on file    Forced sexual activity: Not on file  Other Topics Concern  . Not on file  Social History Narrative   Caffeine: 1 cup coffee in am   Lives with wife, 2 dogs   Married, 2 Grown children, 3 grandchildren   Occupation: disability from cardiac status for last year, prior worked for [employer]   Activity: walks driveway, limited by chest pain/SOB    Diet: good water, fruits/vegetables daily      Family History  Problem Relation Age of Onset  . Diabetes Paternal Aunt   . Coronary artery disease Paternal Uncle   . Stroke Maternal Uncle   . Cancer Neg Hx     Vitals:   04/29/18 0941  BP: 116/78  Pulse: 81  SpO2: 99%  Weight: 238 lb 8 oz (108.2 kg)   Filed Weights   04/29/18 0941  Weight: 238 lb 8 oz (108.2 kg)    PHYSICAL EXAM: General:  Well appearing. No resp difficulty HEENT: normal Neck: supple. no JVD. Carotids 2+ bilat; no bruits. No lymphadenopathy or thryomegaly appreciated. Cor: PMI nondisplaced. Regular rate & rhythm. No rubs, gallops or murmurs. Lungs: clear Abdomen: soft, nontender, nondistended. No hepatosplenomegaly. No bruits or masses. Good bowel sounds. Extremities: no cyanosis, clubbing, rash, edema Neuro: alert & orientedx3, cranial nerves grossly intact. moves all 4 extremities w/o difficulty. Affect pleasant   ASSESSMENT & PLAN:  1. Systolic HF due to ICM. Echo 2013: EF 40%, Echo 02/2018: EF 20-25% with diffuse HK, mild RV dysfunction, mild MR, mild biatrial enlargement. EF on cath 10-15% - Symptomatically remains NYHA III  - CPX test reviewed in detail with him and his wife. pVO2 markedly depressed at 11.1 but slope ok at 32. We discussed timing f referral for possible transplant evaluation. Despite his VO2, I think it is still a bit early. Will refer to Rose Fillers for exercise program at [facility] and repeat CPX in 3 months. He knows to contact me if getting worse.  - Volume status ok -  Continue lasix 20 mg daily - Continue coreg 3.125 mg BID - Continue entresto 24/26 mg BID - will not titrate yet with dizziness. Titrate at next visit - Continue spiro 12.5 mg daily. Check labs. - Has Lifevest but is not wearing. Will need repeat echo soon and referral for ICD if EF < 35% - Discussed daily weights, limiting fluid intake, and limiting salt intake.   2. CAD s/p 4v CABG 2005, LHC 02/2018: 2nd Mrg lesion is 65% stenosed, Severe occlusive native vessel disease with occlusion of the proximal to mid LAD, total occlusion of the proximal to mid circumflex, and total occlusion of the mid RCA. Bypass graft occlusive disease with total occlusion of the SVG to RCA and SVG to diagonal. Patent LIMA to the LAD with severe diffuse disease in the LAD distal to the graft insertion site. Patent free radial to the obtuse marginal with 60-70% distal anastomosis stenosis. - Stable - Continue ASA - Unable to tolerate Prvastatin. Has consdiered PCSK-9 but says insurance won't pay for it. - Per [facility] management   3. HTN - Blood pressure well controlled. Continue current regimen.  4. HL - As above  5. Snoring - Has never had a sleep study, but thinks he wouldn't tolerate  CPAP  6. Claudication-like symptoms - has been seen by [facility] and no PAD  7. Anxiety - start Celexa 10mg  daily.   Arvilla Meres, MD 04/29/18

## 2018-04-29 NOTE — Patient Instructions (Signed)
Start Celexa 10 mg daily  You have been referred to Cardiac Rehab, they will call you to schedule  Your physician recommends that you schedule a follow-up appointment in: 4 weeks

## 2018-04-29 NOTE — Addendum Note (Signed)
Encounter addended by: Dolores Patty, MD on: 04/29/2018 11:12 AM  Actions taken: Visit diagnoses modified, LOS modified

## 2018-04-30 ENCOUNTER — Telehealth (HOSPITAL_COMMUNITY): Payer: Self-pay

## 2018-04-30 NOTE — Telephone Encounter (Signed)
Referral received. Patient will be on a cruise for the next couple of weeks. Will call patient for scheduling when he returns. Insurance benefits and eligibility to be determined.

## 2018-05-03 ENCOUNTER — Other Ambulatory Visit: Payer: Self-pay | Admitting: *Deleted

## 2018-05-03 ENCOUNTER — Telehealth: Payer: Self-pay

## 2018-05-03 MED ORDER — PRASUGREL HCL 10 MG PO TABS: 10.0000 mg | ORAL_TABLET | Freq: Every day | ORAL | 3 refills | Status: AC

## 2018-05-03 MED ORDER — FUROSEMIDE 20 MG PO TABS: 20.0000 mg | ORAL_TABLET | Freq: Every day | ORAL | 3 refills | Status: AC

## 2018-05-03 NOTE — Telephone Encounter (Signed)
Spoke w/Calvin Francis about the PREP which Dr. Jesusita Oka referred him to.  Calvin Francis is heading out of town on a cruise next week Thursday for about 10 days.  He will decide between cardiac rehab and the PREP when he gets back home.  I will touch base with him in about 2 weeks.

## 2018-05-04 ENCOUNTER — Other Ambulatory Visit (HOSPITAL_COMMUNITY): Payer: Self-pay | Admitting: *Deleted

## 2018-05-04 MED ORDER — SPIRONOLACTONE 25 MG PO TABS: 12.5000 mg | ORAL_TABLET | Freq: Every day | ORAL | 3 refills | Status: AC

## 2018-05-04 MED ORDER — CARVEDILOL 3.125 MG PO TABS: 3.1250 mg | ORAL_TABLET | Freq: Two times a day (BID) | ORAL | 6 refills | Status: AC

## 2018-05-10 ENCOUNTER — Telehealth: Payer: Self-pay | Admitting: Cardiovascular Disease

## 2018-05-10 NOTE — Telephone Encounter (Signed)
Patient calling the office for samples of medication:   1.  What medication and dosage are you requesting samples for?  sacubitril-valsartan (ENTRESTO) 24-26 MG Take 1 tablet by mouth 2 (two) times daily.   2.  Are you currently out of this medication? Pt will be out of town and the pharmacy will not refill the prescription in advanced, he will run out of the medication while out of town  The sample will carry him until he is back in town to get his full refill

## 2018-05-11 NOTE — Telephone Encounter (Signed)
Call pt and left message that samples are available for pick up at front desk.  Entresto 24/26 mg Qty: 1 box Lot # [id number] Exp: 6/21

## 2018-05-19 ENCOUNTER — Telehealth (HOSPITAL_COMMUNITY): Payer: Self-pay | Admitting: *Deleted

## 2018-05-19 NOTE — Telephone Encounter (Signed)
Received clearance from [facility] for patient to have a tooth extraction.  Patient is cleared to proceed with no further instructions.    Clearance faxed today to [phone].

## 2018-06-01 ENCOUNTER — Encounter (HOSPITAL_COMMUNITY): Payer: Self-pay

## 2018-06-01 ENCOUNTER — Ambulatory Visit (HOSPITAL_COMMUNITY)
Admission: RE | Admit: 2018-06-01 | Discharge: 2018-06-01 | Disposition: A | Discharge status: Home / Self Care | Payer: Medicare HMO | Source: Ambulatory Visit | Attending: Cardiology | Admitting: Cardiology

## 2018-06-01 VITALS — BP 138/86 | HR 93 | Wt 248.4 lb

## 2018-06-01 DIAGNOSIS — Z79899 Other long term (current) drug therapy: Secondary | ICD-10-CM | POA: Insufficient documentation

## 2018-06-01 DIAGNOSIS — I951 Orthostatic hypotension: Secondary | ICD-10-CM | POA: Insufficient documentation

## 2018-06-01 DIAGNOSIS — I10 Essential (primary) hypertension: Secondary | ICD-10-CM | POA: Diagnosis not present

## 2018-06-01 DIAGNOSIS — F419 Anxiety disorder, unspecified: Secondary | ICD-10-CM | POA: Insufficient documentation

## 2018-06-01 DIAGNOSIS — K219 Gastro-esophageal reflux disease without esophagitis: Secondary | ICD-10-CM | POA: Insufficient documentation

## 2018-06-01 DIAGNOSIS — R7303 Prediabetes: Secondary | ICD-10-CM | POA: Diagnosis not present

## 2018-06-01 DIAGNOSIS — I255 Ischemic cardiomyopathy: Secondary | ICD-10-CM | POA: Diagnosis not present

## 2018-06-01 DIAGNOSIS — Z789 Other specified health status: Secondary | ICD-10-CM

## 2018-06-01 DIAGNOSIS — I110 Hypertensive heart disease with heart failure: Secondary | ICD-10-CM | POA: Diagnosis not present

## 2018-06-01 DIAGNOSIS — R252 Cramp and spasm: Secondary | ICD-10-CM | POA: Insufficient documentation

## 2018-06-01 DIAGNOSIS — Z87891 Personal history of nicotine dependence: Secondary | ICD-10-CM | POA: Diagnosis not present

## 2018-06-01 DIAGNOSIS — I5022 Chronic systolic (congestive) heart failure: Secondary | ICD-10-CM

## 2018-06-01 DIAGNOSIS — I2582 Chronic total occlusion of coronary artery: Secondary | ICD-10-CM | POA: Diagnosis not present

## 2018-06-01 DIAGNOSIS — Z7902 Long term (current) use of antithrombotics/antiplatelets: Secondary | ICD-10-CM | POA: Insufficient documentation

## 2018-06-01 DIAGNOSIS — J411 Mucopurulent chronic bronchitis: Secondary | ICD-10-CM | POA: Diagnosis not present

## 2018-06-01 DIAGNOSIS — I5020 Unspecified systolic (congestive) heart failure: Secondary | ICD-10-CM | POA: Diagnosis not present

## 2018-06-01 DIAGNOSIS — R42 Dizziness and giddiness: Secondary | ICD-10-CM | POA: Insufficient documentation

## 2018-06-01 DIAGNOSIS — R0683 Snoring: Secondary | ICD-10-CM | POA: Diagnosis not present

## 2018-06-01 DIAGNOSIS — Z79891 Long term (current) use of opiate analgesic: Secondary | ICD-10-CM | POA: Insufficient documentation

## 2018-06-01 DIAGNOSIS — M109 Gout, unspecified: Secondary | ICD-10-CM | POA: Insufficient documentation

## 2018-06-01 DIAGNOSIS — Z79811 Long term (current) use of aromatase inhibitors: Secondary | ICD-10-CM | POA: Insufficient documentation

## 2018-06-01 DIAGNOSIS — Z885 Allergy status to narcotic agent status: Secondary | ICD-10-CM | POA: Insufficient documentation

## 2018-06-01 DIAGNOSIS — E669 Obesity, unspecified: Secondary | ICD-10-CM | POA: Insufficient documentation

## 2018-06-01 DIAGNOSIS — I25708 Atherosclerosis of coronary artery bypass graft(s), unspecified, with other forms of angina pectoris: Secondary | ICD-10-CM | POA: Diagnosis not present

## 2018-06-01 DIAGNOSIS — E785 Hyperlipidemia, unspecified: Secondary | ICD-10-CM | POA: Insufficient documentation

## 2018-06-01 DIAGNOSIS — I5023 Acute on chronic systolic (congestive) heart failure: Secondary | ICD-10-CM | POA: Insufficient documentation

## 2018-06-01 DIAGNOSIS — N179 Acute kidney failure, unspecified: Secondary | ICD-10-CM | POA: Diagnosis not present

## 2018-06-01 DIAGNOSIS — Z888 Allergy status to other drugs, medicaments and biological substances status: Secondary | ICD-10-CM | POA: Diagnosis not present

## 2018-06-01 DIAGNOSIS — K2270 Barrett's esophagus without dysplasia: Secondary | ICD-10-CM | POA: Diagnosis not present

## 2018-06-01 DIAGNOSIS — I25810 Atherosclerosis of coronary artery bypass graft(s) without angina pectoris: Secondary | ICD-10-CM | POA: Diagnosis not present

## 2018-06-01 LAB — CBC
HEMATOCRIT: 38.3 % — AB (ref 39.0–52.0)
HEMOGLOBIN: 12.6 g/dL — AB (ref 13.0–17.0)
MCH: 28 pg (ref 26.0–34.0)
MCHC: 32.9 g/dL (ref 30.0–36.0)
MCV: 85.1 fL (ref 78.0–100.0)
Platelets: 206 10*3/uL (ref 150–400)
RBC: 4.5 MIL/uL (ref 4.22–5.81)
RDW: 14.8 % (ref 11.5–15.5)
WBC: 5.3 10*3/uL (ref 4.0–10.5)

## 2018-06-01 LAB — BASIC METABOLIC PANEL
ANION GAP: 9 (ref 5–15)
BUN: 10 mg/dL (ref 6–20)
CHLORIDE: 105 mmol/L (ref 101–111)
CO2: 24 mmol/L (ref 22–32)
Calcium: 9.1 mg/dL (ref 8.9–10.3)
Creatinine, Ser: 1.18 mg/dL (ref 0.61–1.24)
GFR calc non Af Amer: >60 mL/min (ref >60)
Glucose, Bld: 98 mg/dL (ref 65–99)
Potassium: 3.9 mmol/L (ref 3.5–5.1)
Sodium: 138 mmol/L (ref 135–145)

## 2018-06-01 NOTE — Patient Instructions (Signed)
STOP Celexa  [facility]  for further evaluation of STATIN therapy and treatment Address: [address], [place], [place] [address] Phone: [phone]   Your physician has requested that you have an echocardiogram. Echocardiography is a painless test that uses sound waves to create images of your heart. It provides your doctor with information about the size and shape of your heart and how well your heart's chambers and valves are working. This procedure takes approximately one hour. There are no restrictions for this procedure.   Your physician recommends that you schedule a follow-up appointment in: 6 weeks with Dr Gala RomneyBensimhon   Do the following things EVERYDAY: 1) Weigh yourself in the morning before breakfast. Write it down and keep it in a log. 2) Take your medicines as prescribed 3) Eat low salt foods-Limit salt (sodium) to 2000 mg per day.  4) Stay as active as you can everyday 5) Limit all fluids for the day to less than 2 liters

## 2018-06-01 NOTE — Progress Notes (Signed)
Advanced Heart Failure Clinic Note   Referring Physician: Joni Reining, NP PCP: Eustaquio Boyden, MD PCP-Cardiologist: Bryan Lemma, MD   HPI: Calvin Francis is a 57 y.o. male with a history of CAD s/p 4v CABG in 2005, ICM, HTN, obesity, Barrett's esophagus, and hyperlipidemia.   Has h/o CAD with CABG in 2005. Has done well since that time until recently   Admitted 3/5-3/10/19 with CP. LHC as below. 2/4 CABG grafts occluded. No good revascularization options, so medical therapy was advised. His EF on cath was 10-20% with CO 4.8, CI 2.06. Echo 3/6 showed EF 20-25% with diffuse HK, mild RV dysfunction, mild MR, mild biatrial enlargement. He diuresed 2 lbs with IV lasix. HF meds were optimized, but limited with orthostatic hypotension and AKI (creatinine peak 1.3). He was discharged with a lifevest. DC weight 238 lbs.   He was seen at [facility] for hospital follow up on 3/28. Had NYHA III symptoms. He was started on digoxin 0.125 mg daily and was referred to [facility] Failure [facility].  Today he returns for HF follow up. Yesterday he stopped zetia and celexa due to leg cramps and dizziness. Overall feeling fine. SOB with inclines.  Denies PND/Orthopnea. Denies CP.  Appetite ok. No fever or chills. Weight at home has gone up a few  pounds.   CPX 04/14/18  Pre-Exercise PFTs FVC 4.35 (79%)    FEV1 3.09 (73%)     FEV1/FVC 71 (92%)     MVV 92 (57%)  RPE: 18 Reason stopped: patient ended test due to leg fatigue and pain (7/10)  Resting HR: 70 Peak HR: 103  (62% age predicted max HR) BP rest: 116/74 BP peak: 130/76 Peak VO2: 11.1 (43% predicted peak VO2) VE/VCO2 slope: 32 OUES: 1.48 Peak RER: 1.14 Ventilatory Threshold: 8.1 (32% predicted and 73% measured peak VO2) VE/MVV: 47% O2pulse: 11  (65% predicted O2pulse)   R/LHC 03/05/18:  2nd Mrg lesion is 65% stenosed.  Ischemic cardiomyopathy, with acute on chronic systolic heart failure.  Angiographic estimation  of EF is 10-20%.  Severely elevated LVEDP and moderately elevated pulmonary capillary wedge pressure.  Severe occlusive native vessel disease with occlusion of the proximal to mid LAD, total occlusion of the proximal to mid circumflex, and total occlusion of the mid RCA.  Bypass graft occlusive disease with total occlusion of the SVG to RCA and SVG to diagonal.  Patent LIMA to the LAD with severe diffuse disease in the LAD distal to the graft insertion site.  Patent free radial to the obtuse marginal with 60-70% distal anastomosis stenosis. Fick Cardiac Output 4.81 L/min  Fick Cardiac Output Index 2.06 (L/min)/BSA  RA A Wave 13 mmHg  RA V Wave 14 mmHg  RA Mean 10 mmHg  RV Systolic Pressure 43 mmHg  RV Diastolic Pressure 4 mmHg  RV EDP 12 mmHg  PA Systolic Pressure 45 mmHg  PA Diastolic Pressure 21 mmHg  PA Mean 31 mmHg  PW A Wave 22 mmHg  PW V Wave 34 mmHg  PW Mean 22 mmHg  AO Systolic Pressure 100 mmHg  AO Diastolic Pressure 67 mmHg  AO Mean 81 mmHg  LV Systolic Pressure 96 mmHg  LV Diastolic Pressure 13 mmHg  LV EDP 26 mmHg    Echo 03/03/18: Left ventricle: The cavity size was severely dilated. Wall   thickness was increased in a pattern of mild LVH. Systolic   function was severely reduced. The estimated ejection fraction   was in the range of 20%  to 25%. Diffuse hypokinesis. Doppler   parameters are consistent with restrictive physiology, indicative   of decreased left ventricular diastolic compliance and/or   increased left atrial pressure. Doppler parameters are consistent   with high ventricular filling pressure. - Mitral valve: There was mild regurgitation. - Left atrium: The atrium was mildly dilated. - Right ventricle: The cavity size was mildly dilated. Systolic   function was mildly reduced. - Right atrium: The atrium was mildly dilated.  Echo 2013: EF 40%   Past Medical History:  Diagnosis Date  . 3-vessel CAD 02/22/2004   mid RCA 100% occluded, L-R  collaterals; LAD 60-70% bifurcation lesion; Cx proximal 70% and mid 80% after OM1, OM1 100% occluded. --> CABG x 4  . Barrett's esophagus    s/p EGD several years ago with [facility]  . Bronchitis, mucopurulent recurrent (HCC)   . CAD (coronary artery disease) of bypass graft 5/'12; 12/'13   Cath for angina and Abn Myoview ST: SVG-OM1 now occluded, attempt at PCI to the native circumflex OM1 unsuccessful; distal LAD beyond patent LIMA 70-80% (not PCI amenable); free radial-OM 2 patent; EF 45-50%  . Erectile dysfunction   . Former heavy cigarette smoker (20-39 per day) May 2013   . GERD (gastroesophageal reflux disease)    ?barrett's, with esophageal dysmotility, on omeprazole  . Glucose intolerance (impaired glucose tolerance)  2009   Prediabetes  . Gout   . History of Non-STEMI (non-ST elevated myocardial infarction) 02/22/2004   Three-vessel disease --> referred for CABG  . History of Non-STEMI (non-ST elevated myocardial infarction) December 2011   Hazy lesion in SVG-OM1 --> staged PCI with 4.0 mm x 20 mm vision BMS (4.5 mm)  . HLD (hyperlipidemia)    Statin intolerant  . Hypertension, benign   . Ischemic cardiomyopathy Echo 10/2012   EF ~40%; moderate Posterior HypoKinesis, minld-moderate inferior Hypokinesis; mild RV dilation, mild concentric LVH  . 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 Stable angina (HCC) 11/2012   Chronic,some what stable but still present; cardiac cath results above, no significant change from 2012  . Testosterone deficiency    On replacement; goal is low normal.  . Varicose veins December 2013   with venous reflux status VNUS ablation of left greater saphenous wein    Current Outpatient Medications  Medication Sig Dispense Refill  . albuterol (PROVENTIL HFA;VENTOLIN HFA) 108 (90 BASE) MCG/ACT inhaler Inhale 2 puffs into the lungs every 4 (four) hours as needed for wheezing or shortness of breath. 1 Inhaler 0  . ALPRAZolam  (XANAX) 0.25 MG tablet Take 1 tablet (0.25 mg total) by mouth 2 (two) times daily as needed for anxiety. 60 tablet 3  . carvedilol (COREG) 3.125 MG tablet Take 1 tablet (3.125 mg total) by mouth 2 (two) times daily with a meal. 180 tablet 6  . citalopram (CELEXA) 10 MG tablet Take 1 tablet (10 mg total) by mouth daily. 30 tablet 6  . co-enzyme Q-10 30 MG capsule Take 1 capsule (30 mg total) by mouth daily. (Patient taking differently: Take 30 mg by mouth 2 (two) times daily. )    . COLCRYS 0.6 MG tablet TAKE AS DIRECTED 2 TABLETS BY MOUTH ON FIRST DAY THEN ONE DAILY AS NEEDED 30 tablet 3  . ezetimibe (ZETIA) 10 MG tablet Take 1 tablet (10 mg total) by mouth daily. 30 tablet 11  . fluorouracil (EFUDEX) 5 % cream Apply topically 2 (two) times daily. 40  g 1  . fluticasone (FLONASE) 50 MCG/ACT nasal spray Place 2 sprays into both nostrils daily. (Patient taking differently: Place 2 sprays into both nostrils as needed for allergies. ) 16 g 1  . furosemide (LASIX) 20 MG tablet Take 1 tablet (20 mg total) by mouth daily. 90 tablet 3  . nitroGLYCERIN (NITROSTAT) 0.4 MG SL tablet Place 1 tablet (0.4 mg total) under the tongue every 5 (five) minutes as needed for chest pain. 25 tablet 0  . omeprazole (PRILOSEC OTC) 20 MG tablet Take 20 mg by mouth daily.    . prasugrel (EFFIENT) 10 MG TABS tablet Take 1 tablet (10 mg total) by mouth daily. 90 tablet 3  . ranolazine (RANEXA) 1000 MG SR tablet Take 1 tablet (1,000 mg total) by mouth twice daily, as directed. 180 tablet 3  . sacubitril-valsartan (ENTRESTO) 24-26 MG Take 1 tablet by mouth 2 (two) times daily. 60 tablet 6  . spironolactone (ALDACTONE) 25 MG tablet Take 0.5 tablets (12.5 mg total) by mouth daily. 45 tablet 3  . traMADol (ULTRAM) 50 MG tablet Take 1 tablet (50 mg total) by mouth 2 (two) times daily as needed for moderate pain (sedation precautions). 30 tablet 0  . vitamin B-12 (CYANOCOBALAMIN) 500 MCG tablet Take 500 mcg by mouth daily.     No  current facility-administered medications for this visit.     Allergies  Allergen Reactions  . Statins   . Zetia [Ezetimibe]   . Clopidogrel Other (See Comments)    Disoriented   . Codeine Rash      Social History   Socioeconomic History  . Marital status: Married    Spouse name: Not on file  . Number of children: Not on file  . Years of education: Not on file  . Highest education level: Not on file  Occupational History  . Not on file  Social Needs  . Financial resource strain: Not on file  . Food insecurity:    Worry: Not on file    Inability: Not on file  . Transportation needs:    Medical: Not on file    Non-medical: Not on file  Tobacco Use  . Smoking status: Former Smoker    Packs/day: 0.50    Years: 20.00    Pack years: 10.00    Types: Cigarettes    Last attempt to quit: 04/28/2012    Years since quitting: 6.0  . Smokeless tobacco: Never Used  . Tobacco comment: long time smoker, smokes occasional -anxiety  Substance and Sexual Activity  . Alcohol use: Yes    Alcohol/week: 0.0 oz    Comment: occasionally  . Drug use: No  . Sexual activity: Yes  Lifestyle  . Physical activity:    Days per week: Not on file    Minutes per session: Not on file  . Stress: Not on file  Relationships  . Social connections:    Talks on phone: Not on file    Gets together: Not on file    Attends religious service: Not on file    Active member of club or organization: Not on file    Attends meetings of clubs or organizations: Not on file    Relationship status: Not on file  . Intimate partner violence:    Fear of current or ex partner: Not on file    Emotionally abused: Not on file    Physically abused: Not on file    Forced sexual activity: Not on file  Other Topics Concern  .  Not on file  Social History Narrative   Caffeine: 1 cup coffee in am   Lives with wife, 2 dogs   Married, 2 Grown children, 3 grandchildren   Occupation: disability from cardiac status for  last year, prior worked for [employer]   Activity: walks driveway, limited by chest pain/SOB    Diet: good water, fruits/vegetables daily      Family History  Problem Relation Age of Onset  . Diabetes Paternal Aunt   . Coronary artery disease Paternal Uncle   . Stroke Maternal Uncle   . Cancer Neg Hx     Vitals:   06/01/18 1109  BP: 138/86  Pulse: 93  SpO2: 96%  Weight: 248 lb 6.4 oz (112.7 kg)   Filed Weights   06/01/18 1109  Weight: 248 lb 6.4 oz (112.7 kg)   This SmartLink has not been configured with any valid records.   Wt Readings from Last 3 Encounters:  06/01/18 248 lb 6.4 oz (112.7 kg)  04/29/18 238 lb 8 oz (108.2 kg)  03/30/18 241 lb 6 oz (109.5 kg)    PHYSICAL EXAM: General:  Well appearing. No resp difficulty HEENT: normal Neck: supple. no JVD. Carotids 2+ bilat; no bruits. No lymphadenopathy or thryomegaly appreciated. Cor: PMI nondisplaced. Regular rate & rhythm. No rubs, gallops or murmurs. Lungs: clear Abdomen: soft, nontender, nondistended. No hepatosplenomegaly. No bruits or masses. Good bowel sounds. Extremities: no cyanosis, clubbing, rash, edema Neuro: alert & orientedx3, cranial nerves grossly intact. moves all 4 extremities w/o difficulty. Affect pleasant   ASSESSMENT & PLAN:  1. Systolic HF due to ICM. Echo 2013: EF 40%, Echo 02/2018: EF 20-25% with diffuse HK, mild RV dysfunction, mild MR, mild biatrial enlargement. EF on cath 10-15% - Symptomatically remains NYHA III  - CPX test reviewed in detail with him and his wife. pVO2 markedly depressed at 11.1 but slope ok at 32. We discussed timing f referral for possible transplant evaluation. - NYHA II-III. Dizzy but he is not orthostatic. Volume status stable.   - Continue lasix 20 mg daily - Continue coreg 3.125 mg BID - Continue entresto 24/26 mg BID - - Continue spiro 12.5 mg daily. Check labs. - He sent Life Vest back. Plan to repeat ECHO in 6 weeks.   -No medication changes due to  dizziness.   2. CAD s/p 4v CABG 2005, LHC 02/2018: 2nd Mrg lesion is 65% stenosed, Severe occlusive native vessel disease with occlusion of the proximal to mid LAD, total occlusion of the proximal to mid circumflex, and total occlusion of the mid RCA. Bypass graft occlusive disease with total occlusion of the SVG to RCA and SVG to diagonal. Patent LIMA to the LAD with severe diffuse disease in the LAD distal to the graft insertion site. Patent free radial to the obtuse marginal with 60-70% distal anastomosis stenosis. - No s/s ischemia  - Continue ASA - Unable to tolerate Prvastatin. Has consdiered [facility]-9 but says insurance won't pay for it. - Per [facility] management   3. HTN - Stable  4. HL Off zetia with leg cramps.  - Refer to [facility]  5. Snoring - Discuss sleep study at the next visit.   6. Claudication-like symptoms - has been seen by [facility] and no PAD  7. Anxiety Off celexa due to dizziness.   I have discussed referral to Lipid Clinic and current plan. Instructed to call PCP if dizziness persists.   Greater than 50% of the (total minutes 25) visit spent in  counseling/coordination of care regarding heart failure, low salt food choices, limiting fluid intake to < 2 liters per day.   Follow up in 6 weeks.    Tonye BecketAmy Sayyid Harewood, NP 06/01/18

## 2018-06-02 ENCOUNTER — Telehealth: Payer: Self-pay

## 2018-06-02 ENCOUNTER — Telehealth (HOSPITAL_COMMUNITY): Payer: Self-pay

## 2018-06-02 NOTE — Telephone Encounter (Signed)
Left a VM for [name] requesting him to call me in regards to PREP referral to see if he's interested in participating in this exercise program.

## 2018-06-02 NOTE — Telephone Encounter (Signed)
Attempted to call patient in regards to Cardiac Rehab - lm on vm °

## 2018-06-16 ENCOUNTER — Telehealth (HOSPITAL_COMMUNITY): Payer: Self-pay

## 2018-06-16 ENCOUNTER — Encounter (HOSPITAL_COMMUNITY): Payer: Self-pay

## 2018-06-16 NOTE — Telephone Encounter (Signed)
2nd attempt to contact patient in regards to Cardiac Rehab - lm on vm. Sending letter. °

## 2018-06-28 ENCOUNTER — Telehealth (HOSPITAL_COMMUNITY): Payer: Self-pay

## 2018-06-28 NOTE — Telephone Encounter (Signed)
Called and spoke with patient in regards to Cardiac Rehab - Patient stated he would like to wait on participating at the time until he is finished with his ECHO on 07/13/18. Will follow up with patient.

## 2018-07-12 ENCOUNTER — Ambulatory Visit: Payer: Medicare HMO | Admitting: Cardiovascular Disease

## 2018-07-12 VITALS — BP 94/60 | HR 89 | Ht 73.5 in | Wt 250.6 lb

## 2018-07-12 DIAGNOSIS — I5042 Chronic combined systolic (congestive) and diastolic (congestive) heart failure: Secondary | ICD-10-CM | POA: Diagnosis not present

## 2018-07-12 DIAGNOSIS — I25708 Atherosclerosis of coronary artery bypass graft(s), unspecified, with other forms of angina pectoris: Secondary | ICD-10-CM

## 2018-07-12 DIAGNOSIS — E785 Hyperlipidemia, unspecified: Secondary | ICD-10-CM

## 2018-07-12 DIAGNOSIS — Z79899 Other long term (current) drug therapy: Secondary | ICD-10-CM | POA: Diagnosis not present

## 2018-07-12 DIAGNOSIS — I255 Ischemic cardiomyopathy: Secondary | ICD-10-CM | POA: Diagnosis not present

## 2018-07-12 DIAGNOSIS — I83892 Varicose veins of left lower extremities with other complications: Secondary | ICD-10-CM

## 2018-07-12 MED ORDER — PRAVASTATIN SODIUM 20 MG PO TABS: 20.0000 mg | ORAL_TABLET | Freq: Every evening | ORAL | 3 refills | Status: DC

## 2018-07-12 MED ORDER — EZETIMIBE 10 MG PO TABS: 10.0000 mg | ORAL_TABLET | Freq: Every day | ORAL | 3 refills | Status: DC

## 2018-07-12 NOTE — Patient Instructions (Signed)
Medication Instructions:  START Zetia 10 mg daily Decrease pravastatin to 20 mg daily  Labwork: Please return for FASTING labs in 3 months (CMET,Lipid)  Our in office lab hours are Monday-Friday 8:00-4:00, closed for lunch 12:45-1:45 pm.  No appointment needed.  Follow-Up: 4 months with Dr. Tresa EndoKelly  Any Other Special Instructions Will Be Listed Below (If Applicable).     If you need a refill on your cardiac medications before your next appointment, please call your pharmacy.

## 2018-07-12 NOTE — Progress Notes (Signed)
Cardiology Office Note    Date:  07/19/2018   ID:  Camron, Monday 1961-06-11, MRN [id number]  PCP:  Calvin Bush, MD  Cardiologist:  Calvin Majestic, MD Calvin Francis (AH[facility])  New office evaluation with me.  History of Present Illness:  Calvin Francis is a 57 y.o. male presents to the office to establish cardiology care with me.  Remotely had seen Dr. Ellyn Francis is being followed by the advanced heart failure team.  Calvin Francis has a history of CAD and is status post CABG revascularization surgery x4 in 2005.  He has a history of an ischemic cardiomyopathy, hypertension, Barrett's esophagus in addition to hyperlipidemia.  He was admitted to hospital in March 2019 chest pain.  Repeat cardiac catheterization showed an EF of 10 to 20% with severely elevated LVEDP and PA pressure at 45/21.  Severe occlusive native vessel disease with occlusion of the proximal to mid LAD, total occlusion of the proximal to mid circumflex in addition to the mid RCA.  2 of his 4 past grafts were occluded entheses SVG to RCA, and SVG to diagonal; but he had a patent LIMA to the LAD with severe diffuse disease in the LAD distal to the graft anastomosis and he had a patent free radial graft to the OM vessel with 60 to 70% distal anastomosis stenosis.  He was treated with diuresis and attempt to optimize medical therapy was limited by orthostatic hypotension and renal insufficiency.  He was discharged with a LifeVest.  Is been followed by the advanced heart failure team.  An echo Doppler study in on March 03, 2018 that showed an EF of 20 to 25%.  He last saw Calvin Francis on Apr 29, 2018 he had undergone a CPX test showed a markedly reduced VO2 at 11.1 and adequate slope at 13.  He has been maintained on carvedilol 3.125 twice daily, Entresto 24/26 twice daily in addition to spironolactone.  Prior to his March hospitalization he was experiencing at least 3-4 episodes of chest pain per day.  Most recently this has significantly  improved with only an isolated episode.  I had rounded on him on one occasion in the hospital in March and apparently he had requested follow-up cardiology care with me and presents to my office today for evaluation.  Past Medical History:  Diagnosis Date  . 3-vessel CAD 02/22/2004   mid RCA 100% occluded, L-R collaterals; LAD 60-70% bifurcation lesion; Cx proximal 70% and mid 80% after OM1, OM1 100% occluded. --> CABG x 4  . Barrett's esophagus    s/p EGD several years ago with [facility]  . Bronchitis, mucopurulent recurrent ([place])   . CAD (coronary artery disease) of bypass graft 5/'12; 12/'13   Cath for angina and Abn Myoview ST: SVG-OM1 now occluded, attempt at PCI to the native circumflex OM1 unsuccessful; distal LAD beyond patent LIMA 70-80% (not PCI amenable); free radial-OM 2 patent; EF 45-50%  . Erectile dysfunction   . Former heavy cigarette smoker (20-39 per day) May 2013   . GERD (gastroesophageal reflux disease)    ?barrett's, with esophageal dysmotility, on omeprazole  . Glucose intolerance (impaired glucose tolerance)  2009   Prediabetes  . Gout   . History of Non-STEMI (non-ST elevated myocardial infarction) 02/22/2004   Three-vessel disease --> referred for CABG  . History of Non-STEMI (non-ST elevated myocardial infarction) December 2011   Hazy lesion in SVG-OM1 --> staged PCI with 4.0 mm x 20 mm vision BMS (4.5 mm)  .  HLD (hyperlipidemia)    Statin intolerant  . Hypertension, benign   . Ischemic cardiomyopathy Echo 10/2012   EF ~40%; moderate Posterior HypoKinesis, minld-moderate inferior Hypokinesis; mild RV dilation, mild concentric LVH  . 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 Stable angina ([place]) 11/2012   Chronic,some what stable but still present; cardiac cath results above, no significant change from 2012  . Testosterone deficiency    On replacement; goal is low normal.  . Varicose veins December 2013   with venous reflux  status VNUS ablation of left greater saphenous wein    Past Surgical History:  Procedure Laterality Date  . CARDIAC CATHETERIZATION  May 01 2011   knowwn occlusion of SVG to RCA  as well as native RCA ; PCI to the SVG to OM1 ;patent LIMA to LAD with diffuse distal  LAD 70-80%,small  vessel dx not thought to be amenable to PCI .RCA 100% occluded ,OM2 patent,Circ diseased aftr OM1. EF 45-50%  . CORONARY ANGIOPLASTY WITH STENT PLACEMENT  12/25/2010   s/p autologous vessel blockage  . CORONARY ARTERY BYPASS GRAFT  02/27/04   LIMA -LAD, Free Radial-OM2 -- patent; SVG-OM1, SVG-RCA, - 100% occluded by recent cath  . ESOPHAGOGASTRODUODENOSCOPY    . LEFT HEART CATHETERIZATION WITH CORONARY/GRAFT ANGIOGRAM N/A 12/27/2012   Procedure: LEFT HEART CATHETERIZATION WITH Beatrix Fetters;  Surgeon: Calvin Man, MD;  Location: [facility] CATH LAB: Known nRCA, mLAD, OM1 & OM2 100%, Known SVG-RCA 100%, SVG-OM1 100% ISR. Patent LIMA-dLAD w/ 60-80% dLAD. Patent frRAD-OM2. LCx-RPL collaterals.   EF ~35%  . lower venous extremity doppler Left 08/15/2011   normal ,s/p vein ablation  . MET/CPET  11/02/2012   suboptimal effort but peak VO2  was 52% which relatively significant. Myoview was suggestive for ant. ischemia may go along with his distal LAD   . NM MYOCAR PERF WALL MOTION  12/15/2012   high risk study  . RIGHT/LEFT HEART CATH AND CORONARY/GRAFT ANGIOGRAPHY N/A 03/05/2018   Procedure: RIGHT/LEFT HEART CATH AND CORONARY/GRAFT ANGIOGRAPHY;  Surgeon: Calvin Crome, MD;  Location: [facility];  Service: Cardiovascular;  Laterality: N/A;  . TRANSTHORACIC ECHOCARDIOGRAM  10/2012   mild LVH, EF 40%, mod posterior and inf wall hypokinesis,mild concentric LVH;; RV dilated, normal  fx.trace MR  . VEIN SURGERY  2011   solomon - h/o vericose veins    Current Medications: Outpatient Medications Prior to Visit  Medication Sig Dispense Refill  . ALPRAZolam (XANAX) 0.25 MG tablet Take 1 tablet (0.25 mg total) by  mouth 2 (two) times daily as needed for anxiety. 60 tablet 3  . carvedilol (COREG) 3.125 MG tablet Take 1 tablet (3.125 mg total) by mouth 2 (two) times daily with a meal. 180 tablet 6  . co-enzyme Q-10 30 MG capsule Take 1 capsule (30 mg total) by mouth daily. (Patient taking differently: Take 30 mg by mouth 2 (two) times daily. )    . COLCRYS 0.6 MG tablet TAKE AS DIRECTED 2 TABLETS BY MOUTH ON FIRST DAY THEN ONE DAILY AS NEEDED 30 tablet 3  . fluorouracil (EFUDEX) 5 % cream Apply topically 2 (two) times daily. 40 g 1  . fluticasone (FLONASE) 50 MCG/ACT nasal spray Place 2 sprays into both nostrils daily. (Patient taking differently: Place 2 sprays into both nostrils as needed for allergies. ) 16 g 1  . furosemide (LASIX) 20 MG tablet Take 1 tablet (20 mg total) by mouth daily. [place]  tablet 3  . nitroGLYCERIN (NITROSTAT) 0.4 MG SL tablet Place 1 tablet (0.4 mg total) under the tongue every 5 (five) minutes as needed for chest pain. 25 tablet 0  . omeprazole (PRILOSEC OTC) 20 MG tablet Take 20 mg by mouth daily.    . prasugrel (EFFIENT) 10 MG TABS tablet Take 1 tablet (10 mg total) by mouth daily. 90 tablet 3  . ranolazine (RANEXA) 1000 MG SR tablet Take 1 tablet (1,000 mg total) by mouth twice daily, as directed. 180 tablet 3  . sacubitril-valsartan (ENTRESTO) 24-26 MG Take 1 tablet by mouth 2 (two) times daily. 60 tablet 6  . spironolactone (ALDACTONE) 25 MG tablet Take 0.5 tablets (12.5 mg total) by mouth daily. 45 tablet 3  . vitamin B-12 (CYANOCOBALAMIN) 500 MCG tablet Take 500 mcg by mouth daily.    Marland Kitchen albuterol (PROVENTIL HFA;VENTOLIN HFA) 108 (90 BASE) MCG/ACT inhaler Inhale 2 puffs into the lungs every 4 (four) hours as needed for wheezing or shortness of breath. 1 Inhaler 0  . citalopram (CELEXA) 10 MG tablet Take 1 tablet (10 mg total) by mouth daily. (Patient not taking: Reported on 07/12/2018) 30 tablet 6  . ezetimibe (ZETIA) 10 MG tablet Take 1 tablet (10 mg total) by mouth daily. 30 tablet  11  . traMADol (ULTRAM) 50 MG tablet Take 1 tablet (50 mg total) by mouth 2 (two) times daily as needed for moderate pain (sedation precautions). (Patient not taking: Reported on 07/12/2018) 30 tablet 0   No facility-administered medications prior to visit.      Allergies:   Statins; Zetia [ezetimibe]; Clopidogrel; and Codeine   Social History   Socioeconomic History  . Marital status: Married    Spouse name: Not on file  . Number of children: Not on file  . Years of education: Not on file  . Highest education level: Not on file  Occupational History  . Not on file  Social Needs  . Financial resource strain: Not on file  . Food insecurity:    Worry: Not on file    Inability: Not on file  . Transportation needs:    Medical: Not on file    Non-medical: Not on file  Tobacco Use  . Smoking status: Former Smoker    Packs/day: 0.50    Years: 20.00    Pack years: 10.00    Types: Cigarettes    Last attempt to quit: 04/28/2012    Years since quitting: 6.2  . Smokeless tobacco: Never Used  . Tobacco comment: long time smoker, smokes occasional -anxiety  Substance and Sexual Activity  . Alcohol use: Yes    Alcohol/week: 0.0 oz    Comment: occasionally  . Drug use: No  . Sexual activity: Yes  Lifestyle  . Physical activity:    Days per week: Not on file    Minutes per session: Not on file  . Stress: Not on file  Relationships  . Social connections:    Talks on phone: Not on file    Gets together: Not on file    Attends religious service: Not on file    Active member of club or organization: Not on file    Attends meetings of clubs or organizations: Not on file    Relationship status: Not on file  Other Topics Concern  . Not on file  Social History Narrative   Caffeine: 1 cup coffee in am   Lives with wife, 2 dogs   Married, 2 Grown children, 3 grandchildren  Occupation: disability from cardiac status for last year, prior worked for [employer]   Activity: walks  driveway, limited by chest pain/SOB    Diet: good water, fruits/vegetables daily     Family History:  The patient's family history includes Coronary artery disease in his paternal uncle; Diabetes in his paternal aunt; Stroke in his maternal uncle.   ROS General: Negative; No fevers, chills, or night sweats;  HEENT: Negative; No changes in vision or hearing, sinus congestion, difficulty swallowing Pulmonary: Negative; No cough, wheezing, shortness of breath, hemoptysis Cardiovascular: see HPI  Remote SFA stent on the left GI: Negative; No nausea, vomiting, diarrhea, or abdominal pain GU: Negative; No dysuria, hematuria, or difficulty voiding Musculoskeletal: Negative; no myalgias, joint pain, or weakness Hematologic/Oncology: Negative; no easy bruising, bleeding Endocrine: Negative; no heat/cold intolerance; no diabetes Neuro: Negative; no changes in balance, headaches Skin: Negative; No rashes or skin lesions Psychiatric: Negative; No behavioral problems, depression Sleep: Negative; No snoring, daytime sleepiness, hypersomnolence, bruxism, restless legs, hypnogognic hallucinations, no cataplexy Other comprehensive 14 point system review is negative.   PHYSICAL EXAM:   VS:  BP 94/60   Pulse 89   Ht 6' 1.5" (1.867 m)   Wt 250 lb 9.6 oz (113.7 kg)   BMI 32.61 kg/m     Repeat blood pressure by me was 102/64 supine and 104/64 standing.  Wt Readings from Last 3 Encounters:  07/12/18 250 lb 9.6 oz (113.7 kg)  06/01/18 248 lb 6.4 oz (112.7 kg)  04/29/18 238 lb 8 oz (108.2 kg)    General: Alert, oriented, no distress.  Skin: normal turgor, no rashes, warm and dry HEENT: Normocephalic, atraumatic. Pupils equal round and reactive to light; sclera anicteric; extraocular muscles intact; Fundi no hemorrhages or exudates.  Discs flat Nose without nasal septal hypertrophy Mouth/Parynx benign; Mallinpatti scale 3 Neck: No JVD, no carotid bruits; normal carotid upstroke Lungs: clear to  ausculatation and percussion; no wheezing or rales Chest wall: without tenderness to palpitation Heart: PMI not displaced, RRR, s1 s2 normal, 1/6 systolic murmur, no diastolic murmur, no rubs, gallops, thrills, or heaves Abdomen: soft, nontender; no hepatosplenomehaly, BS+; abdominal aorta nontender and not dilated by palpation. Back: no CVA tenderness Pulses 2+ Musculoskeletal: full range of motion, normal strength, no joint deformities Extremities: Left lower extremity varicosities with telangiectasia;  no clubbing cyanosis or edema, Homan's sign negative  Neurologic: grossly nonfocal; Cranial nerves grossly wnl Psychologic: Normal mood and affect   Studies/Labs Reviewed:   EKG:  EKG is ordered today.  ECG (independently read by me): Sinus rhythm 89 bpm.  Left atrial enlargement.  Right axis deviation.  QTc increase at 481 ms.  Nonspecific ST changes.  Recent Labs: BMP Latest Ref Rng & Units 06/01/2018 04/14/2018 03/30/2018  Glucose 65 - 99 mg/dL 98 89 91  BUN 6 - 20 mg/dL 10 7 8   Creatinine 0.61 - 1.24 mg/dL 1.18 1.15 0.93  BUN/Creat Ratio 6 - 22 (calc) - - -  Sodium 135 - 145 mmol/L 138 138 135  Potassium 3.5 - 5.1 mmol/L 3.9 4.0 4.0  Chloride 101 - 111 mmol/L 105 106 103  CO2 22 - 32 mmol/L 24 24 21(L)  Calcium 8.9 - 10.3 mg/dL 9.1 8.9 9.2     Hepatic Function Latest Ref Rng & Units 03/02/2018 10/13/2017 02/03/2017  Total Protein 6.5 - 8.1 g/dL 6.9 6.3 6.4  Albumin 3.5 - 5.0 g/dL 4.1 - 4.4  AST 15 - 41 U/L 21 17 22   ALT 17 - 63  U/L 19 15 20   Alk Phosphatase 38 - 126 U/L 58 - 55  Total Bilirubin 0.3 - 1.2 mg/dL 0.6 0.4 0.5    CBC Latest Ref Rng & Units 06/01/2018 03/07/2018 03/06/2018  WBC 4.0 - 10.5 K/uL 5.3 7.5 6.2  Hemoglobin 13.0 - 17.0 g/dL 12.6(L) 13.8 12.0(L)  Hematocrit 39.0 - 52.0 % 38.3(L) 41.2 36.2(L)  Platelets 150 - 400 K/uL 206 205 188   Lab Results  Component Value Date   MCV 85.1 06/01/2018   MCV 84.3 03/07/2018   MCV 85.4 03/06/2018   Lab Results  Component  Value Date   TSH 0.95 07/18/2011   Lab Results  Component Value Date   HGBA1C 5.5 10/13/2017     BNP    Component Value Date/Time   BNP 1,026.3 (H) 03/05/2018 0608    ProBNP    Component Value Date/Time   PROBNP 67.0 12/22/2010 0856     Lipid Panel     Component Value Date/Time   CHOL 174 03/03/2018 0215   TRIG 151 (H) 03/03/2018 0215   HDL 33 (L) 03/03/2018 0215   CHOLHDL 5.3 03/03/2018 0215   VLDL 30 03/03/2018 0215   LDLCALC 111 (H) 03/03/2018 0215   LDLCALC 122 (H) 10/13/2017 0835   LDLDIRECT 158.0 09/29/2016 1008     RADIOLOGY: No results found.   Additional studies/ records that were reviewed today include:  I reviewed the patient's prior medical records, cardiac catheterization, hospital assessment, noninvasive studies and advance heart clinic notes.   ASSESSMENT:    1. Coronary artery disease of bypass graft of native heart with stable angina pectoris ([place])   2. Hyperlipidemia with target LDL less than 70 with statin related symptoms   3. Chronic combined systolic and diastolic congestive heart failure ([place])   4. Medication management   5. Ischemic cardiomyopathy   6. Varicose veins of left leg with edema      PLAN:  Mr. Calyx Hawker is a 57 year old gentleman who underwent CABG revascularization surgery in 2005 with a LIMA to LAD, free radial graft to his OM vessel, SVG to his RCA and SVG to the diagonal vessel.  All venous conduits have occluded.  Upon presentation in March 2019 he was found to have an EF of 10 to 20% at catheterization in 20 to 25% on echo Doppler assessment.  Remotely, he had seen Dr. Rex Kras and later Dr. Ellyn Francis heard presently, his anginal symptomatology has improved with initiation of carvedilol 3.125 mg twice daily, ranolazine 1000 mg twice daily.  He is on Entresto 24/26 twice daily, spironolactone 12.5 mg daily addition to his carvedilol for his significant LV dysfunction.  He also is on aspirin and Effient for dual  antiplatelet therapy.  Remotely he had issues with statin intolerance but it does appear that the past he may have been able to tolerate low-dose pravastatin.  Laboratory in March 2019 had shown an LDL of 111.  Remotely had been on pravastatin 40 mg and recently has been taking this only every other every third day.  I have suggested he reduce his pravastatin to 20 mg daily and will resume a trial of Zetia 10 mg should provide additional 20% LDL lowering.  If LDL cannot get below 70 with his extensive CAD he is a candidate for PCSK9 inhibition with Repatha.  His blood pressure today does not show significant orthostatic drop but is on the low side when rechecked by me.  He is not a candidate presently for further titration of  his Delene Loll due to his low blood pressure.  He is scheduled to undergo a repeat echo Doppler study this week.  If his EF remains low, he will need referral for phylactic ICD implantation.  Past he has had some issues with possible lower extremity calf discomfort and had seen Dr. Sherren Mocha early and had ABI of 1.06 on the right and 0.85 on the left.  He is being followed closely in the advanced heart failure clinic.  I will review his echo when compared pleaded.  Repeat laboratory will be obtained in 3 months and I will see him in 4 months for reevaluation peer   Medication Adjustments/Labs and Tests Ordered: Current medicines are reviewed at length with the patient today.  Concerns regarding medicines are outlined above.  Medication changes, Labs and Tests ordered today are listed in the Patient Instructions below. Patient Instructions  Medication Instructions:  START Zetia 10 mg daily Decrease pravastatin to 20 mg daily  Labwork: Please return for FASTING labs in 3 months (CMET,Lipid)  Our in office lab hours are Monday-Friday 8:00-4:00, closed for lunch 12:45-1:45 pm.  No appointment needed.  Follow-Up: 4 months with Dr. Claiborne Billings  Any Other Special Instructions Will Be Listed Below  (If Applicable).     If you need a refill on your cardiac medications before your next appointment, please call your pharmacy.      Signed, Calvin Majestic, MD  07/19/2018 7:11 PM    [facility] [address], [place], [place], [place]  [address] Phone: [phone]

## 2018-07-13 ENCOUNTER — Ambulatory Visit (HOSPITAL_COMMUNITY)
Admission: RE | Admit: 2018-07-13 | Discharge: 2018-07-13 | Disposition: A | Discharge status: Home / Self Care | Payer: Medicare HMO | Source: Ambulatory Visit | Attending: Family Medicine | Admitting: Family Medicine

## 2018-07-13 DIAGNOSIS — I252 Old myocardial infarction: Secondary | ICD-10-CM | POA: Diagnosis not present

## 2018-07-13 DIAGNOSIS — E785 Hyperlipidemia, unspecified: Secondary | ICD-10-CM | POA: Diagnosis not present

## 2018-07-13 DIAGNOSIS — Z951 Presence of aortocoronary bypass graft: Secondary | ICD-10-CM | POA: Diagnosis not present

## 2018-07-13 DIAGNOSIS — I2510 Atherosclerotic heart disease of native coronary artery without angina pectoris: Secondary | ICD-10-CM | POA: Insufficient documentation

## 2018-07-13 DIAGNOSIS — I5022 Chronic systolic (congestive) heart failure: Secondary | ICD-10-CM

## 2018-07-13 DIAGNOSIS — Z720 Tobacco use: Secondary | ICD-10-CM | POA: Insufficient documentation

## 2018-07-13 DIAGNOSIS — I340 Nonrheumatic mitral (valve) insufficiency: Secondary | ICD-10-CM | POA: Diagnosis not present

## 2018-07-13 DIAGNOSIS — I110 Hypertensive heart disease with heart failure: Secondary | ICD-10-CM | POA: Insufficient documentation

## 2018-07-13 MED ORDER — PERFLUTREN LIPID MICROSPHERE
1.0000 mL | INTRAVENOUS | Status: AC | PRN
  Administered 2018-07-13: 2 mL via INTRAVENOUS
  Filled 2018-07-13: qty 10

## 2018-07-13 NOTE — Progress Notes (Signed)
  Echocardiogram 2D Echocardiogram has been performed.  Roosvelt MaserLane, Chanceler Pullin F 07/13/2018, 11:48 AM

## 2018-07-16 ENCOUNTER — Telehealth (HOSPITAL_COMMUNITY): Payer: Self-pay

## 2018-07-16 NOTE — Telephone Encounter (Signed)
Called to follow up with patient in regards to Cardiac Rehab- patient stated he has not received results from ECHO that took place on 07/13/18. Will follow up in a week.

## 2018-07-19 ENCOUNTER — Encounter: Payer: Self-pay | Admitting: Cardiovascular Disease

## 2018-07-22 ENCOUNTER — Telehealth (HOSPITAL_COMMUNITY): Payer: Self-pay

## 2018-07-23 ENCOUNTER — Telehealth (HOSPITAL_COMMUNITY): Payer: Self-pay

## 2018-07-23 NOTE — Telephone Encounter (Signed)
Attempted to follow up with patient in regards to Cardiac Rehab - lm on vm °

## 2018-07-25 NOTE — Progress Notes (Signed)
Advanced Heart Failure Clinic Note   Referring Physician: Kathryn Lawrence, NP PCP: Gutierrez, Javier, MD PCP-Cardiologist: Tom Kelly, MD (Previously Dr. Harding)  HPI: Calvin Francis is a 57 y.o. male with a history of CAD s/p 4v CABG in 2005, systolic HF due to ICM, HTN, obesity, Barrett's esophagus, and hyperlipidemia.   Has h/o CAD with CABG in 2005. Has done well since that time until recently   Admitted 3/5-3/10/19 with CP. LHC as below. 2/4 CABG grafts occluded. No good revascularization options, so medical therapy was advised. His EF on cath was 10-20% with CO 4.8, CI 2.06. Echo 3/6 showed EF 20-25% with diffuse HK, mild RV dysfunction, mild MR, mild biatrial enlargement. He diuresed 2 lbs with IV lasix. HF meds were optimized, but limited with orthostatic hypotension and AKI (creatinine peak 1.3). He was discharged with a lifevest. DC weight 238 lbs.   Previously stopped zetia and celexa due to leg cramps and dizziness.  Echo 07/13/18 EF 20-25% RV mildly HK  Today he returns for HF follow up. Says he feels terrible. Very fatigued. Still with cramps in legs with walking. Saw [facility] and ABIs not too bad. Says he can do ADLs without too much problem. However if he goes to the store has to stop about 200 feet due to leg cramps. Says it is in both legs but R > L. Says nights are worse for him due to CP and SOB. BP has been running high at night. Having a lot of anxiety. Denies edema. Weight up about 1 pound. Quit smoking 12/18. None since  CPX 04/14/18  Pre-Exercise PFTs FVC 4.35 (79%)    FEV1 3.09 (73%)     FEV1/FVC 71 (92%)     MVV 92 (57%)  RPE: 18 Reason stopped: patient ended test due to leg fatigue and pain (7/10)  Resting HR: 70 Peak HR: 103  (62% age predicted max HR) BP rest: 116/74 BP peak: 130/76 Peak VO2: 11.1 (43% predicted peak VO2) VE/VCO2 slope: 32 OUES: 1.48 Peak RER: 1.14 Ventilatory Threshold: 8.1 (32% predicted and 73% measured peak VO2) VE/MVV:  47% O2pulse: 11  (65% predicted O2pulse)   R/LHC 03/05/18:  2nd Mrg lesion is 65% stenosed.  Ischemic cardiomyopathy, with acute on chronic systolic heart failure.  Angiographic estimation of EF is 10-20%.  Severely elevated LVEDP and moderately elevated pulmonary capillary wedge pressure.  Severe occlusive native vessel disease with occlusion of the proximal to mid LAD, total occlusion of the proximal to mid circumflex, and total occlusion of the mid RCA.  Bypass graft occlusive disease with total occlusion of the SVG to RCA and SVG to diagonal.  Patent LIMA to the LAD with severe diffuse disease in the LAD distal to the graft insertion site.  Patent free radial to the obtuse marginal with 60-70% distal anastomosis stenosis. Fick Cardiac Output 4.81 L/min  Fick Cardiac Output Index 2.06 (L/min)/BSA  RA A Wave 13 mmHg  RA V Wave 14 mmHg  RA Mean 10 mmHg  RV Systolic Pressure 43 mmHg  RV Diastolic Pressure 4 mmHg  RV EDP 12 mmHg  PA Systolic Pressure 45 mmHg  PA Diastolic Pressure 21 mmHg  PA Mean 31 mmHg  PW A Wave 22 mmHg  PW V Wave 34 mmHg  PW Mean 22 mmHg  AO Systolic Pressure 100 mmHg  AO Diastolic Pressure 67 mmHg  AO Mean 81 mmHg  LV Systolic Pressure 96 mmHg  LV Diastolic Pressure 13 mmHg  LV EDP 26 mmHg      Echo 03/03/18: Left ventricle: The cavity size was severely dilated. Wall   thickness was increased in a pattern of mild LVH. Systolic   function was severely reduced. The estimated ejection fraction   was in the range of 20% to 25%. Diffuse hypokinesis. Doppler   parameters are consistent with restrictive physiology, indicative   of decreased left ventricular diastolic compliance and/or   increased left atrial pressure. Doppler parameters are consistent   with high ventricular filling pressure. - Mitral valve: There was mild regurgitation. - Left atrium: The atrium was mildly dilated. - Right ventricle: The cavity size was mildly dilated. Systolic    function was mildly reduced. - Right atrium: The atrium was mildly dilated.  Echo 2013: EF 40%   Past Medical History:  Diagnosis Date  . 3-vessel CAD 02/22/2004   mid RCA 100% occluded, L-R collaterals; LAD 60-70% bifurcation lesion; Cx proximal 70% and mid 80% after OM1, OM1 100% occluded. --> CABG x 4  . Barrett's esophagus    s/p EGD several years ago with [facility]  . Bronchitis, mucopurulent recurrent (HCC)   . CAD (coronary artery disease) of bypass graft 5/'12; 12/'13   Cath for angina and Abn Myoview ST: SVG-OM1 now occluded, attempt at PCI to the native circumflex OM1 unsuccessful; distal LAD beyond patent LIMA 70-80% (not PCI amenable); free radial-OM 2 patent; EF 45-50%  . Erectile dysfunction   . Former heavy cigarette smoker (20-39 per day) May 2013   . GERD (gastroesophageal reflux disease)    ?barrett's, with esophageal dysmotility, on omeprazole  . Glucose intolerance (impaired glucose tolerance)  2009   Prediabetes  . Gout   . History of Non-STEMI (non-ST elevated myocardial infarction) 02/22/2004   Three-vessel disease --> referred for CABG  . History of Non-STEMI (non-ST elevated myocardial infarction) December 2011   Hazy lesion in SVG-OM1 --> staged PCI with 4.0 mm x 20 mm vision BMS (4.5 mm)  . HLD (hyperlipidemia)    Statin intolerant  . Hypertension, benign   . Ischemic cardiomyopathy Echo 10/2012   EF ~40%; moderate Posterior HypoKinesis, minld-moderate inferior Hypokinesis; mild RV dilation, mild concentric LVH  . 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 Stable angina (HCC) 11/2012   Chronic,some what stable but still present; cardiac cath results above, no significant change from 2012  . Testosterone deficiency    On replacement; goal is low normal.  . Varicose veins December 2013   with venous reflux status VNUS ablation of left greater saphenous wein    Current Outpatient Medications  Medication Sig Dispense  Refill  . ALPRAZolam (XANAX) 0.25 MG tablet Take 1 tablet (0.25 mg total) by mouth 2 (two) times daily as needed for anxiety. 60 tablet 3  . APPLE CIDER VINEGAR PO Take 480 mg by mouth every morning.    . aspirin 81 MG chewable tablet Chew 81 mg by mouth daily.    . carvedilol (COREG) 3.125 MG tablet Take 1 tablet (3.125 mg total) by mouth 2 (two) times daily with a meal. 180 tablet 6  . co-enzyme Q-10 30 MG capsule Take 30 mg by mouth 2 (two) times daily.    . COLCRYS 0.6 MG tablet TAKE AS DIRECTED 2 TABLETS BY MOUTH ON FIRST DAY THEN ONE DAILY AS NEEDED 30 tablet 3  . ezetimibe (ZETIA) 10 MG tablet Take 1 tablet (10 mg total) by mouth daily. 30 tablet 3  . fluorouracil (EFUDEX) 5 % cream   Apply topically 2 (two) times daily. 40 g 1  . fluticasone (FLONASE) 50 MCG/ACT nasal spray Place 2 sprays into both nostrils daily as needed for allergies or rhinitis.    . furosemide (LASIX) 20 MG tablet Take 1 tablet (20 mg total) by mouth daily. 90 tablet 3  . nitroGLYCERIN (NITROSTAT) 0.4 MG SL tablet Place 1 tablet (0.4 mg total) under the tongue every 5 (five) minutes as needed for chest pain. 25 tablet 0  . omeprazole (PRILOSEC OTC) 20 MG tablet Take 40 mg by mouth daily.     . prasugrel (EFFIENT) 10 MG TABS tablet Take 1 tablet (10 mg total) by mouth daily. 90 tablet 3  . pravastatin (PRAVACHOL) 40 MG tablet Take 20 mg by mouth every other day.    . ranolazine (RANEXA) 1000 MG SR tablet Take 1 tablet (1,000 mg total) by mouth twice daily, as directed. 180 tablet 3  . sacubitril-valsartan (ENTRESTO) 24-26 MG Take 1 tablet by mouth 2 (two) times daily. 60 tablet 6  . spironolactone (ALDACTONE) 25 MG tablet Take 0.5 tablets (12.5 mg total) by mouth daily. 45 tablet 3  . vitamin B-12 (CYANOCOBALAMIN) 500 MCG tablet Take 500 mcg by mouth daily.     No current facility-administered medications for this encounter.     Allergies  Allergen Reactions  . Statins   . Zetia [Ezetimibe]   . Clopidogrel Other  (See Comments)    Disoriented   . Codeine Rash      Social History   Socioeconomic History  . Marital status: Married    Spouse name: Not on file  . Number of children: Not on file  . Years of education: Not on file  . Highest education level: Not on file  Occupational History  . Not on file  Social Needs  . Financial resource strain: Not on file  . Food insecurity:    Worry: Not on file    Inability: Not on file  . Transportation needs:    Medical: Not on file    Non-medical: Not on file  Tobacco Use  . Smoking status: Former Smoker    Packs/day: 0.50    Years: 20.00    Pack years: 10.00    Types: Cigarettes    Last attempt to quit: 04/28/2012    Years since quitting: 6.2  . Smokeless tobacco: Never Used  . Tobacco comment: long time smoker, smokes occasional -anxiety  Substance and Sexual Activity  . Alcohol use: Yes    Alcohol/week: 0.0 oz    Comment: occasionally  . Drug use: No  . Sexual activity: Yes  Lifestyle  . Physical activity:    Days per week: Not on file    Minutes per session: Not on file  . Stress: Not on file  Relationships  . Social connections:    Talks on phone: Not on file    Gets together: Not on file    Attends religious service: Not on file    Active member of club or organization: Not on file    Attends meetings of clubs or organizations: Not on file    Relationship status: Not on file  . Intimate partner violence:    Fear of current or ex partner: Not on file    Emotionally abused: Not on file    Physically abused: Not on file    Forced sexual activity: Not on file  Other Topics Concern  . Not on file  Social History Narrative   Caffeine: 1   cup coffee in am   Lives with wife, 2 dogs   Married, 2 Grown children, 3 grandchildren   Occupation: disability from cardiac status for last year, prior worked for [employer]   Activity: walks driveway, limited by chest pain/SOB    Diet: good water, fruits/vegetables daily       Family History  Problem Relation Age of Onset  . Diabetes Paternal Aunt   . Coronary artery disease Paternal Uncle   . Stroke Maternal Uncle   . Cancer Neg Hx     Vitals:   07/26/18 0952  BP: 111/73  Pulse: 80  SpO2: 99%  Weight: 251 lb 12.8 oz (114.2 kg)  Height: 6' 2" (1.88 m)   Filed Weights   07/26/18 0952  Weight: 251 lb 12.8 oz (114.2 kg)   This SmartLink has not been configured with any valid records.   Wt Readings from Last 3 Encounters:  07/26/18 251 lb 12.8 oz (114.2 kg)  07/12/18 250 lb 9.6 oz (113.7 kg)  06/01/18 248 lb 6.4 oz (112.7 kg)    PHYSICAL EXAM: General:  Walked into clinic. No resp difficulty HEENT: normal Neck: supple. JVP 7-8. Carotids 2+ bilat; no bruits. No lymphadenopathy or thryomegaly appreciated. Cor: PMI laterally displaced. Regular rate & rhythm. No rubs, gallops or murmurs. Lungs: clear Abdomen: soft, nontender, nondistended. No hepatosplenomegaly. No bruits or masses. Good bowel sounds. Extremities: no cyanosis, clubbing, rash, edema  Severe varicose veins  Cant feel DP pulses. PT trace on R 2+ on L Neuro: alert & orientedx3, cranial nerves grossly intact. moves all 4 extremities w/o difficulty. Affect pleasant    ASSESSMENT & PLAN:  1. Systolic HF due to ICM. Echo 2013: EF 40%, Echo 02/2018: EF 20-25% with diffuse HK, mild RV dysfunction, mild MR, mild biatrial enlargement. EF on cath 10-15% - Echo 07/13/18 EF 20-25% RV mildly HK Personally reviewed - Symptomatically a bit worse NYHA III-IIIB - Volume status mildly elevated ReDS reading = 38% - CPX test reviewed in detail with him and his wife. pVO2 markedly depressed at 11.1 but slope ok at 32.  - Continue lasix 20 mg daily discussed need to take extra when weight is up. Will increase Entresto which should helop - Continue coreg 3.125 mg BID - Increase entresto 49/51 mg BID - - Continue spiro 12.5 mg daily. Check labs. - EF remains < 35%. Refer for ICD  -Long talk about probable  need for advanced therapies in the near future. CPX qualifies by pVO2 but slope ok. Will plan RHC to further assess hemodynamics. Have discussed with [facility] coordinators and will arrange visit to discuss options and work-up process in detail. He will also meet with current [facility] patient.   2. CAD s/p 4v CABG 2005, LHC 02/2018: 2nd Mrg lesion is 65% stenosed, Severe occlusive native vessel disease with occlusion of the proximal to mid LAD, total occlusion of the proximal to mid circumflex, and total occlusion of the mid RCA. Bypass graft occlusive disease with total occlusion of the SVG to RCA and SVG to diagonal. Patent LIMA to the LAD with severe diffuse disease in the LAD distal to the graft insertion site. Patent free radial to the obtuse marginal with 60-70% distal anastomosis stenosis. - No s/s ischemia  - Continue ASA - Tolerating low-dose pravastatin 20.  Zetia recently restarted. Has consdiered [facility] but says insurance won't pay for it. - Per  Dr. Kelly.   3. HTN - Stable  4. HL - Followed by Dr. Kelly.  -   On pravastatin. Zetia recently restarted.   5. Snoring - Per Dr. Kelly.  6. Claudication-like symptoms - has been seen by [facility] (Dr. Early) - ABI of 1.06 on the right and 0.85 on the left. - On exam pulses are diminished will refer to [facility] team for second opinion and possible angio  7. Anxiety - Off celexa due to dizziness. I suspect that this is contributing to his symptoms as well.   Total time spent 45 minutes. Over half that time spent discussing above.    Daniel Bensimhon, MD 07/26/18     

## 2018-07-25 NOTE — H&P (View-Only) (Signed)
Advanced Heart Failure Clinic Note   Referring Physician: Joni Reining, NP PCP: Eustaquio Boyden, MD PCP-Cardiologist: Daphene Jaeger, MD (Previously Dr. Herbie Baltimore)  HPI: Calvin Francis is a 57 y.o. male with a history of CAD s/p 4v CABG in 2005, systolic HF due to ICM, HTN, obesity, Barrett's esophagus, and hyperlipidemia.   Has h/o CAD with CABG in 2005. Has done well since that time until recently   Admitted 3/5-3/10/19 with CP. LHC as below. 2/4 CABG grafts occluded. No good revascularization options, so medical therapy was advised. His EF on cath was 10-20% with CO 4.8, CI 2.06. Echo 3/6 showed EF 20-25% with diffuse HK, mild RV dysfunction, mild MR, mild biatrial enlargement. He diuresed 2 lbs with IV lasix. HF meds were optimized, but limited with orthostatic hypotension and AKI (creatinine peak 1.3). He was discharged with a lifevest. DC weight 238 lbs.   Previously stopped zetia and celexa due to leg cramps and dizziness.  Echo 07/13/18 EF 20-25% RV mildly HK  Today he returns for HF follow up. Says he feels terrible. Very fatigued. Still with cramps in legs with walking. Saw VVS and ABIs not too bad. Says he can do ADLs without too much problem. However if he goes to the store has to stop about 200 feet due to leg cramps. Says it is in both legs but R > L. Says nights are worse for him due to CP and SOB. BP has been running high at night. Having a lot of anxiety. Denies edema. Weight up about 1 pound. Quit smoking 12/18. None since  CPX 04/14/18  Pre-Exercise PFTs FVC 4.35 (79%)    FEV1 3.09 (73%)     FEV1/FVC 71 (92%)     MVV 92 (57%)  RPE: 18 Reason stopped: patient ended test due to leg fatigue and pain (7/10)  Resting HR: 70 Peak HR: 103  (62% age predicted max HR) BP rest: 116/74 BP peak: 130/76 Peak VO2: 11.1 (43% predicted peak VO2) VE/VCO2 slope: 32 OUES: 1.48 Peak RER: 1.14 Ventilatory Threshold: 8.1 (32% predicted and 73% measured peak VO2) VE/MVV:  47% O2pulse: 11  (65% predicted O2pulse)   R/LHC 03/05/18:  2nd Mrg lesion is 65% stenosed.  Ischemic cardiomyopathy, with acute on chronic systolic heart failure.  Angiographic estimation of EF is 10-20%.  Severely elevated LVEDP and moderately elevated pulmonary capillary wedge pressure.  Severe occlusive native vessel disease with occlusion of the proximal to mid LAD, total occlusion of the proximal to mid circumflex, and total occlusion of the mid RCA.  Bypass graft occlusive disease with total occlusion of the SVG to RCA and SVG to diagonal.  Patent LIMA to the LAD with severe diffuse disease in the LAD distal to the graft insertion site.  Patent free radial to the obtuse marginal with 60-70% distal anastomosis stenosis. Fick Cardiac Output 4.81 L/min  Fick Cardiac Output Index 2.06 (L/min)/BSA  RA A Wave 13 mmHg  RA V Wave 14 mmHg  RA Mean 10 mmHg  RV Systolic Pressure 43 mmHg  RV Diastolic Pressure 4 mmHg  RV EDP 12 mmHg  PA Systolic Pressure 45 mmHg  PA Diastolic Pressure 21 mmHg  PA Mean 31 mmHg  PW A Wave 22 mmHg  PW V Wave 34 mmHg  PW Mean 22 mmHg  AO Systolic Pressure 100 mmHg  AO Diastolic Pressure 67 mmHg  AO Mean 81 mmHg  LV Systolic Pressure 96 mmHg  LV Diastolic Pressure 13 mmHg  LV EDP 26 mmHg  Echo 03/03/18: Left ventricle: The cavity size was severely dilated. Wall   thickness was increased in a pattern of mild LVH. Systolic   function was severely reduced. The estimated ejection fraction   was in the range of 20% to 25%. Diffuse hypokinesis. Doppler   parameters are consistent with restrictive physiology, indicative   of decreased left ventricular diastolic compliance and/or   increased left atrial pressure. Doppler parameters are consistent   with high ventricular filling pressure. - Mitral valve: There was mild regurgitation. - Left atrium: The atrium was mildly dilated. - Right ventricle: The cavity size was mildly dilated. Systolic    function was mildly reduced. - Right atrium: The atrium was mildly dilated.  Echo 2013: EF 40%   Past Medical History:  Diagnosis Date  . 3-vessel CAD 02/22/2004   mid RCA 100% occluded, L-R collaterals; LAD 60-70% bifurcation lesion; Cx proximal 70% and mid 80% after OM1, OM1 100% occluded. --> CABG x 4  . Barrett's esophagus    s/p EGD several years ago with [place]  . Bronchitis, mucopurulent recurrent (HCC)   . CAD (coronary artery disease) of bypass graft 5/'12; 12/'13   Cath for angina and Abn Myoview ST: SVG-OM1 now occluded, attempt at PCI to the native circumflex OM1 unsuccessful; distal LAD beyond patent LIMA 70-80% (not PCI amenable); free radial-OM 2 patent; EF 45-50%  . Erectile dysfunction   . Former heavy cigarette smoker (20-39 per day) May 2013   . GERD (gastroesophageal reflux disease)    ?barrett's, with esophageal dysmotility, on omeprazole  . Glucose intolerance (impaired glucose tolerance)  2009   Prediabetes  . Gout   . History of Non-STEMI (non-ST elevated myocardial infarction) 02/22/2004   Three-vessel disease --> referred for CABG  . History of Non-STEMI (non-ST elevated myocardial infarction) December 2011   Hazy lesion in SVG-OM1 --> staged PCI with 4.0 mm x 20 mm vision BMS (4.5 mm)  . HLD (hyperlipidemia)    Statin intolerant  . Hypertension, benign   . Ischemic cardiomyopathy Echo 10/2012   EF ~40%; moderate Posterior HypoKinesis, minld-moderate inferior Hypokinesis; mild RV dilation, mild concentric LVH  . 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 Stable angina (HCC) 11/2012   Chronic,some what stable but still present; cardiac cath results above, no significant change from 2012  . Testosterone deficiency    On replacement; goal is low normal.  . Varicose veins December 2013   with venous reflux status VNUS ablation of left greater saphenous wein    Current Outpatient Medications  Medication Sig Dispense  Refill  . ALPRAZolam (XANAX) 0.25 MG tablet Take 1 tablet (0.25 mg total) by mouth 2 (two) times daily as needed for anxiety. 60 tablet 3  . APPLE CIDER VINEGAR PO Take 480 mg by mouth every morning.    Marland Kitchen aspirin 81 MG chewable tablet Chew 81 mg by mouth daily.    . carvedilol (COREG) 3.125 MG tablet Take 1 tablet (3.125 mg total) by mouth 2 (two) times daily with a meal. 180 tablet 6  . co-enzyme Q-10 30 MG capsule Take 30 mg by mouth 2 (two) times daily.    Marland Kitchen COLCRYS 0.6 MG tablet TAKE AS DIRECTED 2 TABLETS BY MOUTH ON FIRST DAY THEN ONE DAILY AS NEEDED 30 tablet 3  . ezetimibe (ZETIA) 10 MG tablet Take 1 tablet (10 mg total) by mouth daily. 30 tablet 3  . fluorouracil (EFUDEX) 5 % cream  Apply topically 2 (two) times daily. 40 g 1  . fluticasone (FLONASE) 50 MCG/ACT nasal spray Place 2 sprays into both nostrils daily as needed for allergies or rhinitis.    . furosemide (LASIX) 20 MG tablet Take 1 tablet (20 mg total) by mouth daily. 90 tablet 3  . nitroGLYCERIN (NITROSTAT) 0.4 MG SL tablet Place 1 tablet (0.4 mg total) under the tongue every 5 (five) minutes as needed for chest pain. 25 tablet 0  . omeprazole (PRILOSEC OTC) 20 MG tablet Take 40 mg by mouth daily.     . prasugrel (EFFIENT) 10 MG TABS tablet Take 1 tablet (10 mg total) by mouth daily. 90 tablet 3  . pravastatin (PRAVACHOL) 40 MG tablet Take 20 mg by mouth every other day.    . ranolazine (RANEXA) 1000 MG SR tablet Take 1 tablet (1,000 mg total) by mouth twice daily, as directed. 180 tablet 3  . sacubitril-valsartan (ENTRESTO) 24-26 MG Take 1 tablet by mouth 2 (two) times daily. 60 tablet 6  . spironolactone (ALDACTONE) 25 MG tablet Take 0.5 tablets (12.5 mg total) by mouth daily. 45 tablet 3  . vitamin B-12 (CYANOCOBALAMIN) 500 MCG tablet Take 500 mcg by mouth daily.     No current facility-administered medications for this encounter.     Allergies  Allergen Reactions  . Statins   . Zetia [Ezetimibe]   . Clopidogrel Other  (See Comments)    Disoriented   . Codeine Rash      Social History   Socioeconomic History  . Marital status: Married    Spouse name: Not on file  . Number of children: Not on file  . Years of education: Not on file  . Highest education level: Not on file  Occupational History  . Not on file  Social Needs  . Financial resource strain: Not on file  . Food insecurity:    Worry: Not on file    Inability: Not on file  . Transportation needs:    Medical: Not on file    Non-medical: Not on file  Tobacco Use  . Smoking status: Former Smoker    Packs/day: 0.50    Years: 20.00    Pack years: 10.00    Types: Cigarettes    Last attempt to quit: 04/28/2012    Years since quitting: 6.2  . Smokeless tobacco: Never Used  . Tobacco comment: long time smoker, smokes occasional -anxiety  Substance and Sexual Activity  . Alcohol use: Yes    Alcohol/week: 0.0 oz    Comment: occasionally  . Drug use: No  . Sexual activity: Yes  Lifestyle  . Physical activity:    Days per week: Not on file    Minutes per session: Not on file  . Stress: Not on file  Relationships  . Social connections:    Talks on phone: Not on file    Gets together: Not on file    Attends religious service: Not on file    Active member of club or organization: Not on file    Attends meetings of clubs or organizations: Not on file    Relationship status: Not on file  . Intimate partner violence:    Fear of current or ex partner: Not on file    Emotionally abused: Not on file    Physically abused: Not on file    Forced sexual activity: Not on file  Other Topics Concern  . Not on file  Social History Narrative   Caffeine: 1  cup coffee in am   Lives with wife, 2 dogs   Married, 2 Grown children, 3 grandchildren   Occupation: disability from cardiac status for last year, prior worked for [employer]   Activity: walks driveway, limited by chest pain/SOB    Diet: good water, fruits/vegetables daily       Family History  Problem Relation Age of Onset  . Diabetes Paternal Aunt   . Coronary artery disease Paternal Uncle   . Stroke Maternal Uncle   . Cancer Neg Hx     Vitals:   07/26/18 0952  BP: 111/73  Pulse: 80  SpO2: 99%  Weight: 251 lb 12.8 oz (114.2 kg)  Height: 6\' 2"  (1.88 m)   Filed Weights   07/26/18 0952  Weight: 251 lb 12.8 oz (114.2 kg)   This SmartLink has not been configured with any valid records.   Wt Readings from Last 3 Encounters:  07/26/18 251 lb 12.8 oz (114.2 kg)  07/12/18 250 lb 9.6 oz (113.7 kg)  06/01/18 248 lb 6.4 oz (112.7 kg)    PHYSICAL EXAM: General:  Walked into clinic. No resp difficulty HEENT: normal Neck: supple. JVP 7-8. Carotids 2+ bilat; no bruits. No lymphadenopathy or thryomegaly appreciated. Cor: PMI laterally displaced. Regular rate & rhythm. No rubs, gallops or murmurs. Lungs: clear Abdomen: soft, nontender, nondistended. No hepatosplenomegaly. No bruits or masses. Good bowel sounds. Extremities: no cyanosis, clubbing, rash, edema  Severe varicose veins  Cant feel DP pulses. PT trace on R 2+ on L Neuro: alert & orientedx3, cranial nerves grossly intact. moves all 4 extremities w/o difficulty. Affect pleasant    ASSESSMENT & PLAN:  1. Systolic HF due to ICM. Echo 2013: EF 40%, Echo 02/2018: EF 20-25% with diffuse HK, mild RV dysfunction, mild MR, mild biatrial enlargement. EF on cath 10-15% - Echo 07/13/18 EF 20-25% RV mildly HK Personally reviewed - Symptomatically a bit worse NYHA III-IIIB - Volume status mildly elevated ReDS reading = 38% - CPX test reviewed in detail with him and his wife. pVO2 markedly depressed at 11.1 but slope ok at 32.  - Continue lasix 20 mg daily discussed need to take extra when weight is up. Will increase Entresto which should helop - Continue coreg 3.125 mg BID - Increase entresto 49/51 mg BID - - Continue spiro 12.5 mg daily. Check labs. - EF remains < 35%. Refer for ICD  -Long talk about probable  need for advanced therapies in the near future. CPX qualifies by pVO2 but slope ok. Will plan RHC to further assess hemodynamics. Have discussed with VAD coordinators and will arrange visit to discuss options and work-up process in detail. He will also meet with current VAD patient.   2. CAD s/p 4v CABG 2005, LHC 02/2018: 2nd Mrg lesion is 65% stenosed, Severe occlusive native vessel disease with occlusion of the proximal to mid LAD, total occlusion of the proximal to mid circumflex, and total occlusion of the mid RCA. Bypass graft occlusive disease with total occlusion of the SVG to RCA and SVG to diagonal. Patent LIMA to the LAD with severe diffuse disease in the LAD distal to the graft insertion site. Patent free radial to the obtuse marginal with 60-70% distal anastomosis stenosis. - No s/s ischemia  - Continue ASA - Tolerating low-dose pravastatin 20.  Zetia recently restarted. Has consdiered [facility] but says insurance won't pay for it. - Per  Dr. Tresa Endo.   3. HTN - Stable  4. HL - Followed by Dr. Tresa Endo.  -  On pravastatin. Zetia recently restarted.   5. Snoring - Per Dr. Tresa EndoKelly.  6. Claudication-like symptoms - has been seen by VVS (Dr. Arbie CookeyEarly) - ABI of 1.06 on the right and 0.85 on the left. - On exam pulses are diminished [facility] second opinion and possible angio  7. Anxiety - Off celexa due to dizziness. I suspect that this is contributing to his symptoms as well.   Total time spent 45 minutes. Over half that time spent discussing above.    Arvilla Meresaniel Darleth Eustache, MD 07/26/18

## 2018-07-26 ENCOUNTER — Encounter (HOSPITAL_COMMUNITY): Payer: Self-pay | Admitting: Internal Medicine

## 2018-07-26 ENCOUNTER — Other Ambulatory Visit: Payer: Self-pay

## 2018-07-26 ENCOUNTER — Ambulatory Visit (HOSPITAL_COMMUNITY)
Admission: RE | Admit: 2018-07-26 | Discharge: 2018-07-26 | Disposition: A | Discharge status: Home / Self Care | Payer: Medicare HMO | Source: Ambulatory Visit | Attending: Internal Medicine | Admitting: Internal Medicine

## 2018-07-26 ENCOUNTER — Encounter (HOSPITAL_COMMUNITY): Payer: Self-pay | Admitting: *Deleted

## 2018-07-26 ENCOUNTER — Other Ambulatory Visit (HOSPITAL_COMMUNITY): Payer: Self-pay | Admitting: *Deleted

## 2018-07-26 VITALS — BP 111/73 | HR 80 | Ht 74.0 in | Wt 251.8 lb

## 2018-07-26 DIAGNOSIS — Z87891 Personal history of nicotine dependence: Secondary | ICD-10-CM | POA: Diagnosis not present

## 2018-07-26 DIAGNOSIS — I25708 Atherosclerosis of coronary artery bypass graft(s), unspecified, with other forms of angina pectoris: Secondary | ICD-10-CM | POA: Diagnosis not present

## 2018-07-26 DIAGNOSIS — Z885 Allergy status to narcotic agent status: Secondary | ICD-10-CM | POA: Insufficient documentation

## 2018-07-26 DIAGNOSIS — I25810 Atherosclerosis of coronary artery bypass graft(s) without angina pectoris: Secondary | ICD-10-CM | POA: Diagnosis not present

## 2018-07-26 DIAGNOSIS — F419 Anxiety disorder, unspecified: Secondary | ICD-10-CM | POA: Insufficient documentation

## 2018-07-26 DIAGNOSIS — I252 Old myocardial infarction: Secondary | ICD-10-CM | POA: Insufficient documentation

## 2018-07-26 DIAGNOSIS — E785 Hyperlipidemia, unspecified: Secondary | ICD-10-CM | POA: Diagnosis not present

## 2018-07-26 DIAGNOSIS — R0683 Snoring: Secondary | ICD-10-CM | POA: Diagnosis not present

## 2018-07-26 DIAGNOSIS — I70213 Atherosclerosis of native arteries of extremities with intermittent claudication, bilateral legs: Secondary | ICD-10-CM

## 2018-07-26 DIAGNOSIS — R7303 Prediabetes: Secondary | ICD-10-CM | POA: Insufficient documentation

## 2018-07-26 DIAGNOSIS — Z6832 Body mass index (BMI) 32.0-32.9, adult: Secondary | ICD-10-CM | POA: Insufficient documentation

## 2018-07-26 DIAGNOSIS — I5022 Chronic systolic (congestive) heart failure: Secondary | ICD-10-CM | POA: Diagnosis not present

## 2018-07-26 DIAGNOSIS — K219 Gastro-esophageal reflux disease without esophagitis: Secondary | ICD-10-CM | POA: Insufficient documentation

## 2018-07-26 DIAGNOSIS — Z888 Allergy status to other drugs, medicaments and biological substances status: Secondary | ICD-10-CM | POA: Diagnosis not present

## 2018-07-26 DIAGNOSIS — Z79899 Other long term (current) drug therapy: Secondary | ICD-10-CM | POA: Insufficient documentation

## 2018-07-26 DIAGNOSIS — Z7982 Long term (current) use of aspirin: Secondary | ICD-10-CM | POA: Insufficient documentation

## 2018-07-26 DIAGNOSIS — I255 Ischemic cardiomyopathy: Secondary | ICD-10-CM | POA: Insufficient documentation

## 2018-07-26 DIAGNOSIS — I110 Hypertensive heart disease with heart failure: Secondary | ICD-10-CM | POA: Diagnosis not present

## 2018-07-26 DIAGNOSIS — E669 Obesity, unspecified: Secondary | ICD-10-CM | POA: Diagnosis not present

## 2018-07-26 DIAGNOSIS — Z951 Presence of aortocoronary bypass graft: Secondary | ICD-10-CM | POA: Insufficient documentation

## 2018-07-26 DIAGNOSIS — M109 Gout, unspecified: Secondary | ICD-10-CM | POA: Insufficient documentation

## 2018-07-26 LAB — CBC
HEMATOCRIT: 41 % (ref 39.0–52.0)
Hemoglobin: 13.3 g/dL (ref 13.0–17.0)
MCH: 28.6 pg (ref 26.0–34.0)
MCHC: 32.4 g/dL (ref 30.0–36.0)
MCV: 88.2 fL (ref 78.0–100.0)
PLATELETS: 198 10*3/uL (ref 150–400)
RBC: 4.65 MIL/uL (ref 4.22–5.81)
RDW: 13.6 % (ref 11.5–15.5)
WBC: 6.8 10*3/uL (ref 4.0–10.5)

## 2018-07-26 LAB — COMPREHENSIVE METABOLIC PANEL
ALT: 26 U/L (ref 0–44)
AST: 29 U/L (ref 15–41)
Albumin: 4.1 g/dL (ref 3.5–5.0)
Alkaline Phosphatase: 62 U/L (ref 38–126)
Anion gap: 7 (ref 5–15)
BILIRUBIN TOTAL: 0.7 mg/dL (ref 0.3–1.2)
BUN: 9 mg/dL (ref 6–20)
CO2: 27 mmol/L (ref 22–32)
Calcium: 9 mg/dL (ref 8.9–10.3)
Chloride: 105 mmol/L (ref 98–111)
Creatinine, Ser: 1.12 mg/dL (ref 0.61–1.24)
GFR calc Af Amer: >60 mL/min (ref >60)
Glucose, Bld: 97 mg/dL (ref 70–99)
POTASSIUM: 4 mmol/L (ref 3.5–5.1)
Sodium: 139 mmol/L (ref 135–145)
TOTAL PROTEIN: 7.1 g/dL (ref 6.5–8.1)

## 2018-07-26 LAB — TYPE AND SCREEN
ABO/RH(D): O POS
ANTIBODY SCREEN: NEGATIVE

## 2018-07-26 LAB — BRAIN NATRIURETIC PEPTIDE: B NATRIURETIC PEPTIDE 5: 180.7 pg/mL — AB (ref 0.0–100.0)

## 2018-07-26 LAB — ABO/RH: ABO/RH(D): O POS

## 2018-07-26 MED ORDER — SACUBITRIL-VALSARTAN 49-51 MG PO TABS: 1.0000 | ORAL_TABLET | Freq: Two times a day (BID) | ORAL | 3 refills | Status: DC

## 2018-07-26 NOTE — Telephone Encounter (Signed)
error 

## 2018-07-26 NOTE — Patient Instructions (Signed)
Labs today (will call for abnormal results, otherwise no news is good news)  INCREASE Entresto to 49-51 mg (1 Tablet) Twice Daily.  You've been scheduled for a Right Heart Catheterization, please see attached instruction sheet.   Follow up in 1 month with our LVAD Coordinator.

## 2018-07-28 ENCOUNTER — Encounter (HOSPITAL_COMMUNITY)
Admission: RE | Disposition: A | Discharge status: Home / Self Care | Payer: Self-pay | Source: Ambulatory Visit | Attending: Internal Medicine

## 2018-07-28 ENCOUNTER — Encounter (HOSPITAL_COMMUNITY): Payer: Self-pay | Admitting: Internal Medicine

## 2018-07-28 ENCOUNTER — Ambulatory Visit (HOSPITAL_COMMUNITY)
Admission: RE | Admit: 2018-07-28 | Discharge: 2018-07-28 | Disposition: A | Discharge status: Home / Self Care | Payer: Medicare HMO | Source: Ambulatory Visit | Attending: Internal Medicine | Admitting: Internal Medicine

## 2018-07-28 ENCOUNTER — Other Ambulatory Visit: Payer: Self-pay

## 2018-07-28 DIAGNOSIS — E669 Obesity, unspecified: Secondary | ICD-10-CM | POA: Insufficient documentation

## 2018-07-28 DIAGNOSIS — K219 Gastro-esophageal reflux disease without esophagitis: Secondary | ICD-10-CM | POA: Insufficient documentation

## 2018-07-28 DIAGNOSIS — Z823 Family history of stroke: Secondary | ICD-10-CM | POA: Diagnosis not present

## 2018-07-28 DIAGNOSIS — Z9889 Other specified postprocedural states: Secondary | ICD-10-CM | POA: Insufficient documentation

## 2018-07-28 DIAGNOSIS — Z888 Allergy status to other drugs, medicaments and biological substances status: Secondary | ICD-10-CM | POA: Diagnosis not present

## 2018-07-28 DIAGNOSIS — Z951 Presence of aortocoronary bypass graft: Secondary | ICD-10-CM | POA: Diagnosis not present

## 2018-07-28 DIAGNOSIS — M109 Gout, unspecified: Secondary | ICD-10-CM | POA: Diagnosis not present

## 2018-07-28 DIAGNOSIS — Z8249 Family history of ischemic heart disease and other diseases of the circulatory system: Secondary | ICD-10-CM | POA: Insufficient documentation

## 2018-07-28 DIAGNOSIS — R7303 Prediabetes: Secondary | ICD-10-CM | POA: Diagnosis not present

## 2018-07-28 DIAGNOSIS — E785 Hyperlipidemia, unspecified: Secondary | ICD-10-CM | POA: Diagnosis not present

## 2018-07-28 DIAGNOSIS — I255 Ischemic cardiomyopathy: Secondary | ICD-10-CM | POA: Diagnosis not present

## 2018-07-28 DIAGNOSIS — I110 Hypertensive heart disease with heart failure: Secondary | ICD-10-CM | POA: Diagnosis not present

## 2018-07-28 DIAGNOSIS — Z7982 Long term (current) use of aspirin: Secondary | ICD-10-CM | POA: Diagnosis not present

## 2018-07-28 DIAGNOSIS — I5022 Chronic systolic (congestive) heart failure: Secondary | ICD-10-CM | POA: Diagnosis not present

## 2018-07-28 DIAGNOSIS — N529 Male erectile dysfunction, unspecified: Secondary | ICD-10-CM | POA: Diagnosis not present

## 2018-07-28 DIAGNOSIS — F419 Anxiety disorder, unspecified: Secondary | ICD-10-CM | POA: Insufficient documentation

## 2018-07-28 DIAGNOSIS — Z885 Allergy status to narcotic agent status: Secondary | ICD-10-CM | POA: Insufficient documentation

## 2018-07-28 DIAGNOSIS — K2270 Barrett's esophagus without dysplasia: Secondary | ICD-10-CM | POA: Diagnosis not present

## 2018-07-28 DIAGNOSIS — I252 Old myocardial infarction: Secondary | ICD-10-CM | POA: Diagnosis not present

## 2018-07-28 DIAGNOSIS — Z6832 Body mass index (BMI) 32.0-32.9, adult: Secondary | ICD-10-CM | POA: Diagnosis not present

## 2018-07-28 DIAGNOSIS — Z87891 Personal history of nicotine dependence: Secondary | ICD-10-CM | POA: Diagnosis not present

## 2018-07-28 HISTORY — PX: RIGHT HEART CATH: CATH118263

## 2018-07-28 LAB — POCT I-STAT 3, VENOUS BLOOD GAS (G3P V)
ACID-BASE DEFICIT: 1 mmol/L (ref 0.0–2.0)
BICARBONATE: 25.1 mmol/L (ref 20.0–28.0)
Bicarbonate: 24.1 mmol/L (ref 20.0–28.0)
Bicarbonate: 25.6 mmol/L (ref 20.0–28.0)
O2 SAT: 70 %
O2 SAT: 71 %
O2 Saturation: 66 %
PH VEN: 7.37 (ref 7.250–7.430)
PO2 VEN: 38 mmHg (ref 32.0–45.0)
TCO2: 26 mmol/L (ref 22–32)
TCO2: 25 mmol/L (ref 22–32)
TCO2: 27 mmol/L (ref 22–32)
pCO2, Ven: 42.1 mmHg — ABNORMAL LOW (ref 44.0–60.0)
pCO2, Ven: 43.4 mmHg — ABNORMAL LOW (ref 44.0–60.0)
pCO2, Ven: 43.5 mmHg — ABNORMAL LOW (ref 44.0–60.0)
pH, Ven: 7.365 (ref 7.250–7.430)
pH, Ven: 7.379 (ref 7.250–7.430)
pO2, Ven: 36 mmHg (ref 32.0–45.0)
pO2, Ven: 38 mmHg (ref 32.0–45.0)

## 2018-07-28 SURGERY — RIGHT HEART CATH
Anesthesia: LOCAL

## 2018-07-28 MED ORDER — SODIUM CHLORIDE 0.9% FLUSH: 3.0000 mL | INTRAVENOUS | Status: DC | PRN

## 2018-07-28 MED ORDER — SODIUM CHLORIDE 0.9% FLUSH: 3.0000 mL | Freq: Two times a day (BID) | INTRAVENOUS | Status: DC

## 2018-07-28 MED ORDER — FENTANYL CITRATE (PF) 100 MCG/2ML IJ SOLN
INTRAMUSCULAR | Status: DC | PRN
  Administered 2018-07-28: 25 ug via INTRAVENOUS

## 2018-07-28 MED ORDER — HEPARIN (PORCINE) IN NACL 1000-0.9 UT/500ML-% IV SOLN
INTRAVENOUS | Status: DC | PRN
  Administered 2018-07-28: 500 mL

## 2018-07-28 MED ORDER — ONDANSETRON HCL 4 MG/2ML IJ SOLN: 4.0000 mg | Freq: Four times a day (QID) | INTRAMUSCULAR | Status: DC | PRN

## 2018-07-28 MED ORDER — ACETAMINOPHEN 325 MG PO TABS: 650.0000 mg | ORAL_TABLET | ORAL | Status: DC | PRN

## 2018-07-28 MED ORDER — ASPIRIN 81 MG PO CHEW: 81.0000 mg | CHEWABLE_TABLET | ORAL | Status: DC

## 2018-07-28 MED ORDER — LIDOCAINE HCL (PF) 1 % IJ SOLN
INTRAMUSCULAR | Status: DC | PRN
  Administered 2018-07-28: 5 mL

## 2018-07-28 MED ORDER — HEPARIN (PORCINE) IN NACL 1000-0.9 UT/500ML-% IV SOLN
INTRAVENOUS | Status: AC
  Filled 2018-07-28: qty 500

## 2018-07-28 MED ORDER — MIDAZOLAM HCL 2 MG/2ML IJ SOLN
INTRAMUSCULAR | Status: AC
  Filled 2018-07-28: qty 2

## 2018-07-28 MED ORDER — SODIUM CHLORIDE 0.9 % IV SOLN: 250.0000 mL | INTRAVENOUS | Status: DC | PRN

## 2018-07-28 MED ORDER — FENTANYL CITRATE (PF) 100 MCG/2ML IJ SOLN
INTRAMUSCULAR | Status: AC
  Filled 2018-07-28: qty 2

## 2018-07-28 MED ORDER — SODIUM CHLORIDE 0.9 % IV SOLN
INTRAVENOUS | Status: DC
  Administered 2018-07-28: 08:00:00 via INTRAVENOUS

## 2018-07-28 MED ORDER — MIDAZOLAM HCL 2 MG/2ML IJ SOLN
INTRAMUSCULAR | Status: DC | PRN
  Administered 2018-07-28: 1 mg via INTRAVENOUS

## 2018-07-28 MED ORDER — LIDOCAINE HCL (PF) 1 % IJ SOLN
INTRAMUSCULAR | Status: AC
  Filled 2018-07-28: qty 30

## 2018-07-28 SURGICAL SUPPLY — 9 items
CATH BALLN WEDGE 5F 110CM (CATHETERS) ×1 IMPLANT
CATH VISTA GUIDE 7FR JL3 (CATHETERS) IMPLANT
KIT HEART LEFT (KITS) ×2 IMPLANT
PACK CARDIAC CATHETERIZATION (CUSTOM PROCEDURE TRAY) ×2 IMPLANT
PROTECTION STATION PRESSURIZED (MISCELLANEOUS) ×2
SHEATH GLIDE SLENDER 4/5FR (SHEATH) ×1 IMPLANT
STATION PROTECTION PRESSURIZED (MISCELLANEOUS) IMPLANT
TRANSDUCER W/STOPCOCK (MISCELLANEOUS) ×2 IMPLANT
TUBING CIL FLEX 10 FLL-RA (TUBING) ×2 IMPLANT

## 2018-07-28 NOTE — Interval H&P Note (Signed)
History and Physical Interval Note:  07/28/2018 8:54 AM  Calvin Francis W [place]  has presented today for surgery, with the diagnosis of hf  The various methods of treatment have been discussed with the patient and family. After consideration of risks, benefits and other options for treatment, the patient has consented to  Procedure(s): RIGHT HEART CATH (N/A) as a surgical intervention .  The patient's history has been reviewed, patient examined, no change in status, stable for surgery.  I have reviewed the patient's chart and labs.  Questions were answered to the patient's satisfaction.     Daniel Bensimhon

## 2018-07-28 NOTE — Discharge Instructions (Signed)
**Note Calvin Francis-Identified via Obfuscation** Brachial Site Care Refer to this sheet in the next few weeks. These instructions provide you with information about caring for yourself after your procedure. Your health care provider may also give you more specific instructions. Your treatment has been planned according to current medical practices, but problems sometimes occur. Call your health care provider if you have any problems or questions after your procedure. What can I expect after the procedure? After your procedure, it is typical to have the following:  Bruising at the brachial site that usually fades within 1-2 weeks.  Blood collecting in the tissue (hematoma) that may be painful to the touch. It should usually decrease in size and tenderness within 1-2 weeks.  Follow these instructions at home:  Take medicines only as directed by your health care provider.  You may shower 24 hours after the procedure or as directed by your health care provider. Remove the bandage (dressing) and gently wash the site with plain soap and water. Pat the area dry with a clean towel. Do not rub the site, because this may cause bleeding.  Do not take baths, swim, or use a hot tub until your health care provider approves.  Check your insertion site every day for redness, swelling, or drainage.  Do not apply powder or lotion to the site.  Do not flex or bend the affected arm for 24 hours or as directed by your health care provider.  Do not push or pull heavy objects with the affected arm for 24 hours or as directed by your health care provider.  Do not lift over 10 lb (4.5 kg) for 2 days after your procedure or as directed by your health care provider.  Ask your health care provider when it is okay to: ? Return to work or school. ? Resume usual physical activities or sports. ? Resume sexual activity.  Do not drive home if you are discharged the same day as the procedure. Have someone else drive you.  You may drive 24 hours after the procedure  unless otherwise instructed by your health care provider.  Do not operate machinery or power tools for 24 hours after the procedure.  If your procedure was done as an outpatient procedure, which means that you went home the same day as your procedure, a responsible adult should be with you for the first 24 hours after you arrive home.   Drink plenty of fluids  Keep all follow-up visits as directed by your health care provider. This is important. Contact a health care provider if:  You have a fever.  You have chills.  You have increased bleeding from the radial site. Hold pressure on the site. Get help right away if:  You have unusual pain at the brachial site.  You have redness, warmth, or swelling at the brachial site.  You have drainage (other than a small amount of blood on the dressing) from the brachial site.  The brachial site is bleeding, and the bleeding does not stop after 30 minutes of holding steady pressure on the site.  Your arm or hand becomes pale, cool, tingly, or numb. This information is not intended to replace advice given to you by your health care provider. Make sure you discuss any questions you have with your health care provider. Document Released: 01/17/2011 Document Revised: 05/22/2016 Document Reviewed: 07/03/2014 Elsevier Interactive Patient Education  2018 [employer]

## 2018-07-30 ENCOUNTER — Telehealth (HOSPITAL_COMMUNITY): Payer: Self-pay

## 2018-07-30 NOTE — Telephone Encounter (Signed)
Called to follow up with patient in regards to Cardiac Rehab - Patient stated he is not going to be able to participate at this time as he cannot afford the co-pay. Closed referral.

## 2018-08-26 ENCOUNTER — Ambulatory Visit (HOSPITAL_COMMUNITY)
Admission: RE | Admit: 2018-08-26 | Discharge: 2018-08-26 | Disposition: A | Discharge status: Home / Self Care | Payer: Medicare HMO | Source: Ambulatory Visit | Attending: Internal Medicine | Admitting: Internal Medicine

## 2018-08-26 DIAGNOSIS — F419 Anxiety disorder, unspecified: Secondary | ICD-10-CM | POA: Diagnosis not present

## 2018-08-26 DIAGNOSIS — I739 Peripheral vascular disease, unspecified: Secondary | ICD-10-CM

## 2018-08-26 DIAGNOSIS — Z885 Allergy status to narcotic agent status: Secondary | ICD-10-CM | POA: Insufficient documentation

## 2018-08-26 DIAGNOSIS — I252 Old myocardial infarction: Secondary | ICD-10-CM | POA: Diagnosis not present

## 2018-08-26 DIAGNOSIS — E669 Obesity, unspecified: Secondary | ICD-10-CM | POA: Insufficient documentation

## 2018-08-26 DIAGNOSIS — I110 Hypertensive heart disease with heart failure: Secondary | ICD-10-CM | POA: Diagnosis not present

## 2018-08-26 DIAGNOSIS — Z79899 Other long term (current) drug therapy: Secondary | ICD-10-CM | POA: Insufficient documentation

## 2018-08-26 DIAGNOSIS — Z951 Presence of aortocoronary bypass graft: Secondary | ICD-10-CM | POA: Diagnosis not present

## 2018-08-26 DIAGNOSIS — R0683 Snoring: Secondary | ICD-10-CM | POA: Diagnosis not present

## 2018-08-26 DIAGNOSIS — I5042 Chronic combined systolic (congestive) and diastolic (congestive) heart failure: Secondary | ICD-10-CM

## 2018-08-26 DIAGNOSIS — E785 Hyperlipidemia, unspecified: Secondary | ICD-10-CM | POA: Insufficient documentation

## 2018-08-26 DIAGNOSIS — R42 Dizziness and giddiness: Secondary | ICD-10-CM | POA: Insufficient documentation

## 2018-08-26 DIAGNOSIS — M109 Gout, unspecified: Secondary | ICD-10-CM | POA: Insufficient documentation

## 2018-08-26 DIAGNOSIS — Z888 Allergy status to other drugs, medicaments and biological substances status: Secondary | ICD-10-CM | POA: Insufficient documentation

## 2018-08-26 DIAGNOSIS — I255 Ischemic cardiomyopathy: Secondary | ICD-10-CM | POA: Diagnosis not present

## 2018-08-26 DIAGNOSIS — Z7982 Long term (current) use of aspirin: Secondary | ICD-10-CM | POA: Diagnosis not present

## 2018-08-26 DIAGNOSIS — I2510 Atherosclerotic heart disease of native coronary artery without angina pectoris: Secondary | ICD-10-CM | POA: Insufficient documentation

## 2018-08-26 DIAGNOSIS — R7303 Prediabetes: Secondary | ICD-10-CM | POA: Insufficient documentation

## 2018-08-26 DIAGNOSIS — I5020 Unspecified systolic (congestive) heart failure: Secondary | ICD-10-CM | POA: Diagnosis not present

## 2018-08-26 DIAGNOSIS — Z87891 Personal history of nicotine dependence: Secondary | ICD-10-CM | POA: Insufficient documentation

## 2018-08-26 DIAGNOSIS — K219 Gastro-esophageal reflux disease without esophagitis: Secondary | ICD-10-CM | POA: Insufficient documentation

## 2018-08-26 NOTE — Progress Notes (Signed)
Mechanical Circulatory Support Note   Pt presents to VAD clinic today for a VAD/transplant consult. Pt tells me that over the last month he has experienced angina, extreme claudication, orthopnea, pedal edema, nausea, SOB and fatigue. He denies syncope, stroke, PND, abdominal fullness and palpitations.   BP: 125/76 (88) HR: 79 Weight: 253lbs  Calvin Francis is a 57 y.o. with  has a past medical history of 3-vessel CAD (02/22/2004), Barrett's esophagus, Bronchitis, mucopurulent recurrent (HCC), CAD (coronary artery disease) of bypass graft (5/'12; 12/'13), Erectile dysfunction, Former heavy cigarette smoker (20-39 per day) (May 2013 ), GERD (gastroesophageal reflux disease), Glucose intolerance (impaired glucose tolerance) ( 2009), Gout, History of Non-STEMI (non-ST elevated myocardial infarction) (02/22/2004), History of Non-STEMI (non-ST elevated myocardial infarction) (December 2011), HLD (hyperlipidemia), Hypertension, benign, Ischemic cardiomyopathy (Echo 10/2012), S/P CABG x 4 (February 2005), Stable angina [facility](HCC) (11/2012), Testosterone deficiency, and Varicose veins (December 2013).. Currently being evaluated for advanced therapies which include Left Ventricular Assist Device implantation.   Have had lengthy discussion with patient about the risks, benefits, indications and alternatives to Heartmate III VAD placement. I have reviewed in full the following; caregiver role, survival, complications, life style changes, follow up care, discharge planning, rehabilitation, VAD dressing supplies and driveline management care. The patient was provided with educational resources to take home and read more about the device offered. The patient understands that from this discussion it does not mean that they will receive the device, but that depends on an extensive evaluation process. The patient is aware of the fact that if at anytime they want to stop the evaluation process they can.   I have provided the  patient with my contact information in order for them to call back if they have any questions.  [facility]. Referral sent to PREP program and Dr. Jari SportsmanArida's office.  At this time pt is refusing VAD. Pt and I had a lengthy discussion about this and pt is going to consider at least meeting a VAD pt.  Plan: 1. [facility]. 2. Refer pt for Exercise PREP program. 3. Repeat CPX late November/December. 4. Refer pt to Dr. Kirke CorinArida for possible angiogram for extreme claudication. 5. Return to clinic in 1 month w/Dr. Gala RomneyBensimhon.   Calvin AdamSarah Remonia Otte RN, BSN VAD Coordinator 24/7 Pager [phone]   Calvin Francis, Calvin Gebbia [name], RN

## 2018-08-26 NOTE — Progress Notes (Signed)
Advanced Heart Failure Clinic Note   Referring Physician: Joni Reining, NP PCP: Eustaquio Boyden, MD PCP-Cardiologist: Daphene Jaeger, MD (Previously Dr. Herbie Baltimore)  HPI: Calvin Francis is a 57 y.o. male with a history of CAD s/p 4v CABG in 2005, systolic HF due to ICM, HTN, obesity, Barrett's esophagus, and hyperlipidemia.   Has h/o CAD with CABG in 2005. Has done well since that time until recently   Admitted 3/5-3/10/19 with CP. LHC as below. 2/4 CABG grafts occluded. No good revascularization options, so medical therapy was advised. His EF on cath was 10-20% with CO 4.8, CI 2.06. Echo 3/6 showed EF 20-25% with diffuse HK, mild RV dysfunction, mild MR, mild biatrial enlargement. He diuresed 2 lbs with IV lasix. HF meds were optimized, but limited with orthostatic hypotension and AKI (creatinine peak 1.3). He was discharged with a lifevest. DC weight 238 lbs.   Previously stopped zetia and celexa due to leg cramps and dizziness.  Echo 07/13/18 EF 20-25% RV mildly HK  Today he returns for HF follow up. Says he continues to struggle with HF symptoms. Unable to walk more than 1 or 2 aisles at the grocery store before he has to stop. Main issue is leg cramping R>L but also gets fatigues and SOB. No CP, orthopnea or PND. Weight stable.  Recent RHC with normal hemodynamics. Has had multiple ABIs all of which have been normal. Saw Dr. Arbie Cookey in [facility] and told his legs were ok.   RHC 07/28/18 RA = 6 RV = 25/6 PA = 24/14 (18) PCW = 16 Fick cardiac output/index = 5.5/2.3 PVR = 0.4 WU Ao sat = 98% PA sat = 70%, 71% SVC sat = 66%  CPX 04/14/18  Pre-Exercise PFTs FVC 4.35 (79%)    FEV1 3.09 (73%)     FEV1/FVC 71 (92%)     MVV 92 (57%)  RPE: 18 Reason stopped: patient ended test due to leg fatigue and pain (7/10)  Resting HR: 70 Peak HR: 103  (62% age predicted max HR) BP rest: 116/74 BP peak: 130/76 Peak VO2: 11.1 (43% predicted peak VO2) VE/VCO2 slope: 32 OUES: 1.48 Peak RER:  1.14 Ventilatory Threshold: 8.1 (32% predicted and 73% measured peak VO2) VE/MVV: 47% O2pulse: 11  (65% predicted O2pulse)   R/LHC 03/05/18:  2nd Mrg lesion is 65% stenosed.  Ischemic cardiomyopathy, with acute on chronic systolic heart failure.  Angiographic estimation of EF is 10-20%.  Severely elevated LVEDP and moderately elevated pulmonary capillary wedge pressure.  Severe occlusive native vessel disease with occlusion of the proximal to mid LAD, total occlusion of the proximal to mid circumflex, and total occlusion of the mid RCA.  Bypass graft occlusive disease with total occlusion of the SVG to RCA and SVG to diagonal.  Patent LIMA to the LAD with severe diffuse disease in the LAD distal to the graft insertion site.  Patent free radial to the obtuse marginal with 60-70% distal anastomosis stenosis. Fick Cardiac Output 4.81 L/min  Fick Cardiac Output Index 2.06 (L/min)/BSA  RA A Wave 13 mmHg  RA V Wave 14 mmHg  RA Mean 10 mmHg  RV Systolic Pressure 43 mmHg  RV Diastolic Pressure 4 mmHg  RV EDP 12 mmHg  PA Systolic Pressure 45 mmHg  PA Diastolic Pressure 21 mmHg  PA Mean 31 mmHg  PW A Wave 22 mmHg  PW V Wave 34 mmHg  PW Mean 22 mmHg  AO Systolic Pressure 100 mmHg  AO Diastolic Pressure 67 mmHg  AO Mean  81 mmHg  LV Systolic Pressure 96 mmHg  LV Diastolic Pressure 13 mmHg  LV EDP 26 mmHg    Echo 03/03/18: Left ventricle: The cavity size was severely dilated. Wall   thickness was increased in a pattern of mild LVH. Systolic   function was severely reduced. The estimated ejection fraction   was in the range of 20% to 25%. Diffuse hypokinesis. Doppler   parameters are consistent with restrictive physiology, indicative   of decreased left ventricular diastolic compliance and/or   increased left atrial pressure. Doppler parameters are consistent   with high ventricular filling pressure. - Mitral valve: There was mild regurgitation. - Left atrium: The atrium was mildly  dilated. - Right ventricle: The cavity size was mildly dilated. Systolic   function was mildly reduced. - Right atrium: The atrium was mildly dilated.  Echo 2013: EF 40%   Past Medical History:  Diagnosis Date  . 3-vessel CAD 02/22/2004   mid RCA 100% occluded, L-R collaterals; LAD 60-70% bifurcation lesion; Cx proximal 70% and mid 80% after OM1, OM1 100% occluded. --> CABG x 4  . Barrett's esophagus    s/p EGD several years ago with [facility]  . Bronchitis, mucopurulent recurrent (HCC)   . CAD (coronary artery disease) of bypass graft 5/'12; 12/'13   Cath for angina and Abn Myoview ST: SVG-OM1 now occluded, attempt at PCI to the native circumflex OM1 unsuccessful; distal LAD beyond patent LIMA 70-80% (not PCI amenable); free radial-OM 2 patent; EF 45-50%  . Erectile dysfunction   . Former heavy cigarette smoker (20-39 per day) May 2013   . GERD (gastroesophageal reflux disease)    ?barrett's, with esophageal dysmotility, on omeprazole  . Glucose intolerance (impaired glucose tolerance)  2009   Prediabetes  . Gout   . History of Non-STEMI (non-ST elevated myocardial infarction) 02/22/2004   Three-vessel disease --> referred for CABG  . History of Non-STEMI (non-ST elevated myocardial infarction) December 2011   Hazy lesion in SVG-OM1 --> staged PCI with 4.0 mm x 20 mm vision BMS (4.5 mm)  . HLD (hyperlipidemia)    Statin intolerant  . Hypertension, benign   . Ischemic cardiomyopathy Echo 10/2012   EF ~40%; moderate Posterior HypoKinesis, minld-moderate inferior Hypokinesis; mild RV dilation, mild concentric LVH  . 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 Stable angina (HCC) 11/2012   Chronic,some what stable but still present; cardiac cath results above, no significant change from 2012  . Testosterone deficiency    On replacement; goal is low normal.  . Varicose veins December 2013   with venous reflux status VNUS ablation of left greater  saphenous wein    Current Outpatient Medications  Medication Sig Dispense Refill  . ALPRAZolam (XANAX) 0.25 MG tablet Take 1 tablet (0.25 mg total) by mouth 2 (two) times daily as needed for anxiety. 60 tablet 3  . APPLE CIDER VINEGAR PO Take 480 mg by mouth every morning.    Marland Kitchen. aspirin 81 MG chewable tablet Chew 81 mg by mouth daily.    . carvedilol (COREG) 3.125 MG tablet Take 1 tablet (3.125 mg total) by mouth 2 (two) times daily with a meal. 180 tablet 6  . co-enzyme Q-10 30 MG capsule Take 30 mg by mouth 2 (two) times daily.    Marland Kitchen. COLCRYS 0.6 MG tablet TAKE AS DIRECTED 2 TABLETS BY MOUTH ON FIRST DAY THEN ONE DAILY AS NEEDED 30 tablet 3  . ezetimibe (ZETIA)  10 MG tablet Take 1 tablet (10 mg total) by mouth daily. 30 tablet 3  . fluorouracil (EFUDEX) 5 % cream Apply topically 2 (two) times daily. 40 g 1  . fluticasone (FLONASE) 50 MCG/ACT nasal spray Place 2 sprays into both nostrils daily as needed for allergies or rhinitis.    . furosemide (LASIX) 20 MG tablet Take 1 tablet (20 mg total) by mouth daily. 90 tablet 3  . nitroGLYCERIN (NITROSTAT) 0.4 MG SL tablet Place 1 tablet (0.4 mg total) under the tongue every 5 (five) minutes as needed for chest pain. 25 tablet 0  . omeprazole (PRILOSEC OTC) 20 MG tablet Take 40 mg by mouth daily.     . prasugrel (EFFIENT) 10 MG TABS tablet Take 1 tablet (10 mg total) by mouth daily. 90 tablet 3  . pravastatin (PRAVACHOL) 40 MG tablet Take 20 mg by mouth every other day.    . ranolazine (RANEXA) 1000 MG SR tablet Take 1 tablet (1,000 mg total) by mouth twice daily, as directed. 180 tablet 3  . sacubitril-valsartan (ENTRESTO) 49-51 MG Take 1 tablet by mouth 2 (two) times daily. 60 tablet 3  . spironolactone (ALDACTONE) 25 MG tablet Take 0.5 tablets (12.5 mg total) by mouth daily. 45 tablet 3  . vitamin B-12 (CYANOCOBALAMIN) 500 MCG tablet Take 500 mcg by mouth daily.     No current facility-administered medications for this encounter.     Allergies    Allergen Reactions  . Statins   . Zetia [Ezetimibe]   . Clopidogrel Other (See Comments)    Disoriented   . Codeine Rash      Social History   Socioeconomic History  . Marital status: Married    Spouse name: Not on file  . Number of children: Not on file  . Years of education: Not on file  . Highest education level: Not on file  Occupational History  . Not on file  Social Needs  . Financial resource strain: Not on file  . Food insecurity:    Worry: Not on file    Inability: Not on file  . Transportation needs:    Medical: Not on file    Non-medical: Not on file  Tobacco Use  . Smoking status: Former Smoker    Packs/day: 0.50    Years: 20.00    Pack years: 10.00    Types: Cigarettes    Last attempt to quit: 04/28/2012    Years since quitting: 6.3  . Smokeless tobacco: Never Used  . Tobacco comment: long time smoker, smokes occasional -anxiety  Substance and Sexual Activity  . Alcohol use: Yes    Alcohol/week: 0.0 standard drinks    Comment: occasionally  . Drug use: No  . Sexual activity: Yes  Lifestyle  . Physical activity:    Days per week: Not on file    Minutes per session: Not on file  . Stress: Not on file  Relationships  . Social connections:    Talks on phone: Not on file    Gets together: Not on file    Attends religious service: Not on file    Active member of club or organization: Not on file    Attends meetings of clubs or organizations: Not on file    Relationship status: Not on file  . Intimate partner violence:    Fear of current or ex partner: Not on file    Emotionally abused: Not on file    Physically abused: Not on file  Forced sexual activity: Not on file  Other Topics Concern  . Not on file  Social History Narrative   Caffeine: 1 cup coffee in am   Lives with wife, 2 dogs   Married, 2 Grown children, 3 grandchildren   Occupation: disability from cardiac status for last year, prior worked for [employer]   Activity: walks  driveway, limited by chest pain/SOB    Diet: good water, fruits/vegetables daily      Family History  Problem Relation Age of Onset  . Diabetes Paternal Aunt   . Coronary artery disease Paternal Uncle   . Stroke Maternal Uncle   . Cancer Neg Hx      Wt Readings from Last 3 Encounters:  07/28/18 113.4 kg (250 lb)  07/26/18 114.2 kg (251 lb 12.8 oz)  07/12/18 113.7 kg (250 lb 9.6 oz)    PHYSICAL EXAM: General:  Well appearing. No resp difficulty HEENT: normal Neck: supple. no JVD. Carotids 2+ bilat; no bruits. No lymphadenopathy or thryomegaly appreciated. Cor: PMI nondisplaced. Regular rate & rhythm. No rubs, gallops or murmurs. Lungs: clear Abdomen: soft, nontender, nondistended. No hepatosplenomegaly. No bruits or masses. Good bowel sounds. Extremities: no cyanosis, clubbing, rash, edema Neuro: alert & orientedx3, cranial nerves grossly intact. moves all 4 extremities w/o difficulty. Affect pleasant   ASSESSMENT & PLAN:  1. Systolic HF due to ICM. Echo 2013: EF 40%, Echo 02/2018: EF 20-25% with diffuse HK, mild RV dysfunction, mild MR, mild biatrial enlargement. EF on cath 10-15% - Echo 07/13/18 EF 20-25% RV mildly HK Personally reviewed - Remains NYHA III. Mainly limited by leg fatigue/pain - CPX 4/19 - pVO2 markedly depressed at 11.1 but slope ok at 32.  - RHC 7/19 with normal hemodynamics - Volume status ok - Continue coreg 3.125 mg BID - Continue entresto  49/51 mg BID - - Continue spiro 12.5 mg daily. Check labs. - EF remains < 35%. Refer for ICD - Symptomatically NYHA III-IIIb. CpX with markedly reduced pVO2 but slope ok. RHC looks very good. Overall I think he will need advanced therapies but probably a bit early. Symptoms also concerning for claudication but multiple ABIs are ok and has been seen by [facility] and was told he was ok. Will ask Dr. Kirke Corin to see. Likely need angiogram to assess definitively prior to continuing with advanced therapies. I have encouraged him to  get an exercise bike and ride 20-30 mins a day with breaks as needed - Long talk about transplant and VAD. He is open to transplant but says he would never want to pursue VAD even if it was only option to save his life.  - Will send transplant packet to [facility]. Blood Type O-pos  2. CAD s/p 4v CABG 2005, LHC 02/2018: 2nd Mrg lesion is 65% stenosed, Severe occlusive native vessel disease with occlusion of the proximal to mid LAD, total occlusion of the proximal to mid circumflex, and total occlusion of the mid RCA. Bypass graft occlusive disease with total occlusion of the SVG to RCA and SVG to diagonal. Patent LIMA to the LAD with severe diffuse disease in the LAD distal to the graft insertion site. Patent free radial to the obtuse marginal with 60-70% distal anastomosis stenosis. - No s/s ischemia  - Continue ASA - Tolerating low-dose pravastatin 20.  Zetia recently restarted. Has consdiered [facility] but says insurance won't pay for it. - Per  Dr. Tresa Endo.   3. HTN - Stable  4. HL - Followed by Dr. Tresa Endo.  -  On pravastatin. Zetia recently restarted.   5. Snoring - Per Dr. Tresa Endo.  6. Claudication-like symptoms - has been seen by [facility] (Dr. Arbie Cookey) - Most recent ABI of 1.06 on the right and 0.85 on the left. - Will refer to Dr. Kirke Corin. Likely needs angiogram for definitive diagnosis as part of transplant w/u  7. Anxiety - Off celexa due to dizziness. I suspect that this is contributing to his symptoms as well.   Total time spent 60 minutes. Over half that time spent discussing above.   Arvilla Meres, MD 08/26/18

## 2018-08-27 ENCOUNTER — Encounter (HOSPITAL_COMMUNITY): Payer: Self-pay | Admitting: Unknown Physician Specialty

## 2018-08-27 ENCOUNTER — Telehealth: Payer: Self-pay

## 2018-08-27 NOTE — Progress Notes (Signed)
Heart Transplant packet sent to [facility].  Carlton AdamSarah Gaylynn Seiple RN, BSN V[facility] Coordinator 24/7 Pager [phone]

## 2018-08-27 NOTE — Telephone Encounter (Signed)
Spoke to Calvin Francis about the PREP and he is interested.  States he will be out of town the first few weeks into the next program start but I will call him back when I have a concrete start date set.

## 2018-09-01 ENCOUNTER — Telehealth: Payer: Self-pay

## 2018-09-01 NOTE — Telephone Encounter (Signed)
Left a VM for Calvin Francis to call back next week to make an intake appointment for the PREP at [facility]

## 2018-09-03 ENCOUNTER — Encounter: Payer: Self-pay | Admitting: Cardiovascular Disease

## 2018-09-03 ENCOUNTER — Other Ambulatory Visit: Payer: Self-pay | Admitting: Family Medicine

## 2018-09-03 ENCOUNTER — Ambulatory Visit: Payer: Medicare HMO | Admitting: Cardiovascular Disease

## 2018-09-03 VITALS — BP 112/74 | HR 80 | Ht 74.0 in | Wt 252.0 lb

## 2018-09-03 DIAGNOSIS — I2510 Atherosclerotic heart disease of native coronary artery without angina pectoris: Secondary | ICD-10-CM | POA: Diagnosis not present

## 2018-09-03 DIAGNOSIS — E785 Hyperlipidemia, unspecified: Secondary | ICD-10-CM

## 2018-09-03 DIAGNOSIS — I5022 Chronic systolic (congestive) heart failure: Secondary | ICD-10-CM | POA: Diagnosis not present

## 2018-09-03 DIAGNOSIS — I739 Peripheral vascular disease, unspecified: Secondary | ICD-10-CM | POA: Diagnosis not present

## 2018-09-03 DIAGNOSIS — Z01818 Encounter for other preprocedural examination: Secondary | ICD-10-CM | POA: Diagnosis not present

## 2018-09-03 NOTE — Progress Notes (Signed)
Cardiology Office Note   Date:  09/03/2018   ID:  [name], DOB 1961-11-06, MRN [id number]  PCP:  Ria Bush, MD  Cardiologist: Dr. Haroldine Laws  Chief Complaint  Patient presents with  . other    Ref by Dr. Haroldine Laws for Claudication-like symptoms has been seen by [facility] (Dr. Donnetta Hutching) ABI of 1.06 on the right and 0.85 on the left, on exam per Dr. Haroldine Laws pulses are diminished refer to [facility] team for second opinion and possible angio. Pt. c/o chest pain, shortness of breath, bilateral leg pain when walking with the pain mostly being in the calves with more on the right.       History of Present Illness: Calvin Francis is a 57 y.o. male who was referred by Dr. Haroldine Laws for evaluation and management of claudication.  The patient has extensive cardiac history including coronary artery disease status post CABG in 4081, chronic systolic heart failure due to ischemic cardiomyopathy, hypertension, obesity, hyperlipidemia and previous tobacco use.  He quit smoking last year.  Echocardiogram in July showed an EF of 20 to 25%.  The patient is being considered for advanced heart failure therapy including left ventricular assist device or cardiac transplantation. He had a recent right heart catheterization that showed normal hemodynamics. The patient was referred to me for persistent bilateral leg claudication in spite of negative noninvasive vascular work-up.  He was seen in the past by Dr. early for this.  ABI was normal bilaterally last year with normal waveforms. The patient complains of severe calf claudication especially affecting the right side.  This happens after walking about 100 feet and forces him to stop and rest for a few minutes before he can resume.  He is actually more limited by the symptoms then shortness of breath or chest pain.  He has no pain at rest.    Past Medical History:  Diagnosis Date  . 3-vessel CAD 02/22/2004   mid RCA 100% occluded, L-R collaterals; LAD  60-70% bifurcation lesion; Cx proximal 70% and mid 80% after OM1, OM1 100% occluded. --> CABG x 4  . Barrett's esophagus    s/p EGD several years ago with [place]  . Bronchitis, mucopurulent recurrent ([place])   . CAD (coronary artery disease) of bypass graft 5/'12; 12/'13   Cath for angina and Abn Myoview ST: SVG-OM1 now occluded, attempt at PCI to the native circumflex OM1 unsuccessful; distal LAD beyond patent LIMA 70-80% (not PCI amenable); free radial-OM 2 patent; EF 45-50%  . Erectile dysfunction   . Former heavy cigarette smoker (20-39 per day) May 2013   . GERD (gastroesophageal reflux disease)    ?barrett's, with esophageal dysmotility, on omeprazole  . Glucose intolerance (impaired glucose tolerance)  2009   Prediabetes  . Gout   . History of Non-STEMI (non-ST elevated myocardial infarction) 02/22/2004   Three-vessel disease --> referred for CABG  . History of Non-STEMI (non-ST elevated myocardial infarction) December 2011   Hazy lesion in SVG-OM1 --> staged PCI with 4.0 mm x 20 mm vision BMS (4.5 mm)  . HLD (hyperlipidemia)    Statin intolerant  . Hypertension, benign   . Ischemic cardiomyopathy Echo 10/2012   EF ~40%; moderate Posterior HypoKinesis, minld-moderate inferior Hypokinesis; mild RV dilation, mild concentric LVH  . 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 Stable angina ([place]) 11/2012   Chronic,some what stable but still present; cardiac cath results above, no significant change  from 2012  . Testosterone deficiency    On replacement; goal is low normal.  . Varicose veins December 2013   with venous reflux status VNUS ablation of left greater saphenous wein    Past Surgical History:  Procedure Laterality Date  . CARDIAC CATHETERIZATION  May 01 2011   knowwn occlusion of SVG to RCA  as well as native RCA ; PCI to the SVG to OM1 ;patent LIMA to LAD with diffuse distal  LAD 70-80%,small  vessel dx not thought to be amenable to PCI .RCA  100% occluded ,OM2 patent,Circ diseased aftr OM1. EF 45-50%  . CORONARY ANGIOPLASTY WITH STENT PLACEMENT  12/25/2010   s/p autologous vessel blockage  . CORONARY ARTERY BYPASS GRAFT  02/27/04   LIMA -LAD, Free Radial-OM2 -- patent; SVG-OM1, SVG-RCA, - 100% occluded by recent cath  . ESOPHAGOGASTRODUODENOSCOPY    . LEFT HEART CATHETERIZATION WITH CORONARY/GRAFT ANGIOGRAM N/A 12/27/2012   Procedure: LEFT HEART CATHETERIZATION WITH Beatrix Fetters;  Surgeon: Leonie Man, MD;  Location: [facility] CATH LAB: Known nRCA, mLAD, OM1 & OM2 100%, Known SVG-RCA 100%, SVG-OM1 100% ISR. Patent LIMA-dLAD w/ 60-80% dLAD. Patent frRAD-OM2. LCx-RPL collaterals.   EF ~35%  . lower venous extremity doppler Left 08/15/2011   normal ,s/p vein ablation  . MET/CPET  11/02/2012   suboptimal effort but peak VO2  was 52% which relatively significant. Myoview was suggestive for ant. ischemia may go along with his distal LAD   . NM MYOCAR PERF WALL MOTION  12/15/2012   high risk study  . RIGHT HEART CATH N/A 07/28/2018   Procedure: RIGHT HEART CATH;  Surgeon: Jolaine Artist, MD;  Location: [place];  Service: Cardiovascular;  Laterality: N/A;  . RIGHT/LEFT HEART CATH AND CORONARY/GRAFT ANGIOGRAPHY N/A 03/05/2018   Procedure: RIGHT/LEFT HEART CATH AND CORONARY/GRAFT ANGIOGRAPHY;  Surgeon: Belva Crome, MD;  Location: [facility];  Service: Cardiovascular;  Laterality: N/A;  . TRANSTHORACIC ECHOCARDIOGRAM  10/2012   mild LVH, EF 40%, mod posterior and inf wall hypokinesis,mild concentric LVH;; RV dilated, normal  fx.trace MR  . VEIN SURGERY  2011   solomon - h/o vericose veins     Current Outpatient Medications  Medication Sig Dispense Refill  . ALPRAZolam (XANAX) 0.25 MG tablet Take 1 tablet (0.25 mg total) by mouth 2 (two) times daily as needed for anxiety. 60 tablet 3  . APPLE CIDER VINEGAR PO Take 480 mg by mouth every morning.    Marland Kitchen aspirin 81 MG chewable tablet Chew 81 mg by mouth daily.     . carvedilol (COREG) 3.125 MG tablet Take 1 tablet (3.125 mg total) by mouth 2 (two) times daily with a meal. 180 tablet 6  . co-enzyme Q-10 30 MG capsule Take 30 mg by mouth 2 (two) times daily.    Marland Kitchen COLCRYS 0.6 MG tablet TAKE AS DIRECTED 2 TABLETS BY MOUTH ON FIRST DAY THEN ONE DAILY AS NEEDED 30 tablet 3  . ezetimibe (ZETIA) 10 MG tablet Take 1 tablet (10 mg total) by mouth daily. 30 tablet 3  . fluorouracil (EFUDEX) 5 % cream Apply topically 2 (two) times daily. 40 g 1  . fluticasone (FLONASE) 50 MCG/ACT nasal spray Place 2 sprays into both nostrils daily as needed for allergies or rhinitis.    . furosemide (LASIX) 20 MG tablet Take 1 tablet (20 mg total) by mouth daily. 90 tablet 3  . nitroGLYCERIN (NITROSTAT) 0.4 MG SL tablet Place 1 tablet (0.4 mg total) under the  tongue every 5 (five) minutes as needed for chest pain. 25 tablet 0  . omeprazole (PRILOSEC OTC) 20 MG tablet Take 40 mg by mouth daily.     . prasugrel (EFFIENT) 10 MG TABS tablet Take 1 tablet (10 mg total) by mouth daily. 90 tablet 3  . pravastatin (PRAVACHOL) 40 MG tablet Take 20 mg by mouth every other day.    . ranolazine (RANEXA) 1000 MG SR tablet Take 1 tablet (1,000 mg total) by mouth twice daily, as directed. 180 tablet 3  . sacubitril-valsartan (ENTRESTO) 49-51 MG Take 1 tablet by mouth 2 (two) times daily. 60 tablet 3  . spironolactone (ALDACTONE) 25 MG tablet Take 0.5 tablets (12.5 mg total) by mouth daily. 45 tablet 3  . vitamin B-12 (CYANOCOBALAMIN) 500 MCG tablet Take 500 mcg by mouth daily.     No current facility-administered medications for this visit.     Allergies:   Statins; Zetia [ezetimibe]; Clopidogrel; and Codeine    Social History:  The patient  reports that he quit smoking about 6 years ago. His smoking use included cigarettes. He has a 10.00 pack-year smoking history. He has never used smokeless tobacco. He reports that he drinks alcohol. He reports that he does not use drugs.   Family History:   The patient's family history includes Coronary artery disease in his paternal uncle; Diabetes in his paternal aunt; Stroke in his maternal uncle.    ROS:  Please see the history of present illness.   Otherwise, review of systems are positive for none.   All other systems are reviewed and negative.    PHYSICAL EXAM: VS:  BP 112/74 (BP Location: Right Arm, Patient Position: Sitting, Cuff Size: Normal)   Pulse 80   Ht _0  (1.88 m)   Wt 252 lb (114.3 kg)   SpO2 99%   BMI 32.35 kg/m  , BMI Body mass index is 32.35 kg/m. GEN: Well nourished, well developed, in no acute distress  HEENT: normal  Neck: no JVD, carotid bruits, or masses Cardiac: RRR; no murmurs, rubs, or gallops,no edema  Respiratory:  clear to auscultation bilaterally, normal work of breathing GI: soft, nontender, nondistended, + BS MS: no deformity or atrophy  Skin: warm and dry, no rash Neuro:  Strength and sensation are intact Psych: euthymic mood, full affect Vascular: Femoral pulses normal bilaterally.  Posterior tibial is not palpable on the right side and +2 on the left side.  Dorsalis pedis: Not palpable on the right side and +1 on the left side.  Right radial artery is harvested for previous CABG.  Left radial pulses normal.  EKG:  EKG is ordered today. The ekg ordered today demonstrates normal sinus rhythm with nonspecific ST changes   Recent Labs: 03/07/2018: Magnesium 1.9 07/26/2018: ALT 26; B Natriuretic Peptide 180.7; BUN 9; Creatinine, Ser 1.12; Hemoglobin 13.3; Platelets 198; Potassium 4.0; Sodium 139    Lipid Panel    Component Value Date/Time   CHOL 174 03/03/2018 0215   TRIG 151 (H) 03/03/2018 0215   HDL 33 (L) 03/03/2018 0215   CHOLHDL 5.3 03/03/2018 0215   VLDL 30 03/03/2018 0215   LDLCALC 111 (H) 03/03/2018 0215   LDLCALC 122 (H) 10/13/2017 0835   LDLDIRECT 158.0 09/29/2016 1008      Wt Readings from Last 3 Encounters:  09/03/18 252 lb (114.3 kg)  07/28/18 250 lb (113.4 kg)  07/26/18  251 lb 12.8 oz (114.2 kg)       PAD Screen 09/03/2018  Previous  PAD dx? No  Previous surgical procedure? No  Pain with walking? Yes  Subsides with rest? Yes  Feet/toe relief with dangling? No  Painful, non-healing ulcers? No  Extremities discolored? No      ASSESSMENT AND PLAN:  1.  Intermittent claudication worse on the right side mostly affecting the calf: His symptoms are severe and lifestyle limiting (Rutherford class III).  In spite of unremarkable noninvasive testing last year, his symptoms are quite concerning and I cannot find an alternative etiology to his symptoms other than PAD.  Low cardiac output could be contributing to some of his symptoms but interestingly, recent right heart catheterization showed normal hemodynamics. Also by physical exam, the distal pulses seems to be diminished on the right side compared to the left.  Due to all of that and given that he is being considered for cardiac transplant, I think it is important to exclude underlying peripheral arterial disease.  I discussed the options with him and recommended proceeding with abdominal aortogram, lower extreme with runoff and possible endovascular intervention.  I discussed the procedure in details as well as risks and benefits.  Planned access via the left common femoral artery.  He reports that they had difficulty controlling the bleeding in the past with most recent femoral catheterization.  He is on dual antiplatelet therapy with aspirin and Prasugrel.  2.  Chronic systolic heart failure: He appears to be euvolemic and currently on optimal medical therapy.  3.  Coronary artery disease involving native coronary arteries: No angina at the present time.  4.  Hyperlipidemia: Intolerance to most statins but seems to be tolerating pravastatin.    Disposition:   FU with me in 1 month  Signed,  Kathlyn Sacramento, MD  09/03/2018 12:24 PM    [place]

## 2018-09-03 NOTE — Telephone Encounter (Signed)
Name of Medication: Alprazolam Name of Pharmacy: [facility] [address] Last Fill or Written Date and Quantity: 03/26/18, #60 Last Office Visit and Type: 03/29/18, acute Next Office Visit and Type: none Last Controlled Substance Agreement Date: none Last UDS: none

## 2018-09-03 NOTE — Telephone Encounter (Signed)
Eprescribed.

## 2018-09-03 NOTE — H&P (View-Only) (Signed)
Cardiology Office Note   Date:  09/03/2018   ID:  Calvin Francis, DOB 1961-11-06, MRN [id number]  PCP:  Calvin Bush, MD  Cardiologist: Dr. Haroldine Francis  Chief Complaint  Patient presents with  . other    Ref by Dr. Haroldine Francis for Claudication-like symptoms has been seen by [facility] (Dr. Donnetta Francis) ABI of 1.06 on the right and 0.85 on the left, on exam per Dr. Haroldine Francis pulses are diminished refer to [facility] team for second opinion and possible angio. Pt. c/o chest pain, shortness of breath, bilateral leg pain when walking with the pain mostly being in the calves with more on the right.       History of Present Illness: Calvin Francis is a 57 y.o. male who was referred by Dr. Haroldine Francis for evaluation and management of claudication.  The patient has extensive cardiac history including coronary artery disease status post CABG in 4081, chronic systolic heart failure due to ischemic cardiomyopathy, hypertension, obesity, hyperlipidemia and previous tobacco use.  He quit smoking last year.  Echocardiogram in July showed an EF of 20 to 25%.  The patient is being considered for advanced heart failure therapy including left ventricular assist device or cardiac transplantation. He had a recent right heart catheterization that showed normal hemodynamics. The patient was referred to me for persistent bilateral leg claudication in spite of negative noninvasive vascular work-up.  He was seen in the past by Dr. early for this.  ABI was normal bilaterally last year with normal waveforms. The patient complains of severe calf claudication especially affecting the right side.  This happens after walking about 100 feet and forces him to stop and rest for a few minutes before he can resume.  He is actually more limited by the symptoms then shortness of breath or chest pain.  He has no pain at rest.    Past Medical History:  Diagnosis Date  . 3-vessel CAD 02/22/2004   mid RCA 100% occluded, L-R collaterals; LAD  60-70% bifurcation lesion; Cx proximal 70% and mid 80% after OM1, OM1 100% occluded. --> CABG x 4  . Barrett's esophagus    s/p EGD several years ago with [place]  . Bronchitis, mucopurulent recurrent ([place])   . CAD (coronary artery disease) of bypass graft 5/'12; 12/'13   Cath for angina and Abn Myoview ST: SVG-OM1 now occluded, attempt at PCI to the native circumflex OM1 unsuccessful; distal LAD beyond patent LIMA 70-80% (not PCI amenable); free radial-OM 2 patent; EF 45-50%  . Erectile dysfunction   . Former heavy cigarette smoker (20-39 per day) May 2013   . GERD (gastroesophageal reflux disease)    ?barrett's, with esophageal dysmotility, on omeprazole  . Glucose intolerance (impaired glucose tolerance)  2009   Prediabetes  . Gout   . History of Non-STEMI (non-ST elevated myocardial infarction) 02/22/2004   Three-vessel disease --> referred for CABG  . History of Non-STEMI (non-ST elevated myocardial infarction) December 2011   Hazy lesion in SVG-OM1 --> staged PCI with 4.0 mm x 20 mm vision BMS (4.5 mm)  . HLD (hyperlipidemia)    Statin intolerant  . Hypertension, benign   . Ischemic cardiomyopathy Echo 10/2012   EF ~40%; moderate Posterior HypoKinesis, minld-moderate inferior Hypokinesis; mild RV dilation, mild concentric LVH  . 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 Stable angina ([place]) 11/2012   Chronic,some what stable but still present; cardiac cath results above, no significant change  from 2012  . Testosterone deficiency    On replacement; goal is low normal.  . Varicose veins December 2013   with venous reflux status VNUS ablation of left greater saphenous wein    Past Surgical History:  Procedure Laterality Date  . CARDIAC CATHETERIZATION  May 01 2011   knowwn occlusion of SVG to RCA  as well as native RCA ; PCI to the SVG to OM1 ;patent LIMA to LAD with diffuse distal  LAD 70-80%,small  vessel dx not thought to be amenable to PCI .RCA  100% occluded ,OM2 patent,Circ diseased aftr OM1. EF 45-50%  . CORONARY ANGIOPLASTY WITH STENT PLACEMENT  12/25/2010   s/p autologous vessel blockage  . CORONARY ARTERY BYPASS GRAFT  02/27/04   LIMA -LAD, Free Radial-OM2 -- patent; SVG-OM1, SVG-RCA, - 100% occluded by recent cath  . ESOPHAGOGASTRODUODENOSCOPY    . LEFT HEART CATHETERIZATION WITH CORONARY/GRAFT ANGIOGRAM N/A 12/27/2012   Procedure: LEFT HEART CATHETERIZATION WITH Beatrix Fetters;  Surgeon: Leonie Man, MD;  Location: [facility] CATH LAB: Known nRCA, mLAD, OM1 & OM2 100%, Known SVG-RCA 100%, SVG-OM1 100% ISR. Patent LIMA-dLAD w/ 60-80% dLAD. Patent frRAD-OM2. LCx-RPL collaterals.   EF ~35%  . lower venous extremity doppler Left 08/15/2011   normal ,s/p vein ablation  . MET/CPET  11/02/2012   suboptimal effort but peak VO2  was 52% which relatively significant. Myoview was suggestive for ant. ischemia may go along with his distal LAD   . NM MYOCAR PERF WALL MOTION  12/15/2012   high risk study  . RIGHT HEART CATH N/A 07/28/2018   Procedure: RIGHT HEART CATH;  Surgeon: Jolaine Artist, MD;  Location: [place];  Service: Cardiovascular;  Laterality: N/A;  . RIGHT/LEFT HEART CATH AND CORONARY/GRAFT ANGIOGRAPHY N/A 03/05/2018   Procedure: RIGHT/LEFT HEART CATH AND CORONARY/GRAFT ANGIOGRAPHY;  Surgeon: Belva Crome, MD;  Location: [facility];  Service: Cardiovascular;  Laterality: N/A;  . TRANSTHORACIC ECHOCARDIOGRAM  10/2012   mild LVH, EF 40%, mod posterior and inf wall hypokinesis,mild concentric LVH;; RV dilated, normal  fx.trace MR  . VEIN SURGERY  2011   solomon - h/o vericose veins     Current Outpatient Medications  Medication Sig Dispense Refill  . ALPRAZolam (XANAX) 0.25 MG tablet Take 1 tablet (0.25 mg total) by mouth 2 (two) times daily as needed for anxiety. 60 tablet 3  . APPLE CIDER VINEGAR PO Take 480 mg by mouth every morning.    Marland Kitchen aspirin 81 MG chewable tablet Chew 81 mg by mouth daily.     . carvedilol (COREG) 3.125 MG tablet Take 1 tablet (3.125 mg total) by mouth 2 (two) times daily with a meal. 180 tablet 6  . co-enzyme Q-10 30 MG capsule Take 30 mg by mouth 2 (two) times daily.    Marland Kitchen COLCRYS 0.6 MG tablet TAKE AS DIRECTED 2 TABLETS BY MOUTH ON FIRST DAY THEN ONE DAILY AS NEEDED 30 tablet 3  . ezetimibe (ZETIA) 10 MG tablet Take 1 tablet (10 mg total) by mouth daily. 30 tablet 3  . fluorouracil (EFUDEX) 5 % cream Apply topically 2 (two) times daily. 40 g 1  . fluticasone (FLONASE) 50 MCG/ACT nasal spray Place 2 sprays into both nostrils daily as needed for allergies or rhinitis.    . furosemide (LASIX) 20 MG tablet Take 1 tablet (20 mg total) by mouth daily. 90 tablet 3  . nitroGLYCERIN (NITROSTAT) 0.4 MG SL tablet Place 1 tablet (0.4 mg total) under the  tongue every 5 (five) minutes as needed for chest pain. 25 tablet 0  . omeprazole (PRILOSEC OTC) 20 MG tablet Take 40 mg by mouth daily.     . prasugrel (EFFIENT) 10 MG TABS tablet Take 1 tablet (10 mg total) by mouth daily. 90 tablet 3  . pravastatin (PRAVACHOL) 40 MG tablet Take 20 mg by mouth every other day.    . ranolazine (RANEXA) 1000 MG SR tablet Take 1 tablet (1,000 mg total) by mouth twice daily, as directed. 180 tablet 3  . sacubitril-valsartan (ENTRESTO) 49-51 MG Take 1 tablet by mouth 2 (two) times daily. 60 tablet 3  . spironolactone (ALDACTONE) 25 MG tablet Take 0.5 tablets (12.5 mg total) by mouth daily. 45 tablet 3  . vitamin B-12 (CYANOCOBALAMIN) 500 MCG tablet Take 500 mcg by mouth daily.     No current facility-administered medications for this visit.     Allergies:   Statins; Zetia [ezetimibe]; Clopidogrel; and Codeine    Social History:  The patient  reports that he quit smoking about 6 years ago. His smoking use included cigarettes. He has a 10.00 pack-year smoking history. He has never used smokeless tobacco. He reports that he drinks alcohol. He reports that he does not use drugs.   Family History:   The patient's family history includes Coronary artery disease in his paternal uncle; Diabetes in his paternal aunt; Stroke in his maternal uncle.    ROS:  Please see the history of present illness.   Otherwise, review of systems are positive for none.   All other systems are reviewed and negative.    PHYSICAL EXAM: VS:  BP 112/74 (BP Location: Right Arm, Patient Position: Sitting, Cuff Size: Normal)   Pulse 80   Ht _0  (1.88 m)   Wt 252 lb (114.3 kg)   SpO2 99%   BMI 32.35 kg/m  , BMI Body mass index is 32.35 kg/m. GEN: Well nourished, well developed, in no acute distress  HEENT: normal  Neck: no JVD, carotid bruits, or masses Cardiac: RRR; no murmurs, rubs, or gallops,no edema  Respiratory:  clear to auscultation bilaterally, normal work of breathing GI: soft, nontender, nondistended, + BS MS: no deformity or atrophy  Skin: warm and dry, no rash Neuro:  Strength and sensation are intact Psych: euthymic mood, full affect Vascular: Femoral pulses normal bilaterally.  Posterior tibial is not palpable on the right side and +2 on the left side.  Dorsalis pedis: Not palpable on the right side and +1 on the left side.  Right radial artery is harvested for previous CABG.  Left radial pulses normal.  EKG:  EKG is ordered today. The ekg ordered today demonstrates normal sinus rhythm with nonspecific ST changes   Recent Labs: 03/07/2018: Magnesium 1.9 07/26/2018: ALT 26; B Natriuretic Peptide 180.7; BUN 9; Creatinine, Ser 1.12; Hemoglobin 13.3; Platelets 198; Potassium 4.0; Sodium 139    Lipid Panel    Component Value Date/Time   CHOL 174 03/03/2018 0215   TRIG 151 (H) 03/03/2018 0215   HDL 33 (L) 03/03/2018 0215   CHOLHDL 5.3 03/03/2018 0215   VLDL 30 03/03/2018 0215   LDLCALC 111 (H) 03/03/2018 0215   LDLCALC 122 (H) 10/13/2017 0835   LDLDIRECT 158.0 09/29/2016 1008      Wt Readings from Last 3 Encounters:  09/03/18 252 lb (114.3 kg)  07/28/18 250 lb (113.4 kg)  07/26/18  251 lb 12.8 oz (114.2 kg)       PAD Screen 09/03/2018  Previous  PAD dx? No  Previous surgical procedure? No  Pain with walking? Yes  Subsides with rest? Yes  Feet/toe relief with dangling? No  Painful, non-healing ulcers? No  Extremities discolored? No      ASSESSMENT AND PLAN:  1.  Intermittent claudication worse on the right side mostly affecting the calf: His symptoms are severe and lifestyle limiting (Rutherford class III).  In spite of unremarkable noninvasive testing last year, his symptoms are quite concerning and I cannot find an alternative etiology to his symptoms other than PAD.  Low cardiac output could be contributing to some of his symptoms but interestingly, recent right heart catheterization showed normal hemodynamics. Also by physical exam, the distal pulses seems to be diminished on the right side compared to the left.  Due to all of that and given that he is being considered for cardiac transplant, I think it is important to exclude underlying peripheral arterial disease.  I discussed the options with him and recommended proceeding with abdominal aortogram, lower extreme with runoff and possible endovascular intervention.  I discussed the procedure in details as well as risks and benefits.  Planned access via the left common femoral artery.  He reports that they had difficulty controlling the bleeding in the past with most recent femoral catheterization.  He is on dual antiplatelet therapy with aspirin and Prasugrel.  2.  Chronic systolic heart failure: He appears to be euvolemic and currently on optimal medical therapy.  3.  Coronary artery disease involving native coronary arteries: No angina at the present time.  4.  Hyperlipidemia: Intolerance to most statins but seems to be tolerating pravastatin.    Disposition:   FU with me in 1 month  Signed,  Kathlyn Sacramento, MD  09/03/2018 12:24 PM    [place]

## 2018-09-03 NOTE — Patient Instructions (Addendum)
Medication Instructions: Your physician recommends that you continue on your current medications as directed. Please refer to the Current Medication list given to you today.  If you need a refill on your cardiac medications before your next appointment, please call your pharmacy.   Follow-Up: Your physician wants you to follow-up in one month with Dr. Kirke Corin.   Thank you for choosing Heartcare at [facility]!        [address] MED[facility] CARDIOVASCULAR DIVISION [facility] [address] [name], [address] [place] [address] Dept: [phone] Loc: [phone]  Calvin Francis  09/03/2018  You are scheduled for a Peripheral Angiogram on Wednesday, September 11 with Dr. Lorine Bears.  1. Please arrive at the [facility] (Main Entrance A) at [facility]: [address] [place], [place] [address] at 6:30 AM (This time is two hours before your procedure to ensure your preparation). Free valet parking service is available.   Special note: Every effort is made to have your procedure done on time. Please understand that emergencies sometimes delay scheduled procedures.  2. Diet: Do not eat solid foods after midnight.  The patient may have clear liquids until 5am upon the day of the procedure.  3. Labs: You will need to have blood drawn today.  4. Medication instructions in preparation for your procedure:  Hold Furosemide the morning of the procedure  On the morning of your procedure, take your Aspirin and Effient and any morning medicines NOT listed above.  You may use sips of water.  5. Plan for one night stay--bring personal belongings. 6. Bring a current list of your medications and current insurance cards. 7. You MUST have a responsible person to drive you home. 8. Someone MUST be with you the first 24 hours after you arrive home or your discharge will be delayed. 9. Please wear clothes that are easy to get on and off and wear slip-on  shoes.  Thank you for allowing [place] to care for you!   -- [name]

## 2018-09-04 LAB — BASIC METABOLIC PANEL
BUN/Creatinine Ratio: 11 (ref 9–20)
BUN: 13 mg/dL (ref 6–24)
CALCIUM: 9.2 mg/dL (ref 8.7–10.2)
CHLORIDE: 101 mmol/L (ref 96–106)
CO2: 21 mmol/L (ref 20–29)
Creatinine, Ser: 1.2 mg/dL (ref 0.76–1.27)
GFR calc Af Amer: 78 mL/min/{1.73_m2} (ref >59)
GFR calc non Af Amer: 67 mL/min/{1.73_m2} (ref >59)
GLUCOSE: 83 mg/dL (ref 65–99)
Potassium: 4.6 mmol/L (ref 3.5–5.2)
SODIUM: 138 mmol/L (ref 134–144)

## 2018-09-04 LAB — CBC
Hematocrit: 39 % (ref 37.5–51.0)
Hemoglobin: 13.3 g/dL (ref 13.0–17.7)
MCH: 30 pg (ref 26.6–33.0)
MCHC: 34.1 g/dL (ref 31.5–35.7)
MCV: 88 fL (ref 79–97)
Platelets: 202 10*3/uL (ref 150–450)
RBC: 4.44 x10E6/uL (ref 4.14–5.80)
RDW: 14.2 % (ref 12.3–15.4)
WBC: 5.5 10*3/uL (ref 3.4–10.8)

## 2018-09-06 ENCOUNTER — Other Ambulatory Visit: Payer: Self-pay

## 2018-09-06 ENCOUNTER — Other Ambulatory Visit: Payer: Self-pay | Admitting: Family Medicine

## 2018-09-06 MED ORDER — EZETIMIBE 10 MG PO TABS: 10.0000 mg | ORAL_TABLET | Freq: Every day | ORAL | 8 refills | Status: DC

## 2018-09-06 NOTE — Telephone Encounter (Signed)
Nitroglycerin Last filled:  10/13/16, #25 Last OV:  03/29/18 Next OV: none

## 2018-09-08 ENCOUNTER — Other Ambulatory Visit: Payer: Self-pay

## 2018-09-08 ENCOUNTER — Ambulatory Visit (HOSPITAL_COMMUNITY)
Admission: RE | Admit: 2018-09-08 | Discharge: 2018-09-08 | Disposition: A | Discharge status: Home / Self Care | Payer: Medicare HMO | Source: Ambulatory Visit | Attending: Cardiovascular Disease | Admitting: Cardiovascular Disease

## 2018-09-08 ENCOUNTER — Encounter (HOSPITAL_COMMUNITY)
Admission: RE | Disposition: A | Discharge status: Home / Self Care | Payer: Self-pay | Source: Ambulatory Visit | Attending: Cardiovascular Disease

## 2018-09-08 DIAGNOSIS — Z8679 Personal history of other diseases of the circulatory system: Secondary | ICD-10-CM | POA: Insufficient documentation

## 2018-09-08 DIAGNOSIS — Z955 Presence of coronary angioplasty implant and graft: Secondary | ICD-10-CM | POA: Diagnosis not present

## 2018-09-08 DIAGNOSIS — K2270 Barrett's esophagus without dysplasia: Secondary | ICD-10-CM | POA: Insufficient documentation

## 2018-09-08 DIAGNOSIS — Z79899 Other long term (current) drug therapy: Secondary | ICD-10-CM | POA: Diagnosis not present

## 2018-09-08 DIAGNOSIS — I110 Hypertensive heart disease with heart failure: Secondary | ICD-10-CM | POA: Insufficient documentation

## 2018-09-08 DIAGNOSIS — Z9889 Other specified postprocedural states: Secondary | ICD-10-CM | POA: Insufficient documentation

## 2018-09-08 DIAGNOSIS — I255 Ischemic cardiomyopathy: Secondary | ICD-10-CM | POA: Diagnosis not present

## 2018-09-08 DIAGNOSIS — Z8249 Family history of ischemic heart disease and other diseases of the circulatory system: Secondary | ICD-10-CM | POA: Insufficient documentation

## 2018-09-08 DIAGNOSIS — R7303 Prediabetes: Secondary | ICD-10-CM | POA: Insufficient documentation

## 2018-09-08 DIAGNOSIS — Z6832 Body mass index (BMI) 32.0-32.9, adult: Secondary | ICD-10-CM | POA: Insufficient documentation

## 2018-09-08 DIAGNOSIS — I5022 Chronic systolic (congestive) heart failure: Secondary | ICD-10-CM | POA: Insufficient documentation

## 2018-09-08 DIAGNOSIS — Z951 Presence of aortocoronary bypass graft: Secondary | ICD-10-CM | POA: Insufficient documentation

## 2018-09-08 DIAGNOSIS — I2510 Atherosclerotic heart disease of native coronary artery without angina pectoris: Secondary | ICD-10-CM | POA: Diagnosis not present

## 2018-09-08 DIAGNOSIS — I70211 Atherosclerosis of native arteries of extremities with intermittent claudication, right leg: Secondary | ICD-10-CM | POA: Insufficient documentation

## 2018-09-08 DIAGNOSIS — I252 Old myocardial infarction: Secondary | ICD-10-CM | POA: Diagnosis not present

## 2018-09-08 DIAGNOSIS — K219 Gastro-esophageal reflux disease without esophagitis: Secondary | ICD-10-CM | POA: Insufficient documentation

## 2018-09-08 DIAGNOSIS — Z87891 Personal history of nicotine dependence: Secondary | ICD-10-CM | POA: Insufficient documentation

## 2018-09-08 DIAGNOSIS — E669 Obesity, unspecified: Secondary | ICD-10-CM | POA: Diagnosis not present

## 2018-09-08 DIAGNOSIS — Z7982 Long term (current) use of aspirin: Secondary | ICD-10-CM | POA: Diagnosis not present

## 2018-09-08 DIAGNOSIS — Z885 Allergy status to narcotic agent status: Secondary | ICD-10-CM | POA: Diagnosis not present

## 2018-09-08 DIAGNOSIS — N529 Male erectile dysfunction, unspecified: Secondary | ICD-10-CM | POA: Diagnosis not present

## 2018-09-08 DIAGNOSIS — Z823 Family history of stroke: Secondary | ICD-10-CM | POA: Insufficient documentation

## 2018-09-08 DIAGNOSIS — E785 Hyperlipidemia, unspecified: Secondary | ICD-10-CM | POA: Insufficient documentation

## 2018-09-08 DIAGNOSIS — I7092 Chronic total occlusion of artery of the extremities: Secondary | ICD-10-CM | POA: Diagnosis not present

## 2018-09-08 DIAGNOSIS — I739 Peripheral vascular disease, unspecified: Secondary | ICD-10-CM

## 2018-09-08 DIAGNOSIS — Z888 Allergy status to other drugs, medicaments and biological substances status: Secondary | ICD-10-CM | POA: Insufficient documentation

## 2018-09-08 DIAGNOSIS — M109 Gout, unspecified: Secondary | ICD-10-CM | POA: Diagnosis not present

## 2018-09-08 DIAGNOSIS — I771 Stricture of artery: Secondary | ICD-10-CM | POA: Diagnosis not present

## 2018-09-08 DIAGNOSIS — I70203 Unspecified atherosclerosis of native arteries of extremities, bilateral legs: Secondary | ICD-10-CM | POA: Diagnosis not present

## 2018-09-08 HISTORY — PX: ABDOMINAL AORTOGRAM W/LOWER EXTREMITY: CATH118223

## 2018-09-08 SURGERY — ABDOMINAL AORTOGRAM W/LOWER EXTREMITY
Anesthesia: LOCAL

## 2018-09-08 MED ORDER — FENTANYL CITRATE (PF) 100 MCG/2ML IJ SOLN
INTRAMUSCULAR | Status: DC | PRN
  Administered 2018-09-08: 50 ug via INTRAVENOUS

## 2018-09-08 MED ORDER — ONDANSETRON HCL 4 MG/2ML IJ SOLN: 4.0000 mg | Freq: Four times a day (QID) | INTRAMUSCULAR | Status: DC | PRN

## 2018-09-08 MED ORDER — SODIUM CHLORIDE 0.9 % IV SOLN
INTRAVENOUS | Status: DC
  Administered 2018-09-08: 07:00:00 via INTRAVENOUS

## 2018-09-08 MED ORDER — SODIUM CHLORIDE 0.9 % IV SOLN: 250.0000 mL | INTRAVENOUS | Status: DC | PRN

## 2018-09-08 MED ORDER — LIDOCAINE HCL (PF) 1 % IJ SOLN
INTRAMUSCULAR | Status: DC | PRN
  Administered 2018-09-08: 14 mL via INTRADERMAL

## 2018-09-08 MED ORDER — SODIUM CHLORIDE 0.9 % IV SOLN: INTRAVENOUS | Status: AC

## 2018-09-08 MED ORDER — SODIUM CHLORIDE 0.9% FLUSH: 3.0000 mL | Freq: Two times a day (BID) | INTRAVENOUS | Status: DC

## 2018-09-08 MED ORDER — MIDAZOLAM HCL 2 MG/2ML IJ SOLN
INTRAMUSCULAR | Status: AC
  Filled 2018-09-08: qty 2

## 2018-09-08 MED ORDER — MIDAZOLAM HCL 2 MG/2ML IJ SOLN
INTRAMUSCULAR | Status: DC | PRN
  Administered 2018-09-08: 1 mg via INTRAVENOUS

## 2018-09-08 MED ORDER — FENTANYL CITRATE (PF) 100 MCG/2ML IJ SOLN
INTRAMUSCULAR | Status: AC
  Filled 2018-09-08: qty 2

## 2018-09-08 MED ORDER — SODIUM CHLORIDE 0.9% FLUSH: 3.0000 mL | INTRAVENOUS | Status: DC | PRN

## 2018-09-08 MED ORDER — ASPIRIN 81 MG PO CHEW: 81.0000 mg | CHEWABLE_TABLET | ORAL | Status: DC

## 2018-09-08 MED ORDER — LIDOCAINE HCL (PF) 1 % IJ SOLN
INTRAMUSCULAR | Status: AC
  Filled 2018-09-08: qty 30

## 2018-09-08 MED ORDER — HEPARIN (PORCINE) IN NACL 1000-0.9 UT/500ML-% IV SOLN
INTRAVENOUS | Status: DC | PRN
  Administered 2018-09-08 (×2): 500 mL

## 2018-09-08 MED ORDER — HEPARIN (PORCINE) IN NACL 1000-0.9 UT/500ML-% IV SOLN
INTRAVENOUS | Status: AC
  Filled 2018-09-08: qty 1000

## 2018-09-08 MED ORDER — ACETAMINOPHEN 325 MG PO TABS: 650.0000 mg | ORAL_TABLET | ORAL | Status: DC | PRN

## 2018-09-08 SURGICAL SUPPLY — 13 items
CATH ANGIO 5F PIGTAIL 65CM (CATHETERS) ×1 IMPLANT
CATH CROSS OVER TEMPO 5F (CATHETERS) ×1 IMPLANT
CATH STRAIGHT 5FR 65CM (CATHETERS) ×1 IMPLANT
KIT MICROPUNCTURE NIT STIFF (SHEATH) ×1 IMPLANT
KIT PV (KITS) ×2 IMPLANT
SHEATH PINNACLE 5F 10CM (SHEATH) ×1 IMPLANT
SHEATH PROBE COVER 6X72 (BAG) ×1 IMPLANT
STOPCOCK MORSE 400PSI 3WAY (MISCELLANEOUS) ×1 IMPLANT
SYRINGE MEDRAD AVANTA MACH 7 (SYRINGE) ×1 IMPLANT
TRANSDUCER W/STOPCOCK (MISCELLANEOUS) ×2 IMPLANT
TRAY PV CATH (CUSTOM PROCEDURE TRAY) ×2 IMPLANT
TUBING CIL FLEX 10 FLL-RA (TUBING) ×1 IMPLANT
WIRE BENTSON .035X145CM (WIRE) ×1 IMPLANT

## 2018-09-08 NOTE — Discharge Instructions (Signed)

## 2018-09-08 NOTE — Progress Notes (Addendum)
Site area: LFA Site Prior to Removal:  Level 0 Pressure Applied For:  Manual:   yes Patient Status During Pull:  stable Post Pull Site:  Level 0 Post Pull Instructions Given:  yes Post Pull Pulses Present: palpable Dressing Applied:  clear Bedrest begins @ 1030 till 1430 Comments:

## 2018-09-08 NOTE — Interval H&P Note (Signed)
History and Physical Interval Note:  09/08/2018 8:32 AM  Calvin Francis  has presented today for surgery, with the diagnosis of claudication  The various methods of treatment have been discussed with the patient and family. After consideration of risks, benefits and other options for treatment, the patient has consented to  Procedure(s): ABDOMINAL AORTOGRAM W/LOWER EXTREMITY (N/A) as a surgical intervention .  The patient's history has been reviewed, patient examined, no change in status, stable for surgery.  I have reviewed the patient's chart and labs.  Questions were answered to the patient's satisfaction.     Lorine Bears

## 2018-09-09 ENCOUNTER — Encounter (HOSPITAL_COMMUNITY): Payer: Self-pay | Admitting: Cardiovascular Disease

## 2018-09-10 ENCOUNTER — Telehealth: Payer: Self-pay

## 2018-09-10 ENCOUNTER — Other Ambulatory Visit: Payer: Self-pay

## 2018-09-10 MED ORDER — EZETIMIBE 10 MG PO TABS: 10.0000 mg | ORAL_TABLET | Freq: Every day | ORAL | 0 refills | Status: DC

## 2018-09-10 NOTE — Telephone Encounter (Signed)
Left a message for [name] to call back when he is able regarding the PREP.  I have indicated to him that the next group at the [facility] will begin on Oct. 16 and to let me know if he is still interested.

## 2018-09-10 NOTE — Telephone Encounter (Signed)
Spoke to [name] who is interested in the PREP but not until early in 2020 d/t upcoming surgeries.  We will speak closer to the end of 2019 to touch base and discuss upcoming programs.

## 2018-09-14 ENCOUNTER — Telehealth: Payer: Self-pay | Admitting: *Deleted

## 2018-09-14 NOTE — Telephone Encounter (Signed)
Left a message to call back concerning follow up appointment.

## 2018-09-15 ENCOUNTER — Other Ambulatory Visit: Payer: Self-pay | Admitting: *Deleted

## 2018-09-17 NOTE — Telephone Encounter (Signed)
Left a message to call back. The patient will need to follow up with Dr. Kirke CorinArida, post procedure. His follow up with Ward Givenshris Berge, NP will need to be canceled.

## 2018-09-23 NOTE — Telephone Encounter (Signed)
Attempted to call. Line keeps ringing busy.

## 2018-09-27 NOTE — Telephone Encounter (Signed)
Patient returning call about appt. Cancelled upcoming appt with berge.  Patient declines to schedule an ov because he needs a procedure but would like to schedule the vascular procedure at the end of October  or second week in November .

## 2018-09-27 NOTE — Telephone Encounter (Signed)
Spoke with the patient. He stated that he would like to go ahead and schedule his atherectomy of the right leg. He recently had an angiogram on 09/08/18 and has yet to have his post procedure office visit. He has been advised that he needs to have the office visit first but stated that he doesn't feel like this is necessary and would like to go ahead and schedule the atherectomy for October 23rd or 30th.   Patient will need to be scheduled at the [facility] office with Dr. Kirke Corin for further appointments since he lives in [place].

## 2018-09-27 NOTE — Telephone Encounter (Signed)
Left a message for the patient to call back.  

## 2018-09-28 ENCOUNTER — Other Ambulatory Visit: Payer: Self-pay | Admitting: Internal Medicine

## 2018-09-28 NOTE — Telephone Encounter (Signed)
Follow up appointment has been made for 10/12/18 at the [facility] office with Dr. Kirke Corin. The next steps will be discussed then.

## 2018-09-28 NOTE — Telephone Encounter (Signed)
Calvin Francis has no Entresto samples and reps are unable to get any at this time.Will forward message to Heart Failure clinic to see if they have any samples or if pt qualifies for assistance or needs to be on less expensive med .cy

## 2018-09-28 NOTE — Telephone Encounter (Signed)
Pt is calling to discuss his surgery, and appointment

## 2018-09-28 NOTE — Telephone Encounter (Signed)
.  Patient calling the office for samples of medication:   1.  What medication and dosage are you requesting samples for? entresol 49-51 he doubles 2.  Are you currently out of this medication? No about about week

## 2018-09-30 ENCOUNTER — Telehealth (HOSPITAL_COMMUNITY): Payer: Self-pay | Admitting: Pharmacist

## 2018-09-30 NOTE — Telephone Encounter (Signed)
PAN foundation open again. Have enrolled Calvin Francis so that he will have $1000 grant to use toward his copays for Entresto through 09/30/19. [facility] pharmacy aware and verified $0 copay.   Member ID: [id number] Group ID: [id number] RxBin ID: [id number] PCN: [id number] Eligibility Start Date: 07/02/2018 Eligibility End Date: 09/30/2019 Assistance Amount: $1,000.00  Cicero Duck K. Bonnye Fava, PharmD, BCPS, CPP Clinical Pharmacist Phone: [phone] 09/30/2018 9:39 AM

## 2018-09-30 NOTE — Telephone Encounter (Signed)
PAN foundation open again. Have enrolled Calvin Francis so that he will have $1000 grant to use toward his copays for Entresto through 09/30/19.   Member ID: [id number] Group ID: [id number] RxBin ID: [id number] PCN: [id number] Eligibility Start Date: 07/02/2018 Eligibility End Date: 09/30/2019 Assistance Amount: $1,000.00  Cicero Duck K. Bonnye Fava, PharmD, BCPS, CPP Clinical Pharmacist Phone: [phone] 09/30/2018 9:39 AM

## 2018-10-07 ENCOUNTER — Ambulatory Visit: Payer: Medicare HMO | Admitting: Nurse Practitioner

## 2018-10-12 ENCOUNTER — Other Ambulatory Visit: Payer: Self-pay | Admitting: *Deleted

## 2018-10-12 ENCOUNTER — Ambulatory Visit: Payer: Medicare HMO | Admitting: Cardiovascular Disease

## 2018-10-12 ENCOUNTER — Encounter: Payer: Self-pay | Admitting: Cardiovascular Disease

## 2018-10-12 VITALS — BP 101/69 | HR 81 | Ht 74.0 in | Wt 253.6 lb

## 2018-10-12 DIAGNOSIS — I5022 Chronic systolic (congestive) heart failure: Secondary | ICD-10-CM

## 2018-10-12 DIAGNOSIS — I739 Peripheral vascular disease, unspecified: Secondary | ICD-10-CM

## 2018-10-12 DIAGNOSIS — E785 Hyperlipidemia, unspecified: Secondary | ICD-10-CM

## 2018-10-12 DIAGNOSIS — I2510 Atherosclerotic heart disease of native coronary artery without angina pectoris: Secondary | ICD-10-CM

## 2018-10-12 DIAGNOSIS — Z01818 Encounter for other preprocedural examination: Secondary | ICD-10-CM

## 2018-10-12 DIAGNOSIS — I25708 Atherosclerosis of coronary artery bypass graft(s), unspecified, with other forms of angina pectoris: Secondary | ICD-10-CM | POA: Diagnosis not present

## 2018-10-12 DIAGNOSIS — Z79899 Other long term (current) drug therapy: Secondary | ICD-10-CM | POA: Diagnosis not present

## 2018-10-12 MED ORDER — RANOLAZINE ER 1000 MG PO TB12: 1000.0000 mg | ORAL_TABLET | Freq: Two times a day (BID) | ORAL | 3 refills | Status: AC

## 2018-10-12 NOTE — Patient Instructions (Signed)
Medication Instructions:  No changes If you need a refill on your cardiac medications before your next appointment, please call your pharmacy.   Lab work: Your provider would like for you to have the following labs today: CBC and BMET  If you have labs (blood work) drawn today and your tests are completely normal, you will receive your results only by: Marland Kitchen MyChart Message (if you have MyChart) OR . A paper copy in the mail If you have any lab test that is abnormal or we need to change your treatment, we will call you to review the results.  Follow-Up: Your physician recommends that you schedule a follow-up appointment in: one month with Dr. Kirke Corin. We will call you to set this up.    [place] MEDICAL GROUP [facility] CARDIOVASCULAR DIVISION [facility] [address] [place] [address] [place] [address] Dept: [phone] Loc: [phone]  DEIONDRE HARROWER  10/12/2018  You are scheduled for a Peripheral Angiogram on Wednesday, October 30 with Dr. Lorine Bears.  1. Please arrive at the [facility] (Main Entrance A) at [facility]: [address] [place], [place] [address] at 6:30 AM (This time is two hours before your procedure to ensure your preparation). Free valet parking service is available.   Special note: Every effort is made to have your procedure done on time. Please understand that emergencies sometimes delay scheduled procedures.  2. Diet: Do not eat solid foods after midnight.  The patient may have clear liquids until 5am upon the day of the procedure.  3. Labs: You will need to have blood drawn today at the [facility] office.  4. Medication instructions in preparation for your procedure: Please hold the Furosemide and Spironolactone the morning of the procedure.   On the morning of your procedure, take your Aspirin and Effient any morning medicines NOT listed above.  You may use sips of water.  5. Plan for one night stay--bring personal  belongings. 6. Bring a current list of your medications and current insurance cards. 7. You MUST have a responsible person to drive you home. 8. Someone MUST be with you the first 24 hours after you arrive home or your discharge will be delayed. 9. Please wear clothes that are easy to get on and off and wear slip-on shoes.  Thank you for allowing [place] to care for you!   -- [place] [facility]

## 2018-10-12 NOTE — Progress Notes (Signed)
Cardiology Office Note   Date:  10/12/2018   ID:  [name], DOB [birth year], MRN [id number]  PCP:  Ria Bush, MD  Cardiologist: Dr. Haroldine Laws  No chief complaint on file.     History of Present Illness: Calvin Francis is a 57 y.o. male who is here today for follow-up visit regarding peripheral arterial disease. The patient has extensive cardiac history including coronary artery disease status post CABG in 2505, chronic systolic heart failure due to ischemic cardiomyopathy, hypertension, obesity, hyperlipidemia and previous tobacco use.  He quit smoking last year.  Echocardiogram in July showed an EF of 20 to 25%.  The patient is being considered for advanced heart failure therapy including left ventricular assist device or cardiac transplantation.  He was seen recently for severe bilateral leg claudication worse on the right side. I proceeded with angiography which showed no significant aortoiliac disease.  On the right side, there was discrete calcified stenosis in the proximal to mid SFA with subtotal occlusion of the popliteal artery with three-vessel runoff below the knee.  On the left, there was heavy calcifications in the SFA with mild diffuse disease and severe proximal popliteal artery stenosis with three-vessel runoff below the knee.  The patient is here to discuss endovascular options.  He is still very limited by severe claudication.   Past Medical History:  Diagnosis Date  . 3-vessel CAD 02/22/2004   mid RCA 100% occluded, L-R collaterals; LAD 60-70% bifurcation lesion; Cx proximal 70% and mid 80% after OM1, OM1 100% occluded. --> CABG x 4  . Barrett's esophagus    s/p EGD several years ago with [facility]  . Bronchitis, mucopurulent recurrent ([place])   . CAD (coronary artery disease) of bypass graft 5/'12; 12/'13   Cath for angina and Abn Myoview ST: SVG-OM1 now occluded, attempt at PCI to the native circumflex OM1 unsuccessful; distal LAD beyond patent LIMA  70-80% (not PCI amenable); free radial-OM 2 patent; EF 45-50%  . Erectile dysfunction   . Former heavy cigarette smoker (20-39 per day) May 2013   . GERD (gastroesophageal reflux disease)    ?barrett's, with esophageal dysmotility, on omeprazole  . Glucose intolerance (impaired glucose tolerance)  2009   Prediabetes  . Gout   . History of Non-STEMI (non-ST elevated myocardial infarction) 02/22/2004   Three-vessel disease --> referred for CABG  . History of Non-STEMI (non-ST elevated myocardial infarction) December 2011   Hazy lesion in SVG-OM1 --> staged PCI with 4.0 mm x 20 mm vision BMS (4.5 mm)  . HLD (hyperlipidemia)    Statin intolerant  . Hypertension, benign   . Ischemic cardiomyopathy Echo 10/2012   EF ~40%; moderate Posterior HypoKinesis, minld-moderate inferior Hypokinesis; mild RV dilation, mild concentric LVH  . 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 Stable angina ([place]) 11/2012   Chronic,some what stable but still present; cardiac cath results above, no significant change from 2012  . Testosterone deficiency    On replacement; goal is low normal.  . Varicose veins December 2013   with venous reflux status VNUS ablation of left greater saphenous wein    Past Surgical History:  Procedure Laterality Date  . ABDOMINAL AORTOGRAM W/LOWER EXTREMITY N/A 09/08/2018   Procedure: ABDOMINAL AORTOGRAM W/LOWER EXTREMITY;  Surgeon: Wellington Hampshire, MD;  Location: [place] [facility];  Service: Cardiovascular;  Laterality: N/A;  . CARDIAC CATHETERIZATION  May 01 2011   knowwn occlusion of SVG  to RCA  as well as native RCA ; PCI to the SVG to OM1 ;patent LIMA to LAD with diffuse distal  LAD 70-80%,small  vessel dx not thought to be amenable to PCI .RCA 100% occluded ,OM2 patent,Circ diseased aftr OM1. EF 45-50%  . CORONARY ANGIOPLASTY WITH STENT PLACEMENT  12/25/2010   s/p autologous vessel blockage  . CORONARY ARTERY BYPASS GRAFT  02/27/04   LIMA -LAD,  Free Radial-OM2 -- patent; SVG-OM1, SVG-RCA, - 100% occluded by recent cath  . ESOPHAGOGASTRODUODENOSCOPY    . LEFT HEART CATHETERIZATION WITH CORONARY/GRAFT ANGIOGRAM N/A 12/27/2012   Procedure: LEFT HEART CATHETERIZATION WITH Beatrix Fetters;  Surgeon: Leonie Man, MD;  Location: [facility] CATH LAB: Known nRCA, mLAD, OM1 & OM2 100%, Known SVG-RCA 100%, SVG-OM1 100% ISR. Patent LIMA-dLAD w/ 60-80% dLAD. Patent frRAD-OM2. LCx-RPL collaterals.   EF ~35%  . lower venous extremity doppler Left 08/15/2011   normal ,s/p vein ablation  . MET/CPET  11/02/2012   suboptimal effort but peak VO2  was 52% which relatively significant. Myoview was suggestive for ant. ischemia may go along with his distal LAD   . NM MYOCAR PERF WALL MOTION  12/15/2012   high risk study  . RIGHT HEART CATH N/A 07/28/2018   Procedure: RIGHT HEART CATH;  Surgeon: Jolaine Artist, MD;  Location: [place] [facility];  Service: Cardiovascular;  Laterality: N/A;  . RIGHT/LEFT HEART CATH AND CORONARY/GRAFT ANGIOGRAPHY N/A 03/05/2018   Procedure: RIGHT/LEFT HEART CATH AND CORONARY/GRAFT ANGIOGRAPHY;  Surgeon: Belva Crome, MD;  Location: [place] [facility];  Service: Cardiovascular;  Laterality: N/A;  . TRANSTHORACIC ECHOCARDIOGRAM  10/2012   mild LVH, EF 40%, mod posterior and inf wall hypokinesis,mild concentric LVH;; RV dilated, normal  fx.trace MR  . VEIN SURGERY  2011   solomon - h/o vericose veins     Current Outpatient Medications  Medication Sig Dispense Refill  . ALPRAZolam (XANAX) 0.25 MG tablet TAKE 1 TABLET BY MOUTH TWICE DAILY AS NEEDED FOR ANXIETY 60 tablet 3  . amoxicillin (AMOXIL) 500 MG tablet FOR 10 DAYS    . APPLE CIDER VINEGAR PO Take 480 mg by mouth every morning.    Marland Kitchen aspirin 81 MG chewable tablet Chew 81 mg by mouth daily.    . carvedilol (COREG) 3.125 MG tablet Take 1 tablet (3.125 mg total) by mouth 2 (two) times daily with a meal. 180 tablet 6  . co-enzyme Q-10 30 MG capsule Take 90 mg by mouth  daily.     Marland Kitchen COLCRYS 0.6 MG tablet TAKE AS DIRECTED 2 TABLETS BY MOUTH ON FIRST DAY THEN ONE DAILY AS NEEDED 30 tablet 3  . ezetimibe (ZETIA) 10 MG tablet Take 1 tablet (10 mg total) by mouth daily. 90 tablet 0  . fluorouracil (EFUDEX) 5 % cream Apply topically 2 (two) times daily. (Patient taking differently: Apply 1 application topically 2 (two) times daily as needed (excema flare up). ) 40 g 1  . fluticasone (FLONASE) 50 MCG/ACT nasal spray Place 2 sprays into both nostrils daily as needed for allergies or rhinitis.    . furosemide (LASIX) 20 MG tablet Take 1 tablet (20 mg total) by mouth daily. 90 tablet 3  . nitroGLYCERIN (NITROSTAT) 0.4 MG SL tablet PLACE 1 TABLET UNDER THE TONGUE EVERY 5 MINS AS NEEDED FOR CHEST PAIN 25 tablet 3  . omeprazole (PRILOSEC OTC) 20 MG tablet Take 40 mg by mouth daily.     . prasugrel (EFFIENT) 10 MG TABS tablet Take 1  tablet (10 mg total) by mouth daily. 90 tablet 3  . pravastatin (PRAVACHOL) 40 MG tablet Take 20 mg by mouth every other day.    . ranolazine (RANEXA) 1000 MG SR tablet Take 1 tablet (1,000 mg total) by mouth twice daily, as directed. (Patient taking differently: Take 1,000 mg by mouth 2 (two) times daily. ) 180 tablet 3  . sacubitril-valsartan (ENTRESTO) 49-51 MG Take 1 tablet by mouth 2 (two) times daily. 60 tablet 3  . spironolactone (ALDACTONE) 25 MG tablet Take 0.5 tablets (12.5 mg total) by mouth daily. 45 tablet 3  . vitamin B-12 (CYANOCOBALAMIN) 1000 MCG tablet Take 1,000 mcg by mouth daily.      No current facility-administered medications for this visit.     Allergies:   Statins; Zetia [ezetimibe]; Clopidogrel; and Codeine    Social History:  The patient  reports that he quit smoking about 6 years ago. His smoking use included cigarettes. He has a 10.00 pack-year smoking history. He has never used smokeless tobacco. He reports that he drinks alcohol. He reports that he does not use drugs.   Family History:  The patient's family  history includes Coronary artery disease in his paternal uncle; Diabetes in his paternal aunt; Stroke in his maternal uncle.    ROS:  Please see the history of present illness.   Otherwise, review of systems are positive for none.   All other systems are reviewed and negative.    PHYSICAL EXAM: VS:  BP 101/69   Pulse 81   Ht _0  (1.88 m)   Wt 253 lb 9.6 oz (115 kg)   BMI 32.56 kg/m  , BMI Body mass index is 32.56 kg/m. GEN: Well nourished, well developed, in no acute distress  HEENT: normal  Neck: no JVD, carotid bruits, or masses Cardiac: RRR; no murmurs, rubs, or gallops,no edema  Respiratory:  clear to auscultation bilaterally, normal work of breathing GI: soft, nontender, nondistended, + BS MS: no deformity or atrophy  Skin: warm and dry, no rash Neuro:  Strength and sensation are intact Psych: euthymic mood, full affect Vascular: Femoral pulses normal bilaterally.  Posterior tibial is not palpable on the right side and +2 on the left side.  Dorsalis pedis: Not palpable on the right side and +1 on the left side.  Right radial artery is harvested for previous CABG.  Left radial pulses normal.  EKG:  EKG is not ordered today.    Recent Labs: 03/07/2018: Magnesium 1.9 07/26/2018: ALT 26; B Natriuretic Peptide 180.7 09/03/2018: BUN 13; Creatinine, Ser 1.20; Hemoglobin 13.3; Platelets 202; Potassium 4.6; Sodium 138    Lipid Panel    Component Value Date/Time   CHOL 174 03/03/2018 0215   TRIG 151 (H) 03/03/2018 0215   HDL 33 (L) 03/03/2018 0215   CHOLHDL 5.3 03/03/2018 0215   VLDL 30 03/03/2018 0215   LDLCALC 111 (H) 03/03/2018 0215   LDLCALC 122 (H) 10/13/2017 0835   LDLDIRECT 158.0 09/29/2016 1008      Wt Readings from Last 3 Encounters:  10/12/18 253 lb 9.6 oz (115 kg)  09/08/18 252 lb (114.3 kg)  09/03/18 252 lb (114.3 kg)       PAD Screen 09/03/2018  Previous PAD dx? No  Previous surgical procedure? No  Pain with walking? Yes  Subsides with rest? Yes    Feet/toe relief with dangling? No  Painful, non-healing ulcers? No  Extremities discolored? No      ASSESSMENT AND PLAN:  1.  Severe bilateral  calf claudication worse on the right side due to SFA popliteal disease: I discussed management options of claudication.  His symptoms are clearly lifestyle limiting and thus I recommend endovascular intervention.  Due to heavy calcifications, I plan in using orbital atherectomy.  I discussed the procedure in details as well as risks and benefits. He is already on dual antiplatelet therapy with aspirin and Prasugrel.  2.  Chronic systolic heart failure: He appears to be euvolemic and currently on optimal medical therapy.  3.  Coronary artery disease involving native coronary arteries: No angina at the present time.  4.  Hyperlipidemia: Intolerance to most statins but seems to be tolerating pravastatin.    Disposition:   FU with me in 1 month  Signed,  Kathlyn Sacramento, MD  10/12/2018 11:11 AM    [place]

## 2018-10-12 NOTE — Progress Notes (Signed)
PATIENT REQUEST PRESCRIPTION TO MAIL TO MAIL ORDER

## 2018-10-12 NOTE — H&P (View-Only) (Signed)
Cardiology Office Note   Date:  10/12/2018   ID:  Calvin Francis, DOB [birth year], MRN [id number]  PCP:  Ria Bush, MD  Cardiologist: Dr. Haroldine Laws  No chief complaint on file.     History of Present Illness: Calvin Francis is a 57 y.o. male who is here today for follow-up visit regarding peripheral arterial disease. The patient has extensive cardiac history including coronary artery disease status post CABG in 2505, chronic systolic heart failure due to ischemic cardiomyopathy, hypertension, obesity, hyperlipidemia and previous tobacco use.  He quit smoking last year.  Echocardiogram in July showed an EF of 20 to 25%.  The patient is being considered for advanced heart failure therapy including left ventricular assist device or cardiac transplantation.  He was seen recently for severe bilateral leg claudication worse on the right side. I proceeded with angiography which showed no significant aortoiliac disease.  On the right side, there was discrete calcified stenosis in the proximal to mid SFA with subtotal occlusion of the popliteal artery with three-vessel runoff below the knee.  On the left, there was heavy calcifications in the SFA with mild diffuse disease and severe proximal popliteal artery stenosis with three-vessel runoff below the knee.  The patient is here to discuss endovascular options.  He is still very limited by severe claudication.   Past Medical History:  Diagnosis Date  . 3-vessel CAD 02/22/2004   mid RCA 100% occluded, L-R collaterals; LAD 60-70% bifurcation lesion; Cx proximal 70% and mid 80% after OM1, OM1 100% occluded. --> CABG x 4  . Barrett's esophagus    s/p EGD several years ago with [place]  . Bronchitis, mucopurulent recurrent ([place])   . CAD (coronary artery disease) of bypass graft 5/'12; 12/'13   Cath for angina and Abn Myoview ST: SVG-OM1 now occluded, attempt at PCI to the native circumflex OM1 unsuccessful; distal LAD beyond patent LIMA  70-80% (not PCI amenable); free radial-OM 2 patent; EF 45-50%  . Erectile dysfunction   . Former heavy cigarette smoker (20-39 per day) May 2013   . GERD (gastroesophageal reflux disease)    ?barrett's, with esophageal dysmotility, on omeprazole  . Glucose intolerance (impaired glucose tolerance)  2009   Prediabetes  . Gout   . History of Non-STEMI (non-ST elevated myocardial infarction) 02/22/2004   Three-vessel disease --> referred for CABG  . History of Non-STEMI (non-ST elevated myocardial infarction) December 2011   Hazy lesion in SVG-OM1 --> staged PCI with 4.0 mm x 20 mm vision BMS (4.5 mm)  . HLD (hyperlipidemia)    Statin intolerant  . Hypertension, benign   . Ischemic cardiomyopathy Echo 10/2012   EF ~40%; moderate Posterior HypoKinesis, minld-moderate inferior Hypokinesis; mild RV dilation, mild concentric LVH  . 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 Stable angina ([place]) 11/2012   Chronic,some what stable but still present; cardiac cath results above, no significant change from 2012  . Testosterone deficiency    On replacement; goal is low normal.  . Varicose veins December 2013   with venous reflux status VNUS ablation of left greater saphenous wein    Past Surgical History:  Procedure Laterality Date  . ABDOMINAL AORTOGRAM W/LOWER EXTREMITY N/A 09/08/2018   Procedure: ABDOMINAL AORTOGRAM W/LOWER EXTREMITY;  Surgeon: Wellington Hampshire, MD;  Location: [place] [facility];  Service: Cardiovascular;  Laterality: N/A;  . CARDIAC CATHETERIZATION  May 01 2011   knowwn occlusion of SVG  to RCA  as well as native RCA ; PCI to the SVG to OM1 ;patent LIMA to LAD with diffuse distal  LAD 70-80%,small  vessel dx not thought to be amenable to PCI .RCA 100% occluded ,OM2 patent,Circ diseased aftr OM1. EF 45-50%  . CORONARY ANGIOPLASTY WITH STENT PLACEMENT  12/25/2010   s/p autologous vessel blockage  . CORONARY ARTERY BYPASS GRAFT  02/27/04   LIMA -LAD,  Free Radial-OM2 -- patent; SVG-OM1, SVG-RCA, - 100% occluded by recent cath  . ESOPHAGOGASTRODUODENOSCOPY    . LEFT HEART CATHETERIZATION WITH CORONARY/GRAFT ANGIOGRAM N/A 12/27/2012   Procedure: LEFT HEART CATHETERIZATION WITH Beatrix Fetters;  Surgeon: Leonie Man, MD;  Location: [facility] CATH LAB: Known nRCA, mLAD, OM1 & OM2 100%, Known SVG-RCA 100%, SVG-OM1 100% ISR. Patent LIMA-dLAD w/ 60-80% dLAD. Patent frRAD-OM2. LCx-RPL collaterals.   EF ~35%  . lower venous extremity doppler Left 08/15/2011   normal ,s/p vein ablation  . MET/CPET  11/02/2012   suboptimal effort but peak VO2  was 52% which relatively significant. Myoview was suggestive for ant. ischemia may go along with his distal LAD   . NM MYOCAR PERF WALL MOTION  12/15/2012   high risk study  . RIGHT HEART CATH N/A 07/28/2018   Procedure: RIGHT HEART CATH;  Surgeon: Jolaine Artist, MD;  Location: [place] [facility];  Service: Cardiovascular;  Laterality: N/A;  . RIGHT/LEFT HEART CATH AND CORONARY/GRAFT ANGIOGRAPHY N/A 03/05/2018   Procedure: RIGHT/LEFT HEART CATH AND CORONARY/GRAFT ANGIOGRAPHY;  Surgeon: Belva Crome, MD;  Location: [place] [facility];  Service: Cardiovascular;  Laterality: N/A;  . TRANSTHORACIC ECHOCARDIOGRAM  10/2012   mild LVH, EF 40%, mod posterior and inf wall hypokinesis,mild concentric LVH;; RV dilated, normal  fx.trace MR  . VEIN SURGERY  2011   solomon - h/o vericose veins     Current Outpatient Medications  Medication Sig Dispense Refill  . ALPRAZolam (XANAX) 0.25 MG tablet TAKE 1 TABLET BY MOUTH TWICE DAILY AS NEEDED FOR ANXIETY 60 tablet 3  . amoxicillin (AMOXIL) 500 MG tablet FOR 10 DAYS    . APPLE CIDER VINEGAR PO Take 480 mg by mouth every morning.    Marland Kitchen aspirin 81 MG chewable tablet Chew 81 mg by mouth daily.    . carvedilol (COREG) 3.125 MG tablet Take 1 tablet (3.125 mg total) by mouth 2 (two) times daily with a meal. 180 tablet 6  . co-enzyme Q-10 30 MG capsule Take 90 mg by mouth  daily.     Marland Kitchen COLCRYS 0.6 MG tablet TAKE AS DIRECTED 2 TABLETS BY MOUTH ON FIRST DAY THEN ONE DAILY AS NEEDED 30 tablet 3  . ezetimibe (ZETIA) 10 MG tablet Take 1 tablet (10 mg total) by mouth daily. 90 tablet 0  . fluorouracil (EFUDEX) 5 % cream Apply topically 2 (two) times daily. (Patient taking differently: Apply 1 application topically 2 (two) times daily as needed (excema flare up). ) 40 g 1  . fluticasone (FLONASE) 50 MCG/ACT nasal spray Place 2 sprays into both nostrils daily as needed for allergies or rhinitis.    . furosemide (LASIX) 20 MG tablet Take 1 tablet (20 mg total) by mouth daily. 90 tablet 3  . nitroGLYCERIN (NITROSTAT) 0.4 MG SL tablet PLACE 1 TABLET UNDER THE TONGUE EVERY 5 MINS AS NEEDED FOR CHEST PAIN 25 tablet 3  . omeprazole (PRILOSEC OTC) 20 MG tablet Take 40 mg by mouth daily.     . prasugrel (EFFIENT) 10 MG TABS tablet Take 1  tablet (10 mg total) by mouth daily. 90 tablet 3  . pravastatin (PRAVACHOL) 40 MG tablet Take 20 mg by mouth every other day.    . ranolazine (RANEXA) 1000 MG SR tablet Take 1 tablet (1,000 mg total) by mouth twice daily, as directed. (Patient taking differently: Take 1,000 mg by mouth 2 (two) times daily. ) 180 tablet 3  . sacubitril-valsartan (ENTRESTO) 49-51 MG Take 1 tablet by mouth 2 (two) times daily. 60 tablet 3  . spironolactone (ALDACTONE) 25 MG tablet Take 0.5 tablets (12.5 mg total) by mouth daily. 45 tablet 3  . vitamin B-12 (CYANOCOBALAMIN) 1000 MCG tablet Take 1,000 mcg by mouth daily.      No current facility-administered medications for this visit.     Allergies:   Statins; Zetia [ezetimibe]; Clopidogrel; and Codeine    Social History:  The patient  reports that he quit smoking about 6 years ago. His smoking use included cigarettes. He has a 10.00 pack-year smoking history. He has never used smokeless tobacco. He reports that he drinks alcohol. He reports that he does not use drugs.   Family History:  The patient's family  history includes Coronary artery disease in his paternal uncle; Diabetes in his paternal aunt; Stroke in his maternal uncle.    ROS:  Please see the history of present illness.   Otherwise, review of systems are positive for none.   All other systems are reviewed and negative.    PHYSICAL EXAM: VS:  BP 101/69   Pulse 81   Ht _0  (1.88 m)   Wt 253 lb 9.6 oz (115 kg)   BMI 32.56 kg/m  , BMI Body mass index is 32.56 kg/m. GEN: Well nourished, well developed, in no acute distress  HEENT: normal  Neck: no JVD, carotid bruits, or masses Cardiac: RRR; no murmurs, rubs, or gallops,no edema  Respiratory:  clear to auscultation bilaterally, normal work of breathing GI: soft, nontender, nondistended, + BS MS: no deformity or atrophy  Skin: warm and dry, no rash Neuro:  Strength and sensation are intact Psych: euthymic mood, full affect Vascular: Femoral pulses normal bilaterally.  Posterior tibial is not palpable on the right side and +2 on the left side.  Dorsalis pedis: Not palpable on the right side and +1 on the left side.  Right radial artery is harvested for previous CABG.  Left radial pulses normal.  EKG:  EKG is not ordered today.    Recent Labs: 03/07/2018: Magnesium 1.9 07/26/2018: ALT 26; B Natriuretic Peptide 180.7 09/03/2018: BUN 13; Creatinine, Ser 1.20; Hemoglobin 13.3; Platelets 202; Potassium 4.6; Sodium 138    Lipid Panel    Component Value Date/Time   CHOL 174 03/03/2018 0215   TRIG 151 (H) 03/03/2018 0215   HDL 33 (L) 03/03/2018 0215   CHOLHDL 5.3 03/03/2018 0215   VLDL 30 03/03/2018 0215   LDLCALC 111 (H) 03/03/2018 0215   LDLCALC 122 (H) 10/13/2017 0835   LDLDIRECT 158.0 09/29/2016 1008      Wt Readings from Last 3 Encounters:  10/12/18 253 lb 9.6 oz (115 kg)  09/08/18 252 lb (114.3 kg)  09/03/18 252 lb (114.3 kg)       PAD Screen 09/03/2018  Previous PAD dx? No  Previous surgical procedure? No  Pain with walking? Yes  Subsides with rest? Yes    Feet/toe relief with dangling? No  Painful, non-healing ulcers? No  Extremities discolored? No      ASSESSMENT AND PLAN:  1.  Severe bilateral  calf claudication worse on the right side due to SFA popliteal disease: I discussed management options of claudication.  His symptoms are clearly lifestyle limiting and thus I recommend endovascular intervention.  Due to heavy calcifications, I plan in using orbital atherectomy.  I discussed the procedure in details as well as risks and benefits. He is already on dual antiplatelet therapy with aspirin and Prasugrel.  2.  Chronic systolic heart failure: He appears to be euvolemic and currently on optimal medical therapy.  3.  Coronary artery disease involving native coronary arteries: No angina at the present time.  4.  Hyperlipidemia: Intolerance to most statins but seems to be tolerating pravastatin.    Disposition:   FU with me in 1 month  Signed,  Kathlyn Sacramento, MD  10/12/2018 11:11 AM    [place]

## 2018-10-13 LAB — LIPID PANEL
CHOL/HDL RATIO: 4.8 ratio (ref 0.0–5.0)
Cholesterol, Total: 210 mg/dL — ABNORMAL HIGH (ref 100–199)
HDL: 44 mg/dL (ref >39)
LDL Calculated: 112 mg/dL — ABNORMAL HIGH (ref 0–99)
TRIGLYCERIDES: 269 mg/dL — AB (ref 0–149)
VLDL Cholesterol Cal: 54 mg/dL — ABNORMAL HIGH (ref 5–40)

## 2018-10-13 LAB — CBC
Hematocrit: 39.4 % (ref 37.5–51.0)
Hemoglobin: 13.1 g/dL (ref 13.0–17.7)
MCH: 28.9 pg (ref 26.6–33.0)
MCHC: 33.2 g/dL (ref 31.5–35.7)
MCV: 87 fL (ref 79–97)
PLATELETS: 213 10*3/uL (ref 150–450)
RBC: 4.53 x10E6/uL (ref 4.14–5.80)
RDW: 13.9 % (ref 12.3–15.4)
WBC: 5.1 10*3/uL (ref 3.4–10.8)

## 2018-10-13 LAB — COMPREHENSIVE METABOLIC PANEL
ALT: 25 IU/L (ref 0–44)
AST: 23 IU/L (ref 0–40)
Albumin/Globulin Ratio: 2.2 (ref 1.2–2.2)
Albumin: 4.7 g/dL (ref 3.5–5.5)
Alkaline Phosphatase: 54 IU/L (ref 39–117)
BUN / CREAT RATIO: 8 — AB (ref 9–20)
BUN: 10 mg/dL (ref 6–24)
Bilirubin Total: 0.6 mg/dL (ref 0.0–1.2)
CALCIUM: 9.4 mg/dL (ref 8.7–10.2)
CO2: 22 mmol/L (ref 20–29)
Chloride: 98 mmol/L (ref 96–106)
Creatinine, Ser: 1.19 mg/dL (ref 0.76–1.27)
GFR, EST AFRICAN AMERICAN: 78 mL/min/{1.73_m2} (ref >59)
GFR, EST NON AFRICAN AMERICAN: 68 mL/min/{1.73_m2} (ref >59)
GLOBULIN, TOTAL: 2.1 g/dL (ref 1.5–4.5)
Glucose: 92 mg/dL (ref 65–99)
POTASSIUM: 4.6 mmol/L (ref 3.5–5.2)
Sodium: 136 mmol/L (ref 134–144)
Total Protein: 6.8 g/dL (ref 6.0–8.5)

## 2018-10-13 LAB — BASIC METABOLIC PANEL
BUN/Creatinine Ratio: 8 — ABNORMAL LOW (ref 9–20)
BUN: 10 mg/dL (ref 6–24)
CO2: 21 mmol/L (ref 20–29)
Calcium: 9.4 mg/dL (ref 8.7–10.2)
Chloride: 100 mmol/L (ref 96–106)
Creatinine, Ser: 1.19 mg/dL (ref 0.76–1.27)
GFR calc Af Amer: 78 mL/min/{1.73_m2} (ref >59)
GFR calc non Af Amer: 68 mL/min/{1.73_m2} (ref >59)
GLUCOSE: 93 mg/dL (ref 65–99)
POTASSIUM: 4.6 mmol/L (ref 3.5–5.2)
SODIUM: 138 mmol/L (ref 134–144)

## 2018-10-18 DIAGNOSIS — I739 Peripheral vascular disease, unspecified: Secondary | ICD-10-CM | POA: Diagnosis not present

## 2018-10-18 DIAGNOSIS — I5022 Chronic systolic (congestive) heart failure: Secondary | ICD-10-CM | POA: Diagnosis not present

## 2018-10-21 ENCOUNTER — Encounter (HOSPITAL_COMMUNITY): Payer: Medicare HMO | Admitting: Internal Medicine

## 2018-10-25 ENCOUNTER — Telehealth: Payer: Self-pay | Admitting: *Deleted

## 2018-10-25 NOTE — Telephone Encounter (Signed)
Pt contacted pre-PV procedure scheduled at [facility] for: Wednesday October 27, 2018 8:30 AM Verified arrival time and place: [facility] Main Entrance A at: 6:30 AM  No solid food after midnight prior to cath, clear liquids until 5 AM day of procedure. Contrast allergy: no  Verified no diabetes medications.  Hold: Furosemide-AM of procedure. Spironolactone -AM of procedure.  Except hold medications AM meds can be  taken pre-cath with sip of water including: ASA 81 mg Effient 10 mg   Confirmed patient has responsible person to drive home post procedure and for 24 hours after you arrive home: yes

## 2018-10-27 ENCOUNTER — Encounter (HOSPITAL_COMMUNITY)
Admission: RE | Disposition: A | Discharge status: Home / Self Care | Payer: Self-pay | Source: Ambulatory Visit | Attending: Cardiovascular Disease

## 2018-10-27 ENCOUNTER — Ambulatory Visit (HOSPITAL_COMMUNITY)
Admission: RE | Admit: 2018-10-27 | Discharge: 2018-10-27 | Disposition: A | Discharge status: Home / Self Care | Payer: Medicare HMO | Source: Ambulatory Visit | Attending: Cardiovascular Disease | Admitting: Cardiovascular Disease

## 2018-10-27 ENCOUNTER — Encounter (HOSPITAL_COMMUNITY): Payer: Self-pay | Admitting: Cardiovascular Disease

## 2018-10-27 ENCOUNTER — Other Ambulatory Visit: Payer: Self-pay

## 2018-10-27 DIAGNOSIS — Z955 Presence of coronary angioplasty implant and graft: Secondary | ICD-10-CM | POA: Diagnosis not present

## 2018-10-27 DIAGNOSIS — I5022 Chronic systolic (congestive) heart failure: Secondary | ICD-10-CM | POA: Insufficient documentation

## 2018-10-27 DIAGNOSIS — Z87891 Personal history of nicotine dependence: Secondary | ICD-10-CM | POA: Diagnosis not present

## 2018-10-27 DIAGNOSIS — R7303 Prediabetes: Secondary | ICD-10-CM | POA: Insufficient documentation

## 2018-10-27 DIAGNOSIS — I255 Ischemic cardiomyopathy: Secondary | ICD-10-CM | POA: Diagnosis not present

## 2018-10-27 DIAGNOSIS — Z9889 Other specified postprocedural states: Secondary | ICD-10-CM | POA: Diagnosis not present

## 2018-10-27 DIAGNOSIS — Z8249 Family history of ischemic heart disease and other diseases of the circulatory system: Secondary | ICD-10-CM | POA: Diagnosis not present

## 2018-10-27 DIAGNOSIS — E669 Obesity, unspecified: Secondary | ICD-10-CM | POA: Insufficient documentation

## 2018-10-27 DIAGNOSIS — Z7982 Long term (current) use of aspirin: Secondary | ICD-10-CM | POA: Insufficient documentation

## 2018-10-27 DIAGNOSIS — I2510 Atherosclerotic heart disease of native coronary artery without angina pectoris: Secondary | ICD-10-CM | POA: Insufficient documentation

## 2018-10-27 DIAGNOSIS — Z823 Family history of stroke: Secondary | ICD-10-CM | POA: Insufficient documentation

## 2018-10-27 DIAGNOSIS — I739 Peripheral vascular disease, unspecified: Secondary | ICD-10-CM

## 2018-10-27 DIAGNOSIS — Z79899 Other long term (current) drug therapy: Secondary | ICD-10-CM | POA: Insufficient documentation

## 2018-10-27 DIAGNOSIS — K2270 Barrett's esophagus without dysplasia: Secondary | ICD-10-CM | POA: Insufficient documentation

## 2018-10-27 DIAGNOSIS — E785 Hyperlipidemia, unspecified: Secondary | ICD-10-CM | POA: Insufficient documentation

## 2018-10-27 DIAGNOSIS — I252 Old myocardial infarction: Secondary | ICD-10-CM | POA: Diagnosis not present

## 2018-10-27 DIAGNOSIS — Z951 Presence of aortocoronary bypass graft: Secondary | ICD-10-CM | POA: Insufficient documentation

## 2018-10-27 DIAGNOSIS — M109 Gout, unspecified: Secondary | ICD-10-CM | POA: Insufficient documentation

## 2018-10-27 DIAGNOSIS — K219 Gastro-esophageal reflux disease without esophagitis: Secondary | ICD-10-CM | POA: Insufficient documentation

## 2018-10-27 DIAGNOSIS — Z888 Allergy status to other drugs, medicaments and biological substances status: Secondary | ICD-10-CM | POA: Insufficient documentation

## 2018-10-27 DIAGNOSIS — I70211 Atherosclerosis of native arteries of extremities with intermittent claudication, right leg: Secondary | ICD-10-CM | POA: Insufficient documentation

## 2018-10-27 DIAGNOSIS — Z833 Family history of diabetes mellitus: Secondary | ICD-10-CM | POA: Insufficient documentation

## 2018-10-27 DIAGNOSIS — Z885 Allergy status to narcotic agent status: Secondary | ICD-10-CM | POA: Diagnosis not present

## 2018-10-27 DIAGNOSIS — Z7902 Long term (current) use of antithrombotics/antiplatelets: Secondary | ICD-10-CM | POA: Diagnosis not present

## 2018-10-27 DIAGNOSIS — I110 Hypertensive heart disease with heart failure: Secondary | ICD-10-CM | POA: Diagnosis not present

## 2018-10-27 HISTORY — PX: PERIPHERAL VASCULAR ATHERECTOMY: CATH118256

## 2018-10-27 LAB — POCT ACTIVATED CLOTTING TIME
ACTIVATED CLOTTING TIME: 241 s
Activated Clotting Time: 230 seconds

## 2018-10-27 SURGERY — PERIPHERAL VASCULAR ATHERECTOMY
Anesthesia: LOCAL | Laterality: Right

## 2018-10-27 MED ORDER — ONDANSETRON HCL 4 MG/2ML IJ SOLN: 4.0000 mg | Freq: Four times a day (QID) | INTRAMUSCULAR | Status: DC | PRN

## 2018-10-27 MED ORDER — HEPARIN (PORCINE) IN NACL 1000-0.9 UT/500ML-% IV SOLN
INTRAVENOUS | Status: DC | PRN
  Administered 2018-10-27 (×2): 500 mL

## 2018-10-27 MED ORDER — FENTANYL CITRATE (PF) 100 MCG/2ML IJ SOLN
INTRAMUSCULAR | Status: AC
  Filled 2018-10-27: qty 2

## 2018-10-27 MED ORDER — VERAPAMIL HCL 2.5 MG/ML IV SOLN
INTRAVENOUS | Status: AC
  Filled 2018-10-27: qty 2

## 2018-10-27 MED ORDER — HEPARIN (PORCINE) IN NACL 1000-0.9 UT/500ML-% IV SOLN
INTRAVENOUS | Status: AC
  Filled 2018-10-27: qty 1000

## 2018-10-27 MED ORDER — SODIUM CHLORIDE 0.9 % IV SOLN: 250.0000 mL | INTRAVENOUS | Status: DC | PRN

## 2018-10-27 MED ORDER — SODIUM CHLORIDE 0.9% FLUSH: 3.0000 mL | Freq: Two times a day (BID) | INTRAVENOUS | Status: DC

## 2018-10-27 MED ORDER — FENTANYL CITRATE (PF) 100 MCG/2ML IJ SOLN
INTRAMUSCULAR | Status: DC | PRN
  Administered 2018-10-27 (×3): 50 ug via INTRAVENOUS

## 2018-10-27 MED ORDER — ASPIRIN 81 MG PO CHEW: 81.0000 mg | CHEWABLE_TABLET | ORAL | Status: DC

## 2018-10-27 MED ORDER — ACETAMINOPHEN 325 MG PO TABS: 650.0000 mg | ORAL_TABLET | ORAL | Status: DC | PRN

## 2018-10-27 MED ORDER — NITROGLYCERIN 1 MG/10 ML FOR IR/CATH LAB
INTRA_ARTERIAL | Status: DC | PRN
  Administered 2018-10-27 (×3): 200 ug via INTRA_ARTERIAL

## 2018-10-27 MED ORDER — HEPARIN SODIUM (PORCINE) 1000 UNIT/ML IJ SOLN
INTRAMUSCULAR | Status: DC | PRN
  Administered 2018-10-27: 10000 [IU] via INTRAVENOUS
  Administered 2018-10-27: 3000 [IU] via INTRAVENOUS
  Administered 2018-10-27: 2000 [IU] via INTRAVENOUS

## 2018-10-27 MED ORDER — MIDAZOLAM HCL 2 MG/2ML IJ SOLN
INTRAMUSCULAR | Status: DC | PRN
  Administered 2018-10-27 (×3): 1 mg via INTRAVENOUS

## 2018-10-27 MED ORDER — IODIXANOL 320 MG/ML IV SOLN
INTRAVENOUS | Status: DC | PRN
  Administered 2018-10-27: 80 mL via INTRA_ARTERIAL

## 2018-10-27 MED ORDER — SODIUM CHLORIDE 0.9 % IV SOLN: INTRAVENOUS | Status: DC

## 2018-10-27 MED ORDER — SODIUM CHLORIDE 0.9 % IV SOLN
INTRAVENOUS | Status: DC
  Administered 2018-10-27: 07:00:00 via INTRAVENOUS

## 2018-10-27 MED ORDER — MIDAZOLAM HCL 2 MG/2ML IJ SOLN
INTRAMUSCULAR | Status: AC
  Filled 2018-10-27: qty 2

## 2018-10-27 MED ORDER — SODIUM CHLORIDE 0.9% FLUSH: 3.0000 mL | INTRAVENOUS | Status: DC | PRN

## 2018-10-27 MED ORDER — LIDOCAINE HCL (PF) 1 % IJ SOLN
INTRAMUSCULAR | Status: AC
  Filled 2018-10-27: qty 30

## 2018-10-27 MED ORDER — LIDOCAINE HCL (PF) 1 % IJ SOLN
INTRAMUSCULAR | Status: DC | PRN
  Administered 2018-10-27: 20 mL

## 2018-10-27 MED ORDER — NITROGLYCERIN IN D5W 200-5 MCG/ML-% IV SOLN
INTRAVENOUS | Status: AC
  Filled 2018-10-27: qty 250

## 2018-10-27 MED ORDER — HEPARIN SODIUM (PORCINE) 1000 UNIT/ML IJ SOLN
INTRAMUSCULAR | Status: AC
  Filled 2018-10-27: qty 1

## 2018-10-27 MED ORDER — VIPERSLIDE LUBRICANT OPTIME
TOPICAL | Status: DC | PRN
  Administered 2018-10-27: 09:00:00 via SURGICAL_CAVITY

## 2018-10-27 SURGICAL SUPPLY — 31 items
BALLN ADMIRAL INPACT 6X250 (BALLOONS) ×2
BALLN IN.PACT DCB 6X40 (BALLOONS) ×2
BALLN STERLING OTW 5X220X150 (BALLOONS) ×2
BALLOON ADMIRAL INPACT 6X250 (BALLOONS) IMPLANT
BALLOON STERLING OTW 5X220X150 (BALLOONS) IMPLANT
CATH ANGIO 5F PIGTAIL 65CM (CATHETERS) IMPLANT
CATH CROSS OVER TEMPO 5F (CATHETERS) ×1 IMPLANT
CATH QUICKCROSS .018X135CM (MICROCATHETER) ×1 IMPLANT
DCB IN.PACT 6X40 (BALLOONS) IMPLANT
DEVICE EMBOSHIELD NAV6 4.0-7.0 (FILTER) ×1 IMPLANT
DIAMONDBACK SOLID OAS 2.0MM (CATHETERS) ×2
KIT ENCORE 26 ADVANTAGE (KITS) ×1 IMPLANT
KIT MICROPUNCTURE NIT STIFF (SHEATH) ×1 IMPLANT
KIT PV (KITS) ×2 IMPLANT
LUBRICANT VIPERSLIDE CORONARY (MISCELLANEOUS) ×1 IMPLANT
SHEATH PINNACLE 5F 10CM (SHEATH) ×1 IMPLANT
SHEATH PINNACLE 6F 10CM (SHEATH) ×1 IMPLANT
SHEATH PINNACLE ST 6F 45CM (SHEATH) ×1 IMPLANT
SHIELD RADPAD SCOOP 12X17 (MISCELLANEOUS) ×1 IMPLANT
STOPCOCK MORSE 400PSI 3WAY (MISCELLANEOUS) ×1 IMPLANT
SYRINGE MEDRAD AVANTA MACH 7 (SYRINGE) ×1 IMPLANT
SYSTEM DIMNDBCK SLD OAS 2.0MM (CATHETERS) IMPLANT
TAPE VIPERTRACK RADIOPAQ (MISCELLANEOUS) IMPLANT
TAPE VIPERTRACK RADIOPAQUE (MISCELLANEOUS) ×2
TRANSDUCER W/STOPCOCK (MISCELLANEOUS) ×2 IMPLANT
TRAY PV CATH (CUSTOM PROCEDURE TRAY) ×2 IMPLANT
TUBING CIL FLEX 10 FLL-RA (TUBING) ×1 IMPLANT
WIRE HITORQ VERSACORE ST 145CM (WIRE) ×1 IMPLANT
WIRE ROSEN-J .035X260CM (WIRE) ×1 IMPLANT
WIRE RUNTHROUGH .014X300CM (WIRE) ×1 IMPLANT
WIRE VIPER ADVANCE .017X335CM (WIRE) ×2 IMPLANT

## 2018-10-27 NOTE — Discharge Instructions (Signed)

## 2018-10-27 NOTE — Progress Notes (Addendum)
Dr Kirke Corin in to see pt Pt has been up and has voided.

## 2018-10-27 NOTE — Interval H&P Note (Signed)
History and Physical Interval Note:  10/27/2018 8:40 AM  Calvin Francis  has presented today for surgery, with the diagnosis of pad  The various methods of treatment have been discussed with the patient and family. After consideration of risks, benefits and other options for treatment, the patient has consented to  Procedure(s): PERIPHERAL VASCULAR ATHERECTOMY (N/A) as a surgical intervention .  The patient's history has been reviewed, patient examined, no change in status, stable for surgery.  I have reviewed the patient's chart and labs.  Questions were answered to the patient's satisfaction.     Lorine Bears

## 2018-10-29 ENCOUNTER — Telehealth: Payer: Self-pay | Admitting: *Deleted

## 2018-10-29 DIAGNOSIS — I739 Peripheral vascular disease, unspecified: Secondary | ICD-10-CM

## 2018-10-29 NOTE — Telephone Encounter (Signed)
Orders placed for LEA and ABI post procedure. Message sent to scheduling.

## 2018-11-05 ENCOUNTER — Ambulatory Visit (HOSPITAL_COMMUNITY)
Admission: RE | Admit: 2018-11-05 | Discharge: 2018-11-05 | Disposition: A | Discharge status: Home / Self Care | Payer: Medicare HMO | Source: Ambulatory Visit | Attending: Cardiology | Admitting: Cardiology

## 2018-11-05 DIAGNOSIS — I739 Peripheral vascular disease, unspecified: Secondary | ICD-10-CM | POA: Diagnosis not present

## 2018-11-09 ENCOUNTER — Encounter: Payer: Self-pay | Admitting: Cardiovascular Disease

## 2018-11-09 ENCOUNTER — Ambulatory Visit: Payer: Medicare HMO | Admitting: Cardiovascular Disease

## 2018-11-09 VITALS — BP 104/74 | HR 81 | Ht 74.0 in | Wt 256.2 lb

## 2018-11-09 DIAGNOSIS — I5022 Chronic systolic (congestive) heart failure: Secondary | ICD-10-CM

## 2018-11-09 DIAGNOSIS — E785 Hyperlipidemia, unspecified: Secondary | ICD-10-CM

## 2018-11-09 DIAGNOSIS — I2510 Atherosclerotic heart disease of native coronary artery without angina pectoris: Secondary | ICD-10-CM

## 2018-11-09 DIAGNOSIS — I739 Peripheral vascular disease, unspecified: Secondary | ICD-10-CM | POA: Diagnosis not present

## 2018-11-09 LAB — CBC WITH DIFFERENTIAL/PLATELET
BASOS: 1 %
Basophils Absolute: 0.1 10*3/uL (ref 0.0–0.2)
EOS (ABSOLUTE): 0.3 10*3/uL (ref 0.0–0.4)
Eos: 5 %
Hematocrit: 35.7 % — ABNORMAL LOW (ref 37.5–51.0)
Hemoglobin: 12.1 g/dL — ABNORMAL LOW (ref 13.0–17.7)
IMMATURE GRANS (ABS): 0 10*3/uL (ref 0.0–0.1)
IMMATURE GRANULOCYTES: 1 %
LYMPHS: 36 %
Lymphocytes Absolute: 2.1 10*3/uL (ref 0.7–3.1)
MCH: 28.8 pg (ref 26.6–33.0)
MCHC: 33.9 g/dL (ref 31.5–35.7)
MCV: 85 fL (ref 79–97)
Monocytes Absolute: 0.4 10*3/uL (ref 0.1–0.9)
Monocytes: 7 %
NEUTROS PCT: 50 %
Neutrophils Absolute: 3 10*3/uL (ref 1.4–7.0)
PLATELETS: 229 10*3/uL (ref 150–450)
RBC: 4.2 x10E6/uL (ref 4.14–5.80)
RDW: 14 % (ref 12.3–15.4)
WBC: 5.8 10*3/uL (ref 3.4–10.8)

## 2018-11-09 LAB — BASIC METABOLIC PANEL
BUN/Creatinine Ratio: 8 — ABNORMAL LOW (ref 9–20)
BUN: 10 mg/dL (ref 6–24)
CALCIUM: 9 mg/dL (ref 8.7–10.2)
CO2: 22 mmol/L (ref 20–29)
Chloride: 100 mmol/L (ref 96–106)
Creatinine, Ser: 1.2 mg/dL (ref 0.76–1.27)
GFR calc Af Amer: 78 mL/min/{1.73_m2} (ref >59)
GFR, EST NON AFRICAN AMERICAN: 67 mL/min/{1.73_m2} (ref >59)
Glucose: 86 mg/dL (ref 65–99)
POTASSIUM: 4.5 mmol/L (ref 3.5–5.2)
Sodium: 140 mmol/L (ref 134–144)

## 2018-11-09 MED ORDER — ROSUVASTATIN CALCIUM 10 MG PO TABS: 10.0000 mg | ORAL_TABLET | Freq: Every day | ORAL | 12 refills | Status: DC

## 2018-11-09 NOTE — Progress Notes (Signed)
  Cardiology Office Note   Date:  11/09/2018   ID:  Calvin Francis, DOB 08/05/1961, MRN [id number]  PCP:  Gutierrez, Javier, MD  Cardiologist: Dr. Bensimhon  Chief Complaint  Patient presents with  . Follow-up    post ANGIO      History of Present Illness: Calvin Francis is a 57 y.o. male who is here today for follow-up visit regarding peripheral arterial disease. The patient has extensive cardiac history including coronary artery disease status post CABG in 2005, chronic systolic heart failure due to ischemic cardiomyopathy, hypertension, obesity, hyperlipidemia and previous tobacco use.  He quit smoking last year.  Echocardiogram in July showed an EF of 20 to 25%.  The patient is being considered for advanced heart failure therapy including left ventricular assist device or cardiac transplantation.  He was seen recently for severe bilateral leg claudication worse on the right side. I proceeded with angiography which showed no significant aortoiliac disease.  On the right side, there was discrete calcified stenosis in the proximal to mid SFA with subtotal occlusion of the popliteal artery with three-vessel runoff below the knee.  On the left, there was heavy calcifications in the SFA with mild diffuse disease and severe proximal popliteal artery stenosis with three-vessel runoff below the knee.  I proceeded with recent orbital atherectomy and drug-coated balloon angioplasty to the whole right SFA with excellent results.  He reports complete resolution of right leg claudication.  Postprocedure ABI improved to normal and duplex showed normal velocities.  He is now limited by left calf claudication.  Past Medical History:  Diagnosis Date  . 3-vessel CAD 02/22/2004   mid RCA 100% occluded, L-R collaterals; LAD 60-70% bifurcation lesion; Cx proximal 70% and mid 80% after OM1, OM1 100% occluded. --> CABG x 4  . Barrett's esophagus    s/p EGD several years ago with [place]  . Bronchitis,  mucopurulent recurrent ([facility])   . CAD (coronary artery disease) of bypass graft 5/'12; 12/'13   Cath for angina and Abn Myoview ST: SVG-OM1 now occluded, attempt at PCI to the native circumflex OM1 unsuccessful; distal LAD beyond patent LIMA 70-80% (not PCI amenable); free radial-OM 2 patent; EF 45-50%  . Erectile dysfunction   . Former heavy cigarette smoker (20-39 per day) May 2013   . GERD (gastroesophageal reflux disease)    ?barrett's, with esophageal dysmotility, on omeprazole  . Glucose intolerance (impaired glucose tolerance)  2009   Prediabetes  . Gout   . History of Non-STEMI (non-ST elevated myocardial infarction) 02/22/2004   Three-vessel disease --> referred for CABG  . History of Non-STEMI (non-ST elevated myocardial infarction) December 2011   Hazy lesion in SVG-OM1 --> staged PCI with 4.0 mm x 20 mm vision BMS (4.5 mm)  . HLD (hyperlipidemia)    Statin intolerant  . Hypertension, benign   . Ischemic cardiomyopathy Echo 10/2012   EF ~40%; moderate Posterior HypoKinesis, minld-moderate inferior Hypokinesis; mild RV dilation, mild concentric LVH  . 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 Stable angina ([facility]) 11/2012   Chronic,some what stable but still present; cardiac cath results above, no significant change from 2012  . Testosterone deficiency    On replacement; goal is low normal.  . Varicose veins December 2013   with venous reflux status VNUS ablation of left greater saphenous wein    Past Surgical History:  Procedure Laterality Date  . ABDOMINAL AORTOGRAM W/LOWER EXTREMITY N/A 09/08/2018     Procedure: ABDOMINAL AORTOGRAM W/LOWER EXTREMITY;  Surgeon: Agostino Gorin A, MD;  Location: [facility];  Service: Cardiovascular;  Laterality: N/A;  . CARDIAC CATHETERIZATION  May 01 2011   knowwn occlusion of SVG to RCA  as well as native RCA ; PCI to the SVG to OM1 ;patent LIMA to LAD with diffuse distal  LAD 70-80%,small  vessel dx  not thought to be amenable to PCI .RCA 100% occluded ,OM2 patent,Circ diseased aftr OM1. EF 45-50%  . CORONARY ANGIOPLASTY WITH STENT PLACEMENT  12/25/2010   s/p autologous vessel blockage  . CORONARY ARTERY BYPASS GRAFT  02/27/04   LIMA -LAD, Free Radial-OM2 -- patent; SVG-OM1, SVG-RCA, - 100% occluded by recent cath  . ESOPHAGOGASTRODUODENOSCOPY    . LEFT HEART CATHETERIZATION WITH CORONARY/GRAFT ANGIOGRAM N/A 12/27/2012   Procedure: LEFT HEART CATHETERIZATION WITH CORONARY/GRAFT ANGIOGRAM;  Surgeon: Dasan W Harding, MD;  Location: [facility] CATH LAB: Known nRCA, mLAD, OM1 & OM2 100%, Known SVG-RCA 100%, SVG-OM1 100% ISR. Patent LIMA-dLAD w/ 60-80% dLAD. Patent frRAD-OM2. LCx-RPL collaterals.   EF ~35%  . lower venous extremity doppler Left 08/15/2011   normal ,s/p vein ablation  . MET/CPET  11/02/2012   suboptimal effort but peak VO2  was 52% which relatively significant. Myoview was suggestive for ant. ischemia may go along with his distal LAD   . NM MYOCAR PERF WALL MOTION  12/15/2012   high risk study  . PERIPHERAL VASCULAR ATHERECTOMY Right 10/27/2018   Procedure: PERIPHERAL VASCULAR ATHERECTOMY;  Surgeon: Elois Averitt A, MD;  Location: [facility];  Service: Cardiovascular;  Laterality: Right;  superficial femoral  . RIGHT HEART CATH N/A 07/28/2018   Procedure: RIGHT HEART CATH;  Surgeon: Bensimhon, Daniel R, MD;  Location: [facility];  Service: Cardiovascular;  Laterality: N/A;  . RIGHT/LEFT HEART CATH AND CORONARY/GRAFT ANGIOGRAPHY N/A 03/05/2018   Procedure: RIGHT/LEFT HEART CATH AND CORONARY/GRAFT ANGIOGRAPHY;  Surgeon: Smith, Henry W, MD;  Location: [facility];  Service: Cardiovascular;  Laterality: N/A;  . TRANSTHORACIC ECHOCARDIOGRAM  10/2012   mild LVH, EF 40%, mod posterior and inf wall hypokinesis,mild concentric LVH;; RV dilated, normal  fx.trace MR  . VEIN SURGERY  2011   solomon - h/o vericose veins     Current Outpatient Medications  Medication Sig  Dispense Refill  . ALPRAZolam (XANAX) 0.25 MG tablet TAKE 1 TABLET BY MOUTH TWICE DAILY AS NEEDED FOR ANXIETY (Patient taking differently: Take 0.25 mg by mouth 2 (two) times daily as needed for anxiety. ) 60 tablet 3  . APPLE CIDER VINEGAR PO Take 480 mg by mouth every morning.    . aspirin 81 MG chewable tablet Chew 81 mg by mouth daily.    . carvedilol (COREG) 3.125 MG tablet Take 1 tablet (3.125 mg total) by mouth 2 (two) times daily with a meal. 180 tablet 6  . Coenzyme Q10 (COQ10) 200 MG CAPS Take 600 mg by mouth daily.    . COLCRYS 0.6 MG tablet TAKE AS DIRECTED 2 TABLETS BY MOUTH ON FIRST DAY THEN ONE DAILY AS NEEDED (Patient taking differently: Take 0.6-1.2 mg by mouth daily as needed (take 2 tablets (1.2 mg) by mouth at the onset of gout, then take daily as needed until resolved.). TAKE AS DIRECTED 2 TABLETS BY MOUTH ON FIRST DAY THEN ONE DAILY AS NEEDED) 30 tablet 3  . ezetimibe (ZETIA) 10 MG tablet Take 1 tablet (10 mg total) by mouth daily. 90 tablet 0  . fluorouracil (EFUDEX) 5 %   cream Apply topically 2 (two) times daily. (Patient taking differently: Apply 1 application topically 2 (two) times daily as needed (ezcema flare up). ) 40 g 1  . fluticasone (FLONASE) 50 MCG/ACT nasal spray Place 2 sprays into both nostrils daily as needed for allergies or rhinitis.    . furosemide (LASIX) 20 MG tablet Take 1 tablet (20 mg total) by mouth daily. 90 tablet 3  . nitroGLYCERIN (NITROSTAT) 0.4 MG SL tablet PLACE 1 TABLET UNDER THE TONGUE EVERY 5 MINS AS NEEDED FOR CHEST PAIN 25 tablet 3  . omeprazole (PRILOSEC OTC) 20 MG tablet Take 40 mg by mouth daily.     . prasugrel (EFFIENT) 10 MG TABS tablet Take 1 tablet (10 mg total) by mouth daily. 90 tablet 3  . ranolazine (RANEXA) 1000 MG SR tablet Take 1 tablet (1,000 mg total) by mouth 2 (two) times daily. 180 tablet 3  . sacubitril-valsartan (ENTRESTO) 49-51 MG Take 1 tablet by mouth 2 (two) times daily. 60 tablet 3  . spironolactone (ALDACTONE) 25  MG tablet Take 0.5 tablets (12.5 mg total) by mouth daily. 45 tablet 3  . vitamin B-12 (CYANOCOBALAMIN) 1000 MCG tablet Take 1,000 mcg by mouth daily.      No current facility-administered medications for this visit.     Allergies:   Statins; Zetia [ezetimibe]; Clopidogrel; and Codeine    Social History:  The patient  reports that he quit smoking about 6 years ago. His smoking use included cigarettes. He has a 10.00 pack-year smoking history. He has never used smokeless tobacco. He reports that he drinks alcohol. He reports that he does not use drugs.   Family History:  The patient's family history includes Coronary artery disease in his paternal uncle; Diabetes in his paternal aunt; Stroke in his maternal uncle.    ROS:  Please see the history of present illness.   Otherwise, review of systems are positive for none.   All other systems are reviewed and negative.    PHYSICAL EXAM: VS:  BP 104/74   Pulse 81   Ht 6' 2" (1.88 m)   Wt 256 lb 3.2 oz (116.2 kg)   SpO2 97%   BMI 32.89 kg/m  , BMI Body mass index is 32.89 kg/m. GEN: Well nourished, well developed, in no acute distress  HEENT: normal  Neck: no JVD, carotid bruits, or masses Cardiac: RRR; no murmurs, rubs, or gallops,no edema  Respiratory:  clear to auscultation bilaterally, normal work of breathing GI: soft, nontender, nondistended, + BS MS: no deformity or atrophy  Skin: warm and dry, no rash Neuro:  Strength and sensation are intact Psych: euthymic mood, full affect Vascular: Femoral pulses normal bilaterally.  His pulses are palpable in the right foot and mildly diminished on the left side.     EKG:  EKG is not ordered today.    Recent Labs: 03/07/2018: Magnesium 1.9 07/26/2018: B Natriuretic Peptide 180.7 10/12/2018: ALT 25; BUN 10; Creatinine, Ser 1.19; Hemoglobin 13.1; Platelets 213; Potassium 4.6; Sodium 138    Lipid Panel    Component Value Date/Time   CHOL 210 (H) 10/12/2018 1126   TRIG 269 (H)  10/12/2018 1126   HDL 44 10/12/2018 1126   CHOLHDL 4.8 10/12/2018 1126   CHOLHDL 5.3 03/03/2018 0215   VLDL 30 03/03/2018 0215   LDLCALC 112 (H) 10/12/2018 1126   LDLCALC 122 (H) 10/13/2017 0835   LDLDIRECT 158.0 09/29/2016 1008      Wt Readings from Last 3 Encounters:  11/09/18 256  lb 3.2 oz (116.2 kg)  10/27/18 252 lb (114.3 kg)  10/12/18 253 lb 9.6 oz (115 kg)       PAD Screen 09/03/2018  Previous PAD dx? No  Previous surgical procedure? No  Pain with walking? Yes  Subsides with rest? Yes  Feet/toe relief with dangling? No  Painful, non-healing ulcers? No  Extremities discolored? No      ASSESSMENT AND PLAN:  1.  Severe bilateral calf claudication worse on the right side due to SFA popliteal disease: Status post recent successful atherectomy and drug-coated balloon angioplasty to the right SFA with resolution of right leg claudication.  He continues to have lifestyle limiting claudication affecting the left calf.  Due to that, I recommend proceeding with staged left SFA/popliteal artery atherectomy and drug-coated balloon angioplasty.  I discussed the procedure in details as well as risks and benefits. He is already on dual antiplatelet therapy with aspirin and Prasugrel.  2.  Chronic systolic heart failure: He appears to be euvolemic and currently on optimal medical therapy.  3.  Coronary artery disease involving native coronary arteries: No angina at the present time.  4.  Hyperlipidemia: Intolerance to most statins but seems to be tolerating pravastatin.    Disposition:   FU with me in 1 month  Signed,  Aliviah Spain, MD  11/09/2018 10:54 AM    [facility] 

## 2018-11-09 NOTE — Patient Instructions (Signed)
Medication Instructions:  Your physician has recommended you make the following change in your medication:  1) START Crestor 10 mg by mouth ONCE daily - call us in a week if you have any issues  If you need a refill on your cardiac medications before your next appointment, please call your pharmacy.   Lab work: Your physician recommends that you return for lab work in: TODAY  If you have labs (blood work) drawn today and your tests are completely normal, you will receive your results only by: Marland Kitchen. MyChart Message (if you have MyChart) OR . A paper copy in the mail If you have any lab test that is abnormal or we need to change your treatment, we will call you to review the results.  Testing/Procedures: none  Follow-Up: At [facility], you and your health needs are our priority.  As part of our continuing mission to provide you with exceptional heart care, we have created designated Provider Care Teams.  These Care Teams include your primary Cardiologist (physician) and Advanced Practice Providers (APPs -  Physician Assistants and Nurse Practitioners) who all work together to provide you with the care you need, when you need it. You will need a follow up appointment in 3 weeks from 11/17/2018.  Please call our office 2 months in advance to schedule this appointment.  You may see Dr. Kirke CorinArida - PV or one of the following Advanced Practice Providers on your designated Care Team:   Corine ShelterLuke Kilroy, PA-C Judy PimpleKrista Kroeger, [place] JerseyPA-C . Marjie Skiffallie Goodrich, PA-C  Any Other Special Instructions Will Be Listed Below (If Applicable).

## 2018-11-09 NOTE — H&P (View-Only) (Signed)
Cardiology Office Note   Date:  11/09/2018   ID:  Calvin Francis, DOB 05-27-1961, MRN [id number]  PCP:  Ria Bush, MD  Cardiologist: Dr. Haroldine Laws  Chief Complaint  Patient presents with  . Follow-up    post ANGIO      History of Present Illness: Calvin Francis is a 57 y.o. male who is here today for follow-up visit regarding peripheral arterial disease. The patient has extensive cardiac history including coronary artery disease status post CABG in 6767, chronic systolic heart failure due to ischemic cardiomyopathy, hypertension, obesity, hyperlipidemia and previous tobacco use.  He quit smoking last year.  Echocardiogram in July showed an EF of 20 to 25%.  The patient is being considered for advanced heart failure therapy including left ventricular assist device or cardiac transplantation.  He was seen recently for severe bilateral leg claudication worse on the right side. I proceeded with angiography which showed no significant aortoiliac disease.  On the right side, there was discrete calcified stenosis in the proximal to mid SFA with subtotal occlusion of the popliteal artery with three-vessel runoff below the knee.  On the left, there was heavy calcifications in the SFA with mild diffuse disease and severe proximal popliteal artery stenosis with three-vessel runoff below the knee.  I proceeded with recent orbital atherectomy and drug-coated balloon angioplasty to the whole right SFA with excellent results.  He reports complete resolution of right leg claudication.  Postprocedure ABI improved to normal and duplex showed normal velocities.  He is now limited by left calf claudication.  Past Medical History:  Diagnosis Date  . 3-vessel CAD 02/22/2004   mid RCA 100% occluded, L-R collaterals; LAD 60-70% bifurcation lesion; Cx proximal 70% and mid 80% after OM1, OM1 100% occluded. --> CABG x 4  . Barrett's esophagus    s/p EGD several years ago with [place]  . Bronchitis,  mucopurulent recurrent ([place])   . CAD (coronary artery disease) of bypass graft 5/'12; 12/'13   Cath for angina and Abn Myoview ST: SVG-OM1 now occluded, attempt at PCI to the native circumflex OM1 unsuccessful; distal LAD beyond patent LIMA 70-80% (not PCI amenable); free radial-OM 2 patent; EF 45-50%  . Erectile dysfunction   . Former heavy cigarette smoker (20-39 per day) May 2013   . GERD (gastroesophageal reflux disease)    ?barrett's, with esophageal dysmotility, on omeprazole  . Glucose intolerance (impaired glucose tolerance)  2009   Prediabetes  . Gout   . History of Non-STEMI (non-ST elevated myocardial infarction) 02/22/2004   Three-vessel disease --> referred for CABG  . History of Non-STEMI (non-ST elevated myocardial infarction) December 2011   Hazy lesion in SVG-OM1 --> staged PCI with 4.0 mm x 20 mm vision BMS (4.5 mm)  . HLD (hyperlipidemia)    Statin intolerant  . Hypertension, benign   . Ischemic cardiomyopathy Echo 10/2012   EF ~40%; moderate Posterior HypoKinesis, minld-moderate inferior Hypokinesis; mild RV dilation, mild concentric LVH  . 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 Stable angina ([place]) 11/2012   Chronic,some what stable but still present; cardiac cath results above, no significant change from 2012  . Testosterone deficiency    On replacement; goal is low normal.  . Varicose veins December 2013   with venous reflux status VNUS ablation of left greater saphenous wein    Past Surgical History:  Procedure Laterality Date  . ABDOMINAL AORTOGRAM W/LOWER EXTREMITY N/A 09/08/2018  Procedure: ABDOMINAL AORTOGRAM W/LOWER EXTREMITY;  Surgeon: Wellington Hampshire, MD;  Location: [facility];  Service: Cardiovascular;  Laterality: N/A;  . CARDIAC CATHETERIZATION  May 01 2011   knowwn occlusion of SVG to RCA  as well as native RCA ; PCI to the SVG to OM1 ;patent LIMA to LAD with diffuse distal  LAD 70-80%,small  vessel dx  not thought to be amenable to PCI .RCA 100% occluded ,OM2 patent,Circ diseased aftr OM1. EF 45-50%  . CORONARY ANGIOPLASTY WITH STENT PLACEMENT  12/25/2010   s/p autologous vessel blockage  . CORONARY ARTERY BYPASS GRAFT  02/27/04   LIMA -LAD, Free Radial-OM2 -- patent; SVG-OM1, SVG-RCA, - 100% occluded by recent cath  . ESOPHAGOGASTRODUODENOSCOPY    . LEFT HEART CATHETERIZATION WITH CORONARY/GRAFT ANGIOGRAM N/A 12/27/2012   Procedure: LEFT HEART CATHETERIZATION WITH Beatrix Fetters;  Surgeon: Leonie Man, MD;  Location: [facility]: Known nRCA, mLAD, OM1 & OM2 100%, Known SVG-RCA 100%, SVG-OM1 100% ISR. Patent LIMA-dLAD w/ 60-80% dLAD. Patent frRAD-OM2. LCx-RPL collaterals.   EF ~35%  . lower venous extremity doppler Left 08/15/2011   normal ,s/p vein ablation  . MET/CPET  11/02/2012   suboptimal effort but peak VO2  was 52% which relatively significant. Myoview was suggestive for ant. ischemia may go along with his distal LAD   . NM MYOCAR PERF WALL MOTION  12/15/2012   high risk study  . PERIPHERAL VASCULAR ATHERECTOMY Right 10/27/2018   Procedure: PERIPHERAL VASCULAR ATHERECTOMY;  Surgeon: Wellington Hampshire, MD;  Location: [facility];  Service: Cardiovascular;  Laterality: Right;  superficial femoral  . RIGHT HEART CATH N/A 07/28/2018   Procedure: RIGHT HEART CATH;  Surgeon: Jolaine Artist, MD;  Location: [facility];  Service: Cardiovascular;  Laterality: N/A;  . RIGHT/LEFT HEART CATH AND CORONARY/GRAFT ANGIOGRAPHY N/A 03/05/2018   Procedure: RIGHT/LEFT HEART CATH AND CORONARY/GRAFT ANGIOGRAPHY;  Surgeon: Belva Crome, MD;  Location: [facility];  Service: Cardiovascular;  Laterality: N/A;  . TRANSTHORACIC ECHOCARDIOGRAM  10/2012   mild LVH, EF 40%, mod posterior and inf wall hypokinesis,mild concentric LVH;; RV dilated, normal  fx.trace MR  . VEIN SURGERY  2011   solomon - h/o vericose veins     Current Outpatient Medications  Medication Sig  Dispense Refill  . ALPRAZolam (XANAX) 0.25 MG tablet TAKE 1 TABLET BY MOUTH TWICE DAILY AS NEEDED FOR ANXIETY (Patient taking differently: Take 0.25 mg by mouth 2 (two) times daily as needed for anxiety. ) 60 tablet 3  . APPLE CIDER VINEGAR PO Take 480 mg by mouth every morning.    Marland Kitchen aspirin 81 MG chewable tablet Chew 81 mg by mouth daily.    . carvedilol (COREG) 3.125 MG tablet Take 1 tablet (3.125 mg total) by mouth 2 (two) times daily with a meal. 180 tablet 6  . Coenzyme Q10 (COQ10) 200 MG CAPS Take 600 mg by mouth daily.    Marland Kitchen COLCRYS 0.6 MG tablet TAKE AS DIRECTED 2 TABLETS BY MOUTH ON FIRST DAY THEN ONE DAILY AS NEEDED (Patient taking differently: Take 0.6-1.2 mg by mouth daily as needed (take 2 tablets (1.2 mg) by mouth at the onset of gout, then take daily as needed until resolved.). TAKE AS DIRECTED 2 TABLETS BY MOUTH ON FIRST DAY THEN ONE DAILY AS NEEDED) 30 tablet 3  . ezetimibe (ZETIA) 10 MG tablet Take 1 tablet (10 mg total) by mouth daily. 90 tablet 0  . fluorouracil (EFUDEX) 5 %  cream Apply topically 2 (two) times daily. (Patient taking differently: Apply 1 application topically 2 (two) times daily as needed (ezcema flare up). ) 40 g 1  . fluticasone (FLONASE) 50 MCG/ACT nasal spray Place 2 sprays into both nostrils daily as needed for allergies or rhinitis.    . furosemide (LASIX) 20 MG tablet Take 1 tablet (20 mg total) by mouth daily. 90 tablet 3  . nitroGLYCERIN (NITROSTAT) 0.4 MG SL tablet PLACE 1 TABLET UNDER THE TONGUE EVERY 5 MINS AS NEEDED FOR CHEST PAIN 25 tablet 3  . omeprazole (PRILOSEC OTC) 20 MG tablet Take 40 mg by mouth daily.     . prasugrel (EFFIENT) 10 MG TABS tablet Take 1 tablet (10 mg total) by mouth daily. 90 tablet 3  . ranolazine (RANEXA) 1000 MG SR tablet Take 1 tablet (1,000 mg total) by mouth 2 (two) times daily. 180 tablet 3  . sacubitril-valsartan (ENTRESTO) 49-51 MG Take 1 tablet by mouth 2 (two) times daily. 60 tablet 3  . spironolactone (ALDACTONE) 25  MG tablet Take 0.5 tablets (12.5 mg total) by mouth daily. 45 tablet 3  . vitamin B-12 (CYANOCOBALAMIN) 1000 MCG tablet Take 1,000 mcg by mouth daily.      No current facility-administered medications for this visit.     Allergies:   Statins; Zetia [ezetimibe]; Clopidogrel; and Codeine    Social History:  The patient  reports that he quit smoking about 6 years ago. His smoking use included cigarettes. He has a 10.00 pack-year smoking history. He has never used smokeless tobacco. He reports that he drinks alcohol. He reports that he does not use drugs.   Family History:  The patient's family history includes Coronary artery disease in his paternal uncle; Diabetes in his paternal aunt; Stroke in his maternal uncle.    ROS:  Please see the history of present illness.   Otherwise, review of systems are positive for none.   All other systems are reviewed and negative.    PHYSICAL EXAM: VS:  BP 104/74   Pulse 81   Ht 6' 2" (1.88 m)   Wt 256 lb 3.2 oz (116.2 kg)   SpO2 97%   BMI 32.89 kg/m  , BMI Body mass index is 32.89 kg/m. GEN: Well nourished, well developed, in no acute distress  HEENT: normal  Neck: no JVD, carotid bruits, or masses Cardiac: RRR; no murmurs, rubs, or gallops,no edema  Respiratory:  clear to auscultation bilaterally, normal work of breathing GI: soft, nontender, nondistended, + BS MS: no deformity or atrophy  Skin: warm and dry, no rash Neuro:  Strength and sensation are intact Psych: euthymic mood, full affect Vascular: Femoral pulses normal bilaterally.  His pulses are palpable in the right foot and mildly diminished on the left side.     EKG:  EKG is not ordered today.    Recent Labs: 03/07/2018: Magnesium 1.9 07/26/2018: B Natriuretic Peptide 180.7 10/12/2018: ALT 25; BUN 10; Creatinine, Ser 1.19; Hemoglobin 13.1; Platelets 213; Potassium 4.6; Sodium 138    Lipid Panel    Component Value Date/Time   CHOL 210 (H) 10/12/2018 1126   TRIG 269 (H)  10/12/2018 1126   HDL 44 10/12/2018 1126   CHOLHDL 4.8 10/12/2018 1126   CHOLHDL 5.3 03/03/2018 0215   VLDL 30 03/03/2018 0215   LDLCALC 112 (H) 10/12/2018 1126   LDLCALC 122 (H) 10/13/2017 0835   LDLDIRECT 158.0 09/29/2016 1008      Wt Readings from Last 3 Encounters:  11/09/18 256  lb 3.2 oz (116.2 kg)  10/27/18 252 lb (114.3 kg)  10/12/18 253 lb 9.6 oz (115 kg)       PAD Screen 09/03/2018  Previous PAD dx? No  Previous surgical procedure? No  Pain with walking? Yes  Subsides with rest? Yes  Feet/toe relief with dangling? No  Painful, non-healing ulcers? No  Extremities discolored? No      ASSESSMENT AND PLAN:  1.  Severe bilateral calf claudication worse on the right side due to SFA popliteal disease: Status post recent successful atherectomy and drug-coated balloon angioplasty to the right SFA with resolution of right leg claudication.  He continues to have lifestyle limiting claudication affecting the left calf.  Due to that, I recommend proceeding with staged left SFA/popliteal artery atherectomy and drug-coated balloon angioplasty.  I discussed the procedure in details as well as risks and benefits. He is already on dual antiplatelet therapy with aspirin and Prasugrel.  2.  Chronic systolic heart failure: He appears to be euvolemic and currently on optimal medical therapy.  3.  Coronary artery disease involving native coronary arteries: No angina at the present time.  4.  Hyperlipidemia: Intolerance to most statins but seems to be tolerating pravastatin.    Disposition:   FU with me in 1 month  Signed,  Kathlyn Sacramento, MD  11/09/2018 10:54 AM    [place]

## 2018-11-12 ENCOUNTER — Other Ambulatory Visit: Payer: Self-pay

## 2018-11-12 MED ORDER — ICOSAPENT ETHYL 1 G PO CAPS: 2.0000 | ORAL_CAPSULE | Freq: Two times a day (BID) | ORAL | 6 refills | Status: DC

## 2018-11-12 NOTE — Progress Notes (Signed)
Notes recorded by Lennette BihariKelly, Thomas A, MD on 11/05/2018 at 2:24 PM EST Chemistry normal; lipids remain elevated. With his extensive CAD history, consider referral to pharmacy for PCSK9 to start the approval process. Add vascepa 2 capsules twice a day

## 2018-11-16 ENCOUNTER — Telehealth: Payer: Self-pay | Admitting: *Deleted

## 2018-11-16 NOTE — Telephone Encounter (Signed)
Pt contacted pre[facility]PV procedure [facility] for: Wednesday November 20,2019 10:30 AM Verified [facility] at: 8:30 AM  No solid food after midnight prior to cath, clear liquids until 5 AM day of procedure. Contrast allergy: no Verified no diabetes medications.  Hold: Furosemide-AM of procedure. Spironolactone-AM of procedure.  Except hold medications AM meds can be  taken pre-cath with sip of water including: ASA 81 mg Effient 10 mg  Confirmed patient has responsible person to drive home post procedure and for 24 hours after you arrive home: yes

## 2018-11-17 ENCOUNTER — Ambulatory Visit (HOSPITAL_COMMUNITY)
Admission: RE | Disposition: A | Discharge status: Home / Self Care | Payer: Self-pay | Source: Ambulatory Visit | Attending: Cardiovascular Disease

## 2018-11-17 ENCOUNTER — Other Ambulatory Visit: Payer: Self-pay

## 2018-11-17 ENCOUNTER — Ambulatory Visit (HOSPITAL_COMMUNITY)
Admission: RE | Admit: 2018-11-17 | Discharge: 2018-11-17 | Disposition: A | Discharge status: Home / Self Care | Payer: Medicare HMO | Source: Ambulatory Visit | Attending: Cardiovascular Disease | Admitting: Cardiovascular Disease

## 2018-11-17 DIAGNOSIS — Z6832 Body mass index (BMI) 32.0-32.9, adult: Secondary | ICD-10-CM | POA: Insufficient documentation

## 2018-11-17 DIAGNOSIS — I255 Ischemic cardiomyopathy: Secondary | ICD-10-CM | POA: Insufficient documentation

## 2018-11-17 DIAGNOSIS — I739 Peripheral vascular disease, unspecified: Secondary | ICD-10-CM

## 2018-11-17 DIAGNOSIS — Z888 Allergy status to other drugs, medicaments and biological substances status: Secondary | ICD-10-CM | POA: Insufficient documentation

## 2018-11-17 DIAGNOSIS — Z885 Allergy status to narcotic agent status: Secondary | ICD-10-CM | POA: Insufficient documentation

## 2018-11-17 DIAGNOSIS — Z87891 Personal history of nicotine dependence: Secondary | ICD-10-CM | POA: Diagnosis not present

## 2018-11-17 DIAGNOSIS — Z823 Family history of stroke: Secondary | ICD-10-CM | POA: Diagnosis not present

## 2018-11-17 DIAGNOSIS — I70212 Atherosclerosis of native arteries of extremities with intermittent claudication, left leg: Secondary | ICD-10-CM | POA: Diagnosis not present

## 2018-11-17 DIAGNOSIS — Z7982 Long term (current) use of aspirin: Secondary | ICD-10-CM | POA: Insufficient documentation

## 2018-11-17 DIAGNOSIS — M109 Gout, unspecified: Secondary | ICD-10-CM | POA: Diagnosis not present

## 2018-11-17 DIAGNOSIS — R7303 Prediabetes: Secondary | ICD-10-CM | POA: Insufficient documentation

## 2018-11-17 DIAGNOSIS — Z79899 Other long term (current) drug therapy: Secondary | ICD-10-CM | POA: Insufficient documentation

## 2018-11-17 DIAGNOSIS — I110 Hypertensive heart disease with heart failure: Secondary | ICD-10-CM | POA: Diagnosis not present

## 2018-11-17 DIAGNOSIS — K219 Gastro-esophageal reflux disease without esophagitis: Secondary | ICD-10-CM | POA: Insufficient documentation

## 2018-11-17 DIAGNOSIS — Z955 Presence of coronary angioplasty implant and graft: Secondary | ICD-10-CM | POA: Insufficient documentation

## 2018-11-17 DIAGNOSIS — E669 Obesity, unspecified: Secondary | ICD-10-CM | POA: Diagnosis not present

## 2018-11-17 DIAGNOSIS — N529 Male erectile dysfunction, unspecified: Secondary | ICD-10-CM | POA: Diagnosis not present

## 2018-11-17 DIAGNOSIS — I252 Old myocardial infarction: Secondary | ICD-10-CM | POA: Diagnosis not present

## 2018-11-17 DIAGNOSIS — Z9889 Other specified postprocedural states: Secondary | ICD-10-CM | POA: Diagnosis not present

## 2018-11-17 DIAGNOSIS — Z951 Presence of aortocoronary bypass graft: Secondary | ICD-10-CM | POA: Insufficient documentation

## 2018-11-17 DIAGNOSIS — I70213 Atherosclerosis of native arteries of extremities with intermittent claudication, bilateral legs: Secondary | ICD-10-CM | POA: Insufficient documentation

## 2018-11-17 DIAGNOSIS — Z8249 Family history of ischemic heart disease and other diseases of the circulatory system: Secondary | ICD-10-CM | POA: Insufficient documentation

## 2018-11-17 DIAGNOSIS — I5022 Chronic systolic (congestive) heart failure: Secondary | ICD-10-CM | POA: Diagnosis not present

## 2018-11-17 DIAGNOSIS — E785 Hyperlipidemia, unspecified: Secondary | ICD-10-CM | POA: Diagnosis not present

## 2018-11-17 HISTORY — PX: PERIPHERAL VASCULAR BALLOON ANGIOPLASTY: CATH118281

## 2018-11-17 HISTORY — PX: PERIPHERAL VASCULAR ATHERECTOMY: CATH118256

## 2018-11-17 LAB — POCT ACTIVATED CLOTTING TIME
ACTIVATED CLOTTING TIME: 213 s
Activated Clotting Time: 219 seconds
Activated Clotting Time: 241 seconds

## 2018-11-17 SURGERY — PERIPHERAL VASCULAR ATHERECTOMY
Anesthesia: LOCAL

## 2018-11-17 MED ORDER — SODIUM CHLORIDE 0.9% FLUSH: 3.0000 mL | Freq: Two times a day (BID) | INTRAVENOUS | Status: DC

## 2018-11-17 MED ORDER — MIDAZOLAM HCL 2 MG/2ML IJ SOLN
INTRAMUSCULAR | Status: AC
  Filled 2018-11-17: qty 2

## 2018-11-17 MED ORDER — LIDOCAINE HCL (PF) 1 % IJ SOLN
INTRAMUSCULAR | Status: AC
  Filled 2018-11-17: qty 30

## 2018-11-17 MED ORDER — ASPIRIN 81 MG PO CHEW: 81.0000 mg | CHEWABLE_TABLET | ORAL | Status: DC

## 2018-11-17 MED ORDER — SODIUM CHLORIDE 0.9% FLUSH: 3.0000 mL | INTRAVENOUS | Status: DC | PRN

## 2018-11-17 MED ORDER — FENTANYL CITRATE (PF) 100 MCG/2ML IJ SOLN
INTRAMUSCULAR | Status: DC | PRN
  Administered 2018-11-17: 25 ug via INTRAVENOUS

## 2018-11-17 MED ORDER — IODIXANOL 320 MG/ML IV SOLN
INTRAVENOUS | Status: DC | PRN
  Administered 2018-11-17: 70 mL via INTRA_ARTERIAL

## 2018-11-17 MED ORDER — HEPARIN SODIUM (PORCINE) 1000 UNIT/ML IJ SOLN
INTRAMUSCULAR | Status: AC
  Filled 2018-11-17: qty 1

## 2018-11-17 MED ORDER — SODIUM CHLORIDE 0.9 % IV SOLN: 250.0000 mL | INTRAVENOUS | Status: DC | PRN

## 2018-11-17 MED ORDER — ACETAMINOPHEN 325 MG PO TABS: 650.0000 mg | ORAL_TABLET | ORAL | Status: DC | PRN

## 2018-11-17 MED ORDER — SODIUM CHLORIDE 0.9 % IV SOLN: INTRAVENOUS | Status: DC

## 2018-11-17 MED ORDER — NITROGLYCERIN 1 MG/10 ML FOR IR/CATH LAB
INTRA_ARTERIAL | Status: DC | PRN
  Administered 2018-11-17 (×3): 200 ug via INTRA_ARTERIAL

## 2018-11-17 MED ORDER — ONDANSETRON HCL 4 MG/2ML IJ SOLN: 4.0000 mg | Freq: Four times a day (QID) | INTRAMUSCULAR | Status: DC | PRN

## 2018-11-17 MED ORDER — FENTANYL CITRATE (PF) 100 MCG/2ML IJ SOLN
INTRAMUSCULAR | Status: AC
  Filled 2018-11-17: qty 2

## 2018-11-17 MED ORDER — MIDAZOLAM HCL 2 MG/2ML IJ SOLN
INTRAMUSCULAR | Status: DC | PRN
  Administered 2018-11-17 (×2): 1 mg via INTRAVENOUS
  Administered 2018-11-17: 2 mg via INTRAVENOUS

## 2018-11-17 MED ORDER — SODIUM CHLORIDE 0.9 % IV SOLN
INTRAVENOUS | Status: DC
  Administered 2018-11-17: 10:00:00 via INTRAVENOUS

## 2018-11-17 MED ORDER — HEPARIN (PORCINE) IN NACL 1000-0.9 UT/500ML-% IV SOLN
INTRAVENOUS | Status: DC | PRN
  Administered 2018-11-17 (×2): 500 mL

## 2018-11-17 MED ORDER — LIDOCAINE HCL (PF) 1 % IJ SOLN
INTRAMUSCULAR | Status: DC | PRN
  Administered 2018-11-17: 16 mL via INTRADERMAL

## 2018-11-17 MED ORDER — HEPARIN (PORCINE) IN NACL 1000-0.9 UT/500ML-% IV SOLN
INTRAVENOUS | Status: AC
  Filled 2018-11-17: qty 1000

## 2018-11-17 MED ORDER — VERAPAMIL HCL 2.5 MG/ML IV SOLN
INTRAVENOUS | Status: AC
  Filled 2018-11-17: qty 2

## 2018-11-17 MED ORDER — NITROGLYCERIN IN D5W 200-5 MCG/ML-% IV SOLN
INTRAVENOUS | Status: AC
  Filled 2018-11-17: qty 250

## 2018-11-17 MED ORDER — HEPARIN SODIUM (PORCINE) 1000 UNIT/ML IJ SOLN
INTRAMUSCULAR | Status: DC | PRN
  Administered 2018-11-17: 2000 [IU] via INTRAVENOUS
  Administered 2018-11-17: 8000 [IU] via INTRAVENOUS
  Administered 2018-11-17: 3000 [IU] via INTRAVENOUS

## 2018-11-17 SURGICAL SUPPLY — 26 items
BALLN LUTONIX 6X150X130 (BALLOONS) ×3
BALLN MUSTANG 5X100X135 (BALLOONS) ×3
BALLOON LUTONIX 6X150X130 (BALLOONS) IMPLANT
BALLOON MUSTANG 5X100X135 (BALLOONS) IMPLANT
CATH CROSS OVER TEMPO 5F (CATHETERS) ×1 IMPLANT
CATH QUICKCROSS ANG SELECT (CATHETERS) ×1 IMPLANT
DEVICE EMBOSHIELD NAV6 4.0-7.0 (FILTER) ×1 IMPLANT
DIAMONDBACK SOLID OAS 2.0MM (CATHETERS) ×3
KIT ENCORE 26 ADVANTAGE (KITS) ×1 IMPLANT
KIT MICROPUNCTURE NIT STIFF (SHEATH) ×1 IMPLANT
KIT PV (KITS) ×3 IMPLANT
LUBRICANT VIPERSLIDE CORONARY (MISCELLANEOUS) ×1 IMPLANT
PAD PRO RADIOLUCENT 2001M-C (PAD) ×1 IMPLANT
SHEATH PINNACLE 5F 10CM (SHEATH) ×1 IMPLANT
SHEATH PINNACLE MP 6F 45CM (SHEATH) ×1 IMPLANT
SHEATH PROBE COVER 6X72 (BAG) ×1 IMPLANT
STOPCOCK MORSE 400PSI 3WAY (MISCELLANEOUS) ×1 IMPLANT
SYSTEM DIMNDBCK SLD OAS 2.0MM (CATHETERS) IMPLANT
TAPE VIPERTRACK RADIOPAQ (MISCELLANEOUS) IMPLANT
TAPE VIPERTRACK RADIOPAQUE (MISCELLANEOUS) ×3
TRANSDUCER W/STOPCOCK (MISCELLANEOUS) ×3 IMPLANT
TRAY PV CATH (CUSTOM PROCEDURE TRAY) ×3 IMPLANT
TUBING CIL FLEX 10 FLL-RA (TUBING) ×1 IMPLANT
WIRE HITORQ VERSACORE ST 145CM (WIRE) ×1 IMPLANT
WIRE RUNTHROUGH .014X300CM (WIRE) ×1 IMPLANT
WIRE VIPER ADVANCE .017X335CM (WIRE) ×1 IMPLANT

## 2018-11-17 NOTE — Progress Notes (Signed)
Rcd report from Loma Messingerinda Carter- took over rfa hold- RFA site level 0- hemostasis achieved to rfa site.A dry sterile dressing applied to rfa site. R dp+2 palpable. Pt advised if he laughs, coughs, sneezes or bears down to hold pressure on rfa site- right hand guided to rfa dressing as patient repeats those instructions back to me. Groin level 0

## 2018-11-17 NOTE — Discharge Instructions (Signed)

## 2018-11-17 NOTE — Interval H&P Note (Signed)
History and Physical Interval Note:  11/17/2018 10:28 AM  Calvin Francis  has presented today for surgery, with the diagnosis of pad  The various methods of treatment have been discussed with the patient and family. After consideration of risks, benefits and other options for treatment, the patient has consented to  Procedure(s): PERIPHERAL VASCULAR ATHERECTOMY (N/A) as a surgical intervention .  The patient's history has been reviewed, patient examined, no change in status, stable for surgery.  I have reviewed the patient's chart and labs.  Questions were answered to the patient's satisfaction.     Lorine BearsMuhammad Arida

## 2018-11-17 NOTE — Progress Notes (Signed)
Pt stable with stable VS. Site unremark (R) groin. Report given to Amy Gibson,RN

## 2018-11-18 ENCOUNTER — Ambulatory Visit: Payer: Medicare HMO | Admitting: Cardiovascular Disease

## 2018-11-18 ENCOUNTER — Encounter (HOSPITAL_COMMUNITY): Payer: Self-pay | Admitting: Cardiovascular Disease

## 2018-11-18 ENCOUNTER — Telehealth: Payer: Self-pay | Admitting: *Deleted

## 2018-11-18 DIAGNOSIS — I739 Peripheral vascular disease, unspecified: Secondary | ICD-10-CM

## 2018-11-18 NOTE — Telephone Encounter (Signed)
Post procedure LEA/ABI ordered. Message sent to scheduling.

## 2018-11-19 MED FILL — Nitroglycerin IV Soln 200 MCG/ML in D5W: INTRAVENOUS | Qty: 250 | Status: AC

## 2018-11-19 MED FILL — Verapamil HCl IV Soln 2.5 MG/ML: INTRAVENOUS | Qty: 2 | Status: AC

## 2018-11-29 DIAGNOSIS — Z5181 Encounter for therapeutic drug level monitoring: Secondary | ICD-10-CM | POA: Diagnosis not present

## 2018-11-29 DIAGNOSIS — Z7682 Awaiting organ transplant status: Secondary | ICD-10-CM | POA: Diagnosis not present

## 2018-11-29 DIAGNOSIS — Z949 Transplanted organ and tissue status, unspecified: Secondary | ICD-10-CM | POA: Diagnosis not present

## 2018-11-29 DIAGNOSIS — Z114 Encounter for screening for human immunodeficiency virus [HIV]: Secondary | ICD-10-CM | POA: Diagnosis not present

## 2018-11-29 DIAGNOSIS — I255 Ischemic cardiomyopathy: Secondary | ICD-10-CM | POA: Diagnosis not present

## 2018-11-29 DIAGNOSIS — Z713 Dietary counseling and surveillance: Secondary | ICD-10-CM | POA: Diagnosis not present

## 2018-11-29 DIAGNOSIS — Z01810 Encounter for preprocedural cardiovascular examination: Secondary | ICD-10-CM | POA: Diagnosis not present

## 2018-11-29 DIAGNOSIS — Z125 Encounter for screening for malignant neoplasm of prostate: Secondary | ICD-10-CM | POA: Diagnosis not present

## 2018-11-29 DIAGNOSIS — Z1211 Encounter for screening for malignant neoplasm of colon: Secondary | ICD-10-CM | POA: Diagnosis not present

## 2018-11-29 DIAGNOSIS — Z01818 Encounter for other preprocedural examination: Secondary | ICD-10-CM | POA: Diagnosis not present

## 2018-11-30 ENCOUNTER — Encounter (HOSPITAL_COMMUNITY): Payer: Medicare HMO

## 2018-11-30 DIAGNOSIS — F54 Psychological and behavioral factors associated with disorders or diseases classified elsewhere: Secondary | ICD-10-CM | POA: Diagnosis not present

## 2018-11-30 DIAGNOSIS — Z01810 Encounter for preprocedural cardiovascular examination: Secondary | ICD-10-CM | POA: Diagnosis not present

## 2018-11-30 DIAGNOSIS — Z125 Encounter for screening for malignant neoplasm of prostate: Secondary | ICD-10-CM | POA: Diagnosis not present

## 2018-11-30 DIAGNOSIS — F329 Major depressive disorder, single episode, unspecified: Secondary | ICD-10-CM | POA: Diagnosis not present

## 2018-11-30 DIAGNOSIS — F419 Anxiety disorder, unspecified: Secondary | ICD-10-CM | POA: Diagnosis not present

## 2018-11-30 DIAGNOSIS — Z01818 Encounter for other preprocedural examination: Secondary | ICD-10-CM | POA: Diagnosis not present

## 2018-11-30 DIAGNOSIS — Z951 Presence of aortocoronary bypass graft: Secondary | ICD-10-CM | POA: Diagnosis not present

## 2018-11-30 DIAGNOSIS — I255 Ischemic cardiomyopathy: Secondary | ICD-10-CM | POA: Diagnosis not present

## 2018-11-30 DIAGNOSIS — I517 Cardiomegaly: Secondary | ICD-10-CM | POA: Diagnosis not present

## 2018-11-30 DIAGNOSIS — Z7682 Awaiting organ transplant status: Secondary | ICD-10-CM | POA: Diagnosis not present

## 2018-12-01 DIAGNOSIS — Z01810 Encounter for preprocedural cardiovascular examination: Secondary | ICD-10-CM | POA: Diagnosis not present

## 2018-12-01 DIAGNOSIS — Z951 Presence of aortocoronary bypass graft: Secondary | ICD-10-CM | POA: Diagnosis not present

## 2018-12-01 DIAGNOSIS — K219 Gastro-esophageal reflux disease without esophagitis: Secondary | ICD-10-CM | POA: Diagnosis not present

## 2018-12-01 DIAGNOSIS — E785 Hyperlipidemia, unspecified: Secondary | ICD-10-CM | POA: Diagnosis not present

## 2018-12-01 DIAGNOSIS — K2270 Barrett's esophagus without dysplasia: Secondary | ICD-10-CM | POA: Diagnosis not present

## 2018-12-01 DIAGNOSIS — I110 Hypertensive heart disease with heart failure: Secondary | ICD-10-CM | POA: Diagnosis not present

## 2018-12-01 DIAGNOSIS — I5084 End stage heart failure: Secondary | ICD-10-CM | POA: Diagnosis not present

## 2018-12-01 DIAGNOSIS — Z1211 Encounter for screening for malignant neoplasm of colon: Secondary | ICD-10-CM | POA: Diagnosis not present

## 2018-12-01 DIAGNOSIS — I255 Ischemic cardiomyopathy: Secondary | ICD-10-CM | POA: Diagnosis not present

## 2018-12-01 DIAGNOSIS — I2510 Atherosclerotic heart disease of native coronary artery without angina pectoris: Secondary | ICD-10-CM | POA: Diagnosis not present

## 2018-12-01 DIAGNOSIS — I739 Peripheral vascular disease, unspecified: Secondary | ICD-10-CM | POA: Diagnosis not present

## 2018-12-01 DIAGNOSIS — Z01818 Encounter for other preprocedural examination: Secondary | ICD-10-CM | POA: Diagnosis not present

## 2018-12-02 ENCOUNTER — Encounter (HOSPITAL_COMMUNITY): Payer: Medicare HMO

## 2018-12-02 DIAGNOSIS — K635 Polyp of colon: Secondary | ICD-10-CM | POA: Diagnosis not present

## 2018-12-02 DIAGNOSIS — Z01810 Encounter for preprocedural cardiovascular examination: Secondary | ICD-10-CM | POA: Diagnosis not present

## 2018-12-02 DIAGNOSIS — N200 Calculus of kidney: Secondary | ICD-10-CM | POA: Diagnosis not present

## 2018-12-02 DIAGNOSIS — Z1211 Encounter for screening for malignant neoplasm of colon: Secondary | ICD-10-CM | POA: Diagnosis not present

## 2018-12-02 DIAGNOSIS — R918 Other nonspecific abnormal finding of lung field: Secondary | ICD-10-CM | POA: Diagnosis not present

## 2018-12-02 DIAGNOSIS — Z125 Encounter for screening for malignant neoplasm of prostate: Secondary | ICD-10-CM | POA: Diagnosis not present

## 2018-12-02 DIAGNOSIS — I255 Ischemic cardiomyopathy: Secondary | ICD-10-CM | POA: Diagnosis not present

## 2018-12-02 DIAGNOSIS — Z01818 Encounter for other preprocedural examination: Secondary | ICD-10-CM | POA: Diagnosis not present

## 2018-12-03 ENCOUNTER — Telehealth: Payer: Self-pay

## 2018-12-03 NOTE — Telephone Encounter (Signed)
Spoke to [name] and he is interested in joining the 12-week PREP on Tues/Thurs at the [facility] starting Jan. 7.  We will meet for an intake appointment on 12/16 at 10 am.

## 2018-12-03 NOTE — Telephone Encounter (Signed)
Left a VM for [name] to call back about the PREP at the [facility].  He had expressed in Sept. That he would like to talk about possibly starting the program in the new year.

## 2018-12-06 ENCOUNTER — Encounter: Payer: Self-pay | Admitting: Family Medicine

## 2018-12-06 ENCOUNTER — Other Ambulatory Visit (HOSPITAL_COMMUNITY): Payer: Self-pay | Admitting: Internal Medicine

## 2018-12-06 ENCOUNTER — Ambulatory Visit (HOSPITAL_COMMUNITY)
Admission: RE | Admit: 2018-12-06 | Discharge: 2018-12-06 | Disposition: A | Discharge status: Home / Self Care | Payer: Medicare HMO | Source: Ambulatory Visit | Attending: Cardiology | Admitting: Cardiology

## 2018-12-06 ENCOUNTER — Ambulatory Visit (INDEPENDENT_AMBULATORY_CARE_PROVIDER_SITE_OTHER): Payer: Medicare HMO | Admitting: Family Medicine

## 2018-12-06 VITALS — BP 116/70 | HR 83 | Temp 97.8°F | Ht 73.5 in | Wt 255.0 lb

## 2018-12-06 DIAGNOSIS — F418 Other specified anxiety disorders: Secondary | ICD-10-CM | POA: Diagnosis not present

## 2018-12-06 DIAGNOSIS — Z87891 Personal history of nicotine dependence: Secondary | ICD-10-CM

## 2018-12-06 DIAGNOSIS — I739 Peripheral vascular disease, unspecified: Secondary | ICD-10-CM | POA: Diagnosis not present

## 2018-12-06 DIAGNOSIS — I255 Ischemic cardiomyopathy: Secondary | ICD-10-CM

## 2018-12-06 DIAGNOSIS — M109 Gout, unspecified: Secondary | ICD-10-CM

## 2018-12-06 MED ORDER — PREDNISONE 20 MG PO TABS: ORAL_TABLET | ORAL | 0 refills | Status: DC

## 2018-12-06 MED ORDER — BUSPIRONE HCL 7.5 MG PO TABS: 7.5000 mg | ORAL_TABLET | Freq: Every day | ORAL | 3 refills | Status: AC

## 2018-12-06 MED ORDER — COLCRYS 0.6 MG PO TABS: ORAL_TABLET | ORAL | 3 refills | Status: DC

## 2018-12-06 NOTE — Patient Instructions (Addendum)
I think this is gout - continue colchicine, start prednisone taper sent to pharmacy. Let me know if fever, worsening pain or not improving with above.  Once better, we should start daily gout prevention medication (allopurinol).  Try buspar 7.5mg  at bedtime daily anxiety medicine - try to slowly taper off xanax.   Gout Gout is painful swelling that can occur in some of your joints. Gout is a type of arthritis. This condition is caused by having too much uric acid in your body. Uric acid is a chemical that forms when your body breaks down substances called purines. Purines are important for building body proteins. When your body has too much uric acid, sharp crystals can form and build up inside your joints. This causes pain and swelling. Gout attacks can happen quickly and be very painful (acute gout). Over time, the attacks can affect more joints and become more frequent (chronic gout). Gout can also cause uric acid to build up under your skin and inside your kidneys. What are the causes? This condition is caused by too much uric acid in your blood. This can occur because:  Your kidneys do not remove enough uric acid from your blood. This is the most common cause.  Your body makes too much uric acid. This can occur with some cancers and cancer treatments. It can also occur if your body is breaking down too many red blood cells (hemolytic anemia).  You eat too many foods that are high in purines. These foods include organ meats and some seafood. Alcohol, especially beer, is also high in purines.  A gout attack may be triggered by trauma or stress. What increases the risk? This condition is more likely to develop in people who:  Have a family history of gout.  Are male and middle-aged.  Are male and have gone through menopause.  Are obese.  Frequently drink alcohol, especially beer.  Are dehydrated.  Lose weight too quickly.  Have an organ transplant.  Have lead  poisoning.  Take certain medicines, including aspirin, cyclosporine, diuretics, levodopa, and niacin.  Have kidney disease or psoriasis.  What are the signs or symptoms? An attack of acute gout happens quickly. It usually occurs in just one joint. The most common place is the big toe. Attacks often start at night. Other joints that may be affected include joints of the feet, ankle, knee, fingers, wrist, or elbow. Symptoms may include:  Severe pain.  Warmth.  Swelling.  Stiffness.  Tenderness. The affected joint may be very painful to touch.  Shiny, red, or purple skin.  Chills and fever.  Chronic gout may cause symptoms more frequently. More joints may be involved. You may also have white or yellow lumps (tophi) on your hands or feet or in other areas near your joints. How is this diagnosed? This condition is diagnosed based on your symptoms, medical history, and physical exam. You may have tests, such as:  Blood tests to measure uric acid levels.  Removal of joint fluid with a needle (aspiration) to look for uric acid crystals.  X-rays to look for joint damage.  How is this treated? Treatment for this condition has two phases: treating an acute attack and preventing future attacks. Acute gout treatment may include medicines to reduce pain and swelling, including:  NSAIDs.  Steroids. These are strong anti-inflammatory medicines that can be taken by mouth (orally) or injected into a joint.  Colchicine. This medicine relieves pain and swelling when it is taken soon after an  attack. It can be given orally or through an IV tube.  Preventive treatment may include:  Daily use of smaller doses of NSAIDs or colchicine.  Use of a medicine that reduces uric acid levels in your blood.  Changes to your diet. You may need to see a specialist about healthy eating (dietitian).  Follow these instructions at home: During a Gout Attack  If directed, apply ice to the affected  area: ? Put ice in a plastic bag. ? Place a towel between your skin and the bag. ? Leave the ice on for 20 minutes, 2-3 times a day.  Rest the joint as much as possible. If the affected joint is in your leg, you may be given crutches to use.  Raise (elevate) the affected joint above the level of your heart as often as possible.  Drink enough fluids to keep your urine clear or pale yellow.  Take over-the-counter and prescription medicines only as told by your health care provider.  Do not drive or operate heavy machinery while taking prescription pain medicine.  Follow instructions from your health care provider about eating or drinking restrictions.  Return to your normal activities as told by your health care provider. Ask your health care provider what activities are safe for you. Avoiding Future Gout Attacks  Follow a low-purine diet as told by your dietitian or health care provider. Avoid foods and drinks that are high in purines, including liver, kidney, anchovies, asparagus, herring, mushrooms, mussels, and beer.  Limit alcohol intake to no more than 1 drink a day for nonpregnant women and 2 drinks a day for men. One drink equals 12 oz of beer, 5 oz of wine, or 1 oz of hard liquor.  Maintain a healthy weight or lose weight if you are overweight. If you want to lose weight, talk with your health care provider. It is important that you do not lose weight too quickly.  Start or maintain an exercise program as told by your health care provider.  Drink enough fluids to keep your urine clear or pale yellow.  Take over-the-counter and prescription medicines only as told by your health care provider.  Keep all follow-up visits as told by your health care provider. This is important. Contact a health care provider if:  You have another gout attack.  You continue to have symptoms of a gout attack after10 days of treatment.  You have side effects from your medicines.  You have  chills or a fever.  You have burning pain when you urinate.  You have pain in your lower back or belly. Get help right away if:  You have severe or uncontrolled pain.  You cannot urinate. This information is not intended to replace advice given to you by your health care provider. Make sure you discuss any questions you have with your health care provider. Document Released: 12/12/2000 Document Revised: 05/22/2016 Document Reviewed: 09/27/2015 Elsevier Interactive Patient Education  [employer]2018 [employer]

## 2018-12-06 NOTE — Assessment & Plan Note (Signed)
Significant improvement after recent lower extremity arterial procedures.

## 2018-12-06 NOTE — Assessment & Plan Note (Addendum)
Anticipate R great toe pain coming from acute gout flare - as pain location not consistent with paronychia. Rx prednisone 1 wk taper as well as continued colchicine. He did recently eat more shrimp than normal. He endorses gout attack 4 times a year. Will recommend starting allopurinol after he is over current acute gout flare.  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

## 2018-12-06 NOTE — Progress Notes (Signed)
BP 116/70 (BP Location: Left Arm, Patient Position: Sitting, Cuff Size: Normal)   Pulse 83   Temp 97.8 F (36.6 C) (Oral)   Ht 6' 1.5" (1.867 m)   Wt 255 lb (115.7 kg)   SpO2 96%   BMI 33.19 kg/m    CC: swollen R red big toe Subjective:    Patient ID: Calvin Francis, male    DOB: 03/22/1961, 57 y.o.   MRN: [id number]  HPI: [name] is a 57 y.o. male presenting on 12/06/2018 for Toe Pain (C/o pain in right great toe. Also, c/o swelling and redness. Noticed about 2 wks ago. Tried Colcrys and ice pack. Had recent labs done with [facility]. Says uric acid was high.)   3 wk h/o R great toe throbbing pain, swelling, redness. Thought it may have started as spider bite. Denies inciting trauma/injury or fall.   Using ice on toe to sleep at night. Tried colchicine with mild benefit. Has had trouble tolerated prednisone in the past. Has not been on allopurinol.  Today looking better than previously.  Didn't feel like gout.  Normally has 4 gout flares/year.  He did recently have more shrimp than usual.  On heart transplant list at [facility]. Had labwork done yesterday. Uric acid was 9.1.   Finally got legs taken care of - had stents placed bilateral legs with marked relief in claudication symptoms.   Quit smoking 1 yr ago.  Has been using xanax twice daily regularly.  Has tried zoloft and other antidepressants without benefit.   Relevant past medical, surgical, family and social history reviewed and updated as indicated. Interim medical history since our last visit reviewed. Allergies and medications reviewed and updated. Outpatient Medications Prior to Visit  Medication Sig Dispense Refill  . ALPRAZolam (XANAX) 0.25 MG tablet TAKE 1 TABLET BY MOUTH TWICE DAILY AS NEEDED FOR ANXIETY (Patient taking differently: Take 0.25 mg by mouth 2 (two) times daily as needed for anxiety. ) 60 tablet 3  . aspirin 81 MG chewable tablet Chew 81 mg by mouth daily.    . carvedilol (COREG) 3.125 MG tablet Take  1 tablet (3.125 mg total) by mouth 2 (two) times daily with a meal. 180 tablet 6  . Coenzyme Q10 (COQ10) 200 MG CAPS Take 600 mg by mouth daily.    Marland Kitchen ENTRESTO 49-51 MG TAKE 1 TABLET BY MOUTH TWICE DAILY 60 tablet 3  . ezetimibe (ZETIA) 10 MG tablet Take 1 tablet (10 mg total) by mouth daily. 90 tablet 0  . fluorouracil (EFUDEX) 5 % cream Apply topically 2 (two) times daily. (Patient taking differently: Apply 1 application topically 2 (two) times daily as needed (ezcema flare up). ) 40 g 1  . fluticasone (FLONASE) 50 MCG/ACT nasal spray Place 2 sprays into both nostrils daily as needed for allergies or rhinitis.    . furosemide (LASIX) 20 MG tablet Take 1 tablet (20 mg total) by mouth daily. 90 tablet 3  . nitroGLYCERIN (NITROSTAT) 0.4 MG SL tablet PLACE 1 TABLET UNDER THE TONGUE EVERY 5 MINS AS NEEDED FOR CHEST PAIN (Patient taking differently: Place 0.4 mg under the tongue every 5 (five) minutes as needed for chest pain. ) 25 tablet 3  . omeprazole (PRILOSEC OTC) 20 MG tablet Take 40 mg by mouth daily.     . prasugrel (EFFIENT) 10 MG TABS tablet Take 1 tablet (10 mg total) by mouth daily. 90 tablet 3  . ranolazine (RANEXA) 1000 MG SR tablet Take 1 tablet (1,000  mg total) by mouth 2 (two) times daily. 180 tablet 3  . rosuvastatin (CRESTOR) 10 MG tablet Take 1 tablet (10 mg total) by mouth daily. (Patient taking differently: Take 10 mg by mouth every evening. ) 30 tablet 12  . spironolactone (ALDACTONE) 25 MG tablet Take 0.5 tablets (12.5 mg total) by mouth daily. 45 tablet 3  . vitamin B-12 (CYANOCOBALAMIN) 1000 MCG tablet Take 1,000 mcg by mouth daily.     Marland Kitchen. COLCRYS 0.6 MG tablet TAKE AS DIRECTED 2 TABLETS BY MOUTH ON FIRST DAY THEN ONE DAILY AS NEEDED (Patient taking differently: Take 0.6-1.2 mg by mouth daily as needed (take 2 tablets (1.2 mg) by mouth at the onset of gout, then take daily as needed until resolved.). TAKE AS DIRECTED 2 TABLETS BY MOUTH ON FIRST DAY THEN ONE DAILY AS NEEDED) 30  tablet 3   No facility-administered medications prior to visit.      Per HPI unless specifically indicated in ROS section below Review of Systems     Objective:    BP 116/70 (BP Location: Left Arm, Patient Position: Sitting, Cuff Size: Normal)   Pulse 83   Temp 97.8 F (36.6 C) (Oral)   Ht 6' 1.5" (1.867 m)   Wt 255 lb (115.7 kg)   SpO2 96%   BMI 33.19 kg/m   Wt Readings from Last 3 Encounters:  12/06/18 255 lb (115.7 kg)  11/17/18 244 lb (110.7 kg)  11/09/18 256 lb 3.2 oz (116.2 kg)    Physical Exam  Constitutional: He appears well-developed and well-nourished. No distress.  Musculoskeletal: Normal range of motion. He exhibits no edema.  Diminished pulses bilaterally Erythema proximal to R great toe nail bed Point tender to palpation at IP joint, not at area of erythema however No pain of great toe with axial loading  Skin: Skin is warm and dry. There is erythema.  Erythema of right great toe proximal to nailbed Swelling of R distal great toe as well as R distal 3rd toe at DIP joint  Nursing note and vitals reviewed.  Uric acid at [facility] last week 9.1 - reviewed in care everywhere.      Assessment & Plan:   Problem List Items Addressed This Visit    PAD (peripheral artery disease) (HCC)    Significant improvement after recent lower extremity arterial procedures.       Ischemic cardiomyopathy (Chronic)    [facility]       Gout - Primary    Anticipate R great toe pain coming from acute gout flare - as pain location not consistent with paronychia. Rx prednisone 1 wk taper as well as continued colchicine. He did recently eat more shrimp than normal. He endorses gout attack 4 times a year. Will recommend starting allopurinol after he is over current acute gout flare.  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 Again reviewed habit forming nature of medication, recommended transition to more regular daily medication for ongoing symptoms. Also recommended he work towards healthy stress relieving strategies. Will trial buspar 7.5mg  nightly for daily anxiety, with goal to slowly taper down on xanax use. Pt agrees with plan.       Relevant Medications   busPIRone (BUSPAR) 7.5 MG tablet  Meds ordered this encounter  Medications  . predniSONE (DELTASONE) 20 MG tablet    Sig: Take two tablets daily for 3 days followed by one tablet daily for 4 days    Dispense:  10 tablet    Refill:  0  . busPIRone (BUSPAR) 7.5 MG tablet    Sig: Take 1 tablet (7.5 mg total) by mouth at bedtime.    Dispense:  30 tablet    Refill:  3  . COLCRYS 0.6 MG tablet    Sig: TAKE AS DIRECTED 2 TABLETS BY MOUTH ON FIRST DAY THEN ONE DAILY AS NEEDED    Dispense:  30 tablet    Refill:  3   No orders of the defined types were placed in this encounter.   Follow up plan: Return if symptoms worsen or fail to improve.  Eustaquio Boyden, MD

## 2018-12-06 NOTE — Assessment & Plan Note (Signed)
Congratulated on quitting 2018

## 2018-12-06 NOTE — Assessment & Plan Note (Signed)
[facility]

## 2018-12-06 NOTE — Assessment & Plan Note (Signed)
Predominant anxiety - he has been on xanax long term, has been using more regularly recently. Again reviewed habit forming nature of medication, recommended transition to more regular daily medication for ongoing symptoms. Also recommended he work towards healthy stress relieving strategies. Will trial buspar 7.5mg  nightly for daily anxiety, with goal to slowly taper down on xanax use. Pt agrees with plan.

## 2018-12-07 ENCOUNTER — Ambulatory Visit: Payer: Medicare HMO | Admitting: Cardiovascular Disease

## 2018-12-07 ENCOUNTER — Encounter: Payer: Self-pay | Admitting: Cardiovascular Disease

## 2018-12-07 VITALS — BP 102/73 | HR 88 | Ht 74.0 in | Wt 253.2 lb

## 2018-12-07 DIAGNOSIS — E785 Hyperlipidemia, unspecified: Secondary | ICD-10-CM | POA: Diagnosis not present

## 2018-12-07 DIAGNOSIS — I2510 Atherosclerotic heart disease of native coronary artery without angina pectoris: Secondary | ICD-10-CM

## 2018-12-07 DIAGNOSIS — I5022 Chronic systolic (congestive) heart failure: Secondary | ICD-10-CM | POA: Diagnosis not present

## 2018-12-07 DIAGNOSIS — I739 Peripheral vascular disease, unspecified: Secondary | ICD-10-CM | POA: Diagnosis not present

## 2018-12-07 MED ORDER — ROSUVASTATIN CALCIUM 5 MG PO TABS: 5.0000 mg | ORAL_TABLET | Freq: Every day | ORAL | 6 refills | Status: AC

## 2018-12-07 NOTE — Progress Notes (Signed)
Cardiology Office Note   Date:  12/07/2018   ID:  Avant, Printy 1961/08/17, MRN [id number]  PCP:  Ria Bush, MD  Cardiologist: Dr. Haroldine Laws Dr. Claiborne Billings  Chief Complaint  Patient presents with  . Follow-up    post Angio      History of Present Illness: Calvin Francis is a 57 y.o. male who is here today for follow-up visit regarding peripheral arterial disease. The patient has extensive cardiac history including coronary artery disease status post CABG in 2836, chronic systolic heart failure due to ischemic cardiomyopathy, hypertension, obesity, hyperlipidemia and previous tobacco use.  He quit smoking last year.  Echocardiogram in July showed an EF of 20 to 25%.  The patient is being considered for advanced heart failure therapy including left ventricular assist device or cardiac transplantation.  He was seen recently for severe bilateral leg claudication worse on the right side. I proceeded with angiography which showed no significant aortoiliac disease.  On the right side, there was discrete calcified stenosis in the proximal to mid SFA with subtotal occlusion of the popliteal artery with three-vessel runoff below the knee.  On the left, there was heavy calcifications in the SFA with mild diffuse disease and severe proximal popliteal artery stenosis with three-vessel runoff below the knee.  He underwent bilateral orbital atherectomy and drug-coated balloon angioplasty to both SFAs.  He reports resolution of claudication since then and he is able to walk longer distance. I started him on small dose rosuvastatin and he reports mild cramping with this.    Past Medical History:  Diagnosis Date  . 3-vessel CAD 02/22/2004   mid RCA 100% occluded, L-R collaterals; LAD 60-70% bifurcation lesion; Cx proximal 70% and mid 80% after OM1, OM1 100% occluded. --> CABG x 4  . Barrett's esophagus    s/p EGD several years ago with [facility]  . Bronchitis, mucopurulent recurrent  ([place])   . CAD (coronary artery disease) of bypass graft 5/'12; 12/'13   Cath for angina and Abn Myoview ST: SVG-OM1 now occluded, attempt at PCI to the native circumflex OM1 unsuccessful; distal LAD beyond patent LIMA 70-80% (not PCI amenable); free radial-OM 2 patent; EF 45-50%  . Erectile dysfunction   . Former heavy cigarette smoker (20-39 per day) May 2013   . GERD (gastroesophageal reflux disease)    ?barrett's, with esophageal dysmotility, on omeprazole  . Glucose intolerance (impaired glucose tolerance)  2009   Prediabetes  . Gout   . History of Non-STEMI (non-ST elevated myocardial infarction) 02/22/2004   Three-vessel disease --> referred for CABG  . History of Non-STEMI (non-ST elevated myocardial infarction) December 2011   Hazy lesion in SVG-OM1 --> staged PCI with 4.0 mm x 20 mm vision BMS (4.5 mm)  . HLD (hyperlipidemia)    Statin intolerant  . Hypertension, benign   . Ischemic cardiomyopathy Echo 10/2012   EF ~40%; moderate Posterior HypoKinesis, minld-moderate inferior Hypokinesis; mild RV dilation, mild concentric LVH  . 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 Stable angina ([place]) 11/2012   Chronic,some what stable but still present; cardiac cath results above, no significant change from 2012  . Testosterone deficiency    On replacement; goal is low normal.  . Varicose veins December 2013   with venous reflux status VNUS ablation of left greater saphenous wein    Past Surgical History:  Procedure Laterality Date  . ABDOMINAL AORTOGRAM W/LOWER EXTREMITY N/A 09/08/2018  Procedure: ABDOMINAL AORTOGRAM W/LOWER EXTREMITY;  Surgeon: Wellington Hampshire, MD;  Location: [place] [facility];  Service: Cardiovascular;  Laterality: N/A;  . CARDIAC CATHETERIZATION  May 01 2011   knowwn occlusion of SVG to RCA  as well as native RCA ; PCI to the SVG to OM1 ;patent LIMA to LAD with diffuse distal  LAD 70-80%,small  vessel dx not thought to be  amenable to PCI .RCA 100% occluded ,OM2 patent,Circ diseased aftr OM1. EF 45-50%  . CORONARY ANGIOPLASTY WITH STENT PLACEMENT  12/25/2010   s/p autologous vessel blockage  . CORONARY ARTERY BYPASS GRAFT  02/27/04   LIMA -LAD, Free Radial-OM2 -- patent; SVG-OM1, SVG-RCA, - 100% occluded by recent cath  . ESOPHAGOGASTRODUODENOSCOPY    . LEFT HEART CATHETERIZATION WITH CORONARY/GRAFT ANGIOGRAM N/A 12/27/2012   Procedure: LEFT HEART CATHETERIZATION WITH Beatrix Fetters;  Surgeon: Leonie Man, MD;  Location: [facility] CATH LAB: Known nRCA, mLAD, OM1 & OM2 100%, Known SVG-RCA 100%, SVG-OM1 100% ISR. Patent LIMA-dLAD w/ 60-80% dLAD. Patent frRAD-OM2. LCx-RPL collaterals.   EF ~35%  . lower venous extremity doppler Left 08/15/2011   normal ,s/p vein ablation  . MET/CPET  11/02/2012   suboptimal effort but peak VO2  was 52% which relatively significant. Myoview was suggestive for ant. ischemia may go along with his distal LAD   . NM MYOCAR PERF WALL MOTION  12/15/2012   high risk study  . PERIPHERAL VASCULAR ATHERECTOMY Right 10/27/2018   Procedure: PERIPHERAL VASCULAR ATHERECTOMY;  Surgeon: Wellington Hampshire, MD;  Location: [place] [facility];  Service: Cardiovascular;  Laterality: Right;  superficial femoral  . PERIPHERAL VASCULAR ATHERECTOMY N/A 11/17/2018   Procedure: PERIPHERAL VASCULAR ATHERECTOMY;  Surgeon: Wellington Hampshire, MD;  Location: [place] [facility];  Service: Cardiovascular;  Laterality: N/A;  . PERIPHERAL VASCULAR BALLOON ANGIOPLASTY  11/17/2018   Procedure: PERIPHERAL VASCULAR BALLOON ANGIOPLASTY;  Surgeon: Wellington Hampshire, MD;  Location: [place] [facility];  Service: Cardiovascular;;  Left SFA popliteal  . RIGHT HEART CATH N/A 07/28/2018   Procedure: RIGHT HEART CATH;  Surgeon: Jolaine Artist, MD;  Location: [place] [facility];  Service: Cardiovascular;  Laterality: N/A;  . RIGHT/LEFT HEART CATH AND CORONARY/GRAFT ANGIOGRAPHY N/A 03/05/2018   Procedure: RIGHT/LEFT HEART  CATH AND CORONARY/GRAFT ANGIOGRAPHY;  Surgeon: Belva Crome, MD;  Location: [place] [facility];  Service: Cardiovascular;  Laterality: N/A;  . TRANSTHORACIC ECHOCARDIOGRAM  10/2012   mild LVH, EF 40%, mod posterior and inf wall hypokinesis,mild concentric LVH;; RV dilated, normal  fx.trace MR  . VEIN SURGERY  2011   solomon - h/o vericose veins     Current Outpatient Medications  Medication Sig Dispense Refill  . ALPRAZolam (XANAX) 0.25 MG tablet TAKE 1 TABLET BY MOUTH TWICE DAILY AS NEEDED FOR ANXIETY (Patient taking differently: Take 0.25 mg by mouth 2 (two) times daily as needed for anxiety. ) 60 tablet 3  . aspirin 81 MG chewable tablet Chew 81 mg by mouth daily.    . busPIRone (BUSPAR) 7.5 MG tablet Take 1 tablet (7.5 mg total) by mouth at bedtime. 30 tablet 3  . carvedilol (COREG) 3.125 MG tablet Take 1 tablet (3.125 mg total) by mouth 2 (two) times daily with a meal. 180 tablet 6  . Coenzyme Q10 (COQ10) 200 MG CAPS Take 600 mg by mouth daily.    Marland Kitchen COLCRYS 0.6 MG tablet TAKE AS DIRECTED 2 TABLETS BY MOUTH ON FIRST DAY THEN ONE DAILY AS NEEDED 30 tablet 3  .  ENTRESTO 49-51 MG TAKE 1 TABLET BY MOUTH TWICE DAILY 60 tablet 3  . ezetimibe (ZETIA) 10 MG tablet Take 1 tablet (10 mg total) by mouth daily. 90 tablet 0  . fluorouracil (EFUDEX) 5 % cream Apply topically 2 (two) times daily. (Patient taking differently: Apply 1 application topically 2 (two) times daily as needed (ezcema flare up). ) 40 g 1  . fluticasone (FLONASE) 50 MCG/ACT nasal spray Place 2 sprays into both nostrils daily as needed for allergies or rhinitis.    . furosemide (LASIX) 20 MG tablet Take 1 tablet (20 mg total) by mouth daily. 90 tablet 3  . nitroGLYCERIN (NITROSTAT) 0.4 MG SL tablet PLACE 1 TABLET UNDER THE TONGUE EVERY 5 MINS AS NEEDED FOR CHEST PAIN (Patient taking differently: Place 0.4 mg under the tongue every 5 (five) minutes as needed for chest pain. ) 25 tablet 3  . omeprazole (PRILOSEC OTC) 20 MG tablet  Take 40 mg by mouth daily.     . prasugrel (EFFIENT) 10 MG TABS tablet Take 1 tablet (10 mg total) by mouth daily. 90 tablet 3  . predniSONE (DELTASONE) 20 MG tablet Take two tablets daily for 3 days followed by one tablet daily for 4 days 10 tablet 0  . ranolazine (RANEXA) 1000 MG SR tablet Take 1 tablet (1,000 mg total) by mouth 2 (two) times daily. 180 tablet 3  . rosuvastatin (CRESTOR) 10 MG tablet Take 1 tablet (10 mg total) by mouth daily. (Patient taking differently: Take 10 mg by mouth every evening. ) 30 tablet 12  . spironolactone (ALDACTONE) 25 MG tablet Take 0.5 tablets (12.5 mg total) by mouth daily. 45 tablet 3  . vitamin B-12 (CYANOCOBALAMIN) 1000 MCG tablet Take 1,000 mcg by mouth daily.      No current facility-administered medications for this visit.     Allergies:   Statins; Zetia [ezetimibe]; Clopidogrel; and Codeine    Social History:  The patient  reports that he quit smoking about 6 years ago. His smoking use included cigarettes. He has a 10.00 pack-year smoking history. He has never used smokeless tobacco. He reports that he drinks alcohol. He reports that he does not use drugs.   Family History:  The patient's family history includes Coronary artery disease in his paternal uncle; Diabetes in his paternal aunt; Stroke in his maternal uncle.    ROS:  Please see the history of present illness.   Otherwise, review of systems are positive for none.   All other systems are reviewed and negative.    PHYSICAL EXAM: VS:  BP 102/73   Pulse 88   Ht 6' 2" (1.88 m)   Wt 253 lb 3.2 oz (114.9 kg)   BMI 32.51 kg/m  , BMI Body mass index is 32.51 kg/m. GEN: Well nourished, well developed, in no acute distress  HEENT: normal  Neck: no JVD, carotid bruits, or masses Cardiac: RRR; no murmurs, rubs, or gallops,no edema  Respiratory:  clear to auscultation bilaterally, normal work of breathing GI: soft, nontender, nondistended, + BS MS: no deformity or atrophy  Skin: warm and  dry, no rash Neuro:  Strength and sensation are intact Psych: euthymic mood, full affect Vascular: Femoral pulses normal bilaterally.  He has strong palpable posterior tibial pulse bilaterally.  EKG:  EKG is not ordered today.    Recent Labs: 03/07/2018: Magnesium 1.9 07/26/2018: B Natriuretic Peptide 180.7 10/12/2018: ALT 25 11/09/2018: BUN 10; Creatinine, Ser 1.20; Hemoglobin 12.1; Platelets 229; Potassium 4.5; Sodium 140  Lipid Panel    Component Value Date/Time   CHOL 210 (H) 10/12/2018 1126   TRIG 269 (H) 10/12/2018 1126   HDL 44 10/12/2018 1126   CHOLHDL 4.8 10/12/2018 1126   CHOLHDL 5.3 03/03/2018 0215   VLDL 30 03/03/2018 0215   LDLCALC 112 (H) 10/12/2018 1126   LDLCALC 122 (H) 10/13/2017 0835   LDLDIRECT 158.0 09/29/2016 1008      Wt Readings from Last 3 Encounters:  12/07/18 253 lb 3.2 oz (114.9 kg)  12/06/18 255 lb (115.7 kg)  11/17/18 244 lb (110.7 kg)       PAD Screen 09/03/2018  Previous PAD dx? No  Previous surgical procedure? No  Pain with walking? Yes  Subsides with rest? Yes  Feet/toe relief with dangling? No  Painful, non-healing ulcers? No  Extremities discolored? No      ASSESSMENT AND PLAN:  1.  Severe bilateral calf claudication due to significant SFA popliteal disease: Status post successful endovascular intervention with atherectomy and drug-coated balloon angioplasty with resolution of claudication.  ABI postprocedure was normal and duplex showed normal velocities.  Repeat vascular studies in 6 months.   He is already on dual antiplatelet therapy with aspirin and Prasugrel.  2.  Chronic systolic heart failure: He appears to be euvolemic and currently on optimal medical therapy.  He is getting evaluation for cardiac transplant at [facility].  3.  Coronary artery disease involving native coronary arteries: No angina at the present time.  4.  Hyperlipidemia: He has history of intolerance to statins and I recently started him on rosuvastatin.   He reports mild myalgia and I elected to decrease the dose to 5 mg daily.  He is also on Zetia.  Check lipid and liver profile in 1 month and consider referral to the lipid clinic if he is not at target.    Disposition:   FU with me in 6 months  Signed,  Kathlyn Sacramento, MD  12/07/2018 8:45 AM    [place]

## 2018-12-07 NOTE — Patient Instructions (Signed)
Medication Instructions:  DECREASE ROSUVASTATIN TO 5 MG DAILY    If you need a refill on your cardiac medications before your next appointment, please call your pharmacy.   Lab work: LABS IN ONE MONTH LIPID LIER PROFLIE  If you have labs (blood work) drawn today and your tests are completely normal, you will receive your results only by: Marland Kitchen. MyChart Message (if you have MyChart) OR . A paper copy in the mail If you have any lab test that is abnormal or we need to change your treatment, we will call you to review the results.  Testing/Procedures: SCHEDULE AT [address] IN 6 MONTHS - June 2020 Your physician has requested that you have an ankle brachial index (ABI). During this test an ultrasound and blood pressure cuff are used to evaluate the arteries that supply the arms and legs with blood. Allow thirty minutes for this exam. There are no restrictions or special instructions.   AND Your physician has requested that you have a lower  extremity arterial duplex. This test is an ultrasound of the arteries in the legs. It looks at arterial blood flow in the legs. Allow one hour for Lower Arterial scans. There are no restrictions or special instructions  Follow-Up: At [facility], you and your health needs are our priority.  As part of our continuing mission to provide you with exceptional heart care, we have created designated Provider Care Teams.  These Care Teams include your primary Cardiologist (physician) and Advanced Practice Providers (APPs -  Physician Assistants and Nurse Practitioners) who all work together to provide you with the care you need, when you need it. You will need a follow up appointment in 6 months.  Please call our office 2 months in advance to schedule this appointment.  You may see DR Kirke CorinARIDA  or one of the following Advanced Practice Providers on your designated Care Team:   Corine ShelterLuke Kilroy, PA-C Judy PimpleKrista Kroeger, [place] JerseyPA-C . Marjie Skiffallie Goodrich, PA-C  Any  Other Special Instructions Will Be Listed Below (If Applicable).

## 2018-12-08 ENCOUNTER — Ambulatory Visit (HOSPITAL_COMMUNITY): Payer: Medicare HMO | Attending: Internal Medicine

## 2018-12-08 ENCOUNTER — Other Ambulatory Visit (HOSPITAL_COMMUNITY): Payer: Self-pay | Admitting: *Deleted

## 2018-12-08 ENCOUNTER — Other Ambulatory Visit (HOSPITAL_COMMUNITY): Payer: Self-pay | Admitting: Cardiology

## 2018-12-08 DIAGNOSIS — I5022 Chronic systolic (congestive) heart failure: Secondary | ICD-10-CM

## 2018-12-09 ENCOUNTER — Ambulatory Visit: Payer: Medicare HMO

## 2018-12-09 ENCOUNTER — Telehealth: Payer: Self-pay | Admitting: *Deleted

## 2018-12-09 NOTE — Telephone Encounter (Signed)
Spoke to pt who states he is on the heart transplant list, and he was advised by [facility] to consult his PCP about getting the HepB series. pls advise

## 2018-12-11 NOTE — Telephone Encounter (Addendum)
Meds he will be on post-transplant will be immuno-compromising so likely a good idea for him to receive. Did the transplant docs recommend he get the series? We can provide in office, but would have him check with his insurance to see how well these shots are covered. He doesn't have a specific indication for the vaccines other than possible upcoming immunocompromised status.

## 2018-12-12 ENCOUNTER — Encounter: Payer: Self-pay | Admitting: Family Medicine

## 2018-12-12 DIAGNOSIS — K760 Fatty (change of) liver, not elsewhere classified: Secondary | ICD-10-CM | POA: Insufficient documentation

## 2018-12-12 NOTE — Telephone Encounter (Signed)
Actually [facility], showed signs of fatty liver changes - this is indication for hep B series. Agree with receiving in office. Thanks.

## 2018-12-14 NOTE — Telephone Encounter (Signed)
Left message on vm per dpr relaying Dr. Timoteo ExposeG's message. Asked pt to call and schedule [place] for hep B vaccine series.

## 2018-12-15 ENCOUNTER — Ambulatory Visit (INDEPENDENT_AMBULATORY_CARE_PROVIDER_SITE_OTHER): Payer: Medicare HMO

## 2018-12-15 ENCOUNTER — Encounter: Payer: Self-pay | Admitting: *Deleted

## 2018-12-15 DIAGNOSIS — Z23 Encounter for immunization: Secondary | ICD-10-CM

## 2018-12-15 DIAGNOSIS — Z8719 Personal history of other diseases of the digestive system: Secondary | ICD-10-CM | POA: Diagnosis not present

## 2018-12-15 NOTE — Progress Notes (Signed)
Per orders of Dr. Sharen HonesGutierrez, injection of Hepatitis B #1 given by Consuella LoseAnastasiya V Greenlee Ancheta. Patient tolerated injection well.

## 2019-01-05 ENCOUNTER — Ambulatory Visit (HOSPITAL_COMMUNITY)
Admission: RE | Admit: 2019-01-05 | Discharge: 2019-01-05 | Disposition: A | Discharge status: Home / Self Care | Payer: Medicare HMO | Source: Ambulatory Visit | Attending: Internal Medicine | Admitting: Internal Medicine

## 2019-01-05 ENCOUNTER — Encounter (HOSPITAL_COMMUNITY): Payer: Self-pay | Admitting: Internal Medicine

## 2019-01-05 VITALS — BP 140/72 | HR 94 | Wt 256.0 lb

## 2019-01-05 DIAGNOSIS — K219 Gastro-esophageal reflux disease without esophagitis: Secondary | ICD-10-CM | POA: Diagnosis not present

## 2019-01-05 DIAGNOSIS — Z7982 Long term (current) use of aspirin: Secondary | ICD-10-CM | POA: Insufficient documentation

## 2019-01-05 DIAGNOSIS — Z951 Presence of aortocoronary bypass graft: Secondary | ICD-10-CM | POA: Diagnosis not present

## 2019-01-05 DIAGNOSIS — Z7901 Long term (current) use of anticoagulants: Secondary | ICD-10-CM | POA: Insufficient documentation

## 2019-01-05 DIAGNOSIS — Z833 Family history of diabetes mellitus: Secondary | ICD-10-CM | POA: Diagnosis not present

## 2019-01-05 DIAGNOSIS — I5023 Acute on chronic systolic (congestive) heart failure: Secondary | ICD-10-CM | POA: Insufficient documentation

## 2019-01-05 DIAGNOSIS — E785 Hyperlipidemia, unspecified: Secondary | ICD-10-CM | POA: Insufficient documentation

## 2019-01-05 DIAGNOSIS — I252 Old myocardial infarction: Secondary | ICD-10-CM | POA: Insufficient documentation

## 2019-01-05 DIAGNOSIS — I110 Hypertensive heart disease with heart failure: Secondary | ICD-10-CM | POA: Insufficient documentation

## 2019-01-05 DIAGNOSIS — Z882 Allergy status to sulfonamides status: Secondary | ICD-10-CM | POA: Diagnosis not present

## 2019-01-05 DIAGNOSIS — I2510 Atherosclerotic heart disease of native coronary artery without angina pectoris: Secondary | ICD-10-CM | POA: Diagnosis not present

## 2019-01-05 DIAGNOSIS — Z79899 Other long term (current) drug therapy: Secondary | ICD-10-CM | POA: Insufficient documentation

## 2019-01-05 DIAGNOSIS — I5042 Chronic combined systolic (congestive) and diastolic (congestive) heart failure: Secondary | ICD-10-CM

## 2019-01-05 DIAGNOSIS — Z885 Allergy status to narcotic agent status: Secondary | ICD-10-CM | POA: Insufficient documentation

## 2019-01-05 DIAGNOSIS — I739 Peripheral vascular disease, unspecified: Secondary | ICD-10-CM

## 2019-01-05 DIAGNOSIS — I255 Ischemic cardiomyopathy: Secondary | ICD-10-CM | POA: Diagnosis not present

## 2019-01-05 DIAGNOSIS — Z888 Allergy status to other drugs, medicaments and biological substances status: Secondary | ICD-10-CM | POA: Insufficient documentation

## 2019-01-05 DIAGNOSIS — F419 Anxiety disorder, unspecified: Secondary | ICD-10-CM | POA: Diagnosis not present

## 2019-01-05 DIAGNOSIS — Z8249 Family history of ischemic heart disease and other diseases of the circulatory system: Secondary | ICD-10-CM | POA: Diagnosis not present

## 2019-01-05 DIAGNOSIS — R0683 Snoring: Secondary | ICD-10-CM | POA: Insufficient documentation

## 2019-01-05 DIAGNOSIS — Z87891 Personal history of nicotine dependence: Secondary | ICD-10-CM | POA: Diagnosis not present

## 2019-01-05 LAB — BRAIN NATRIURETIC PEPTIDE: B Natriuretic Peptide: 150.9 pg/mL — ABNORMAL HIGH (ref 0.0–100.0)

## 2019-01-05 LAB — BASIC METABOLIC PANEL
Anion gap: 7 (ref 5–15)
BUN: 7 mg/dL (ref 6–20)
CHLORIDE: 105 mmol/L (ref 98–111)
CO2: 26 mmol/L (ref 22–32)
Calcium: 9.4 mg/dL (ref 8.9–10.3)
Creatinine, Ser: 1.17 mg/dL (ref 0.61–1.24)
GFR calc Af Amer: >60 mL/min (ref >60)
GFR calc non Af Amer: >60 mL/min (ref >60)
Glucose, Bld: 88 mg/dL (ref 70–99)
Potassium: 4.2 mmol/L (ref 3.5–5.1)
Sodium: 138 mmol/L (ref 135–145)

## 2019-01-05 MED ORDER — SACUBITRIL-VALSARTAN 97-103 MG PO TABS: 1.0000 | ORAL_TABLET | Freq: Two times a day (BID) | ORAL | 6 refills | Status: DC

## 2019-01-05 NOTE — Patient Instructions (Signed)
Labs done today  INCREASE Entresto 97/103 (1 tab) twice daily  Your physician has referred you to the [facility]. They will contact you to set up appointments.  Follow up with Dr. Gala Romney in 3 months

## 2019-01-05 NOTE — Progress Notes (Signed)
[facility] Note   Referring Physician: Joni Reining, NP PCP: Eustaquio Boyden, MD PCP-Cardiologist: Daphene Jaeger, MD (Previously Dr. Herbie Baltimore)  HPI: Calvin Francis is a 58 y.o. male with a history of CAD s/p 4v CABG in 2005, systolic HF due to ICM, HTN, obesity, Barrett's esophagus, and hyperlipidemia.   Has h/o CAD with CABG in 2005. Has done well since that time until recently   Admitted 3/5-3/10/19 with CP. LHC as below. 2/4 CABG grafts occluded. No good revascularization options, so medical therapy was advised. His EF on cath was 10-20% with CO 4.8, CI 2.06. Echo 3/6 showed EF 20-25% with diffuse HK, mild RV dysfunction, mild MR, mild biatrial enlargement. He diuresed 2 lbs with IV lasix. HF meds were optimized, but limited with orthostatic hypotension and AKI (creatinine peak 1.3). He was discharged with a lifevest. DC weight 238 lbs.   Previously stopped zetia and celexa due to leg cramps and dizziness.  Echo 07/13/18 EF 20-25% RV mildly HK  Today he returns for HF follow up. Was seen at [facility] in 12/19 and was told he was not eligible for transplant currently due to nicotine lozenges and + cotinine screen. Legs much better and claudication resolved. Activity level much improved now that legs fixed. Now he is limited more by CP. Can walk through store without too much problem. No orthopnea or PND. No dizziness. Still using nicotine tablets. CPX in 12/19 unchanged with pVO2 10.7. [facility] has suggested losing 25 pounds and needs 6 month free of nicotine.   CPX 12/08/18  FVC 4.43 (80%)    FEV1 3.25 (76%)     FEV1/FVC 73 (94%)      Resting HR: 83 Peak HR: 120  (73% age predicted max HR) BP rest: 104/80 BP peak: 136/80 Peak VO2: 10.7 (44% predicted peak VO2) VE/VCO2 slope: 43 OUES: 1.14 Peak RER:   1.14 Ventilatory Threshold: 9.3 (38% predicted or 87% measured peak VO2) VE/MVV: 41% O2pulse: 10  (59% predicted O2pulse)  RHC  07/28/18 RA = 6 RV = 25/6 PA = 24/14 (18) PCW = 16 Fick cardiac output/index = 5.5/2.3 PVR = 0.4 WU Ao sat = 98% PA sat = 70%, 71% SVC sat = 66%  CPX 04/14/18  Pre-Exercise PFTs FVC 4.35 (79%)    FEV1 3.09 (73%)     FEV1/FVC 71 (92%)     MVV 92 (57%)  RPE: 18 Reason stopped: patient ended test due to leg fatigue and pain (7/10)  Resting HR: 70 Peak HR: 103  (62% age predicted max HR) BP rest: 116/74 BP peak: 130/76 Peak VO2: 11.1 (43% predicted peak VO2) VE/VCO2 slope: 32 OUES: 1.48 Peak RER: 1.14 Ventilatory Threshold: 8.1 (32% predicted and 73% measured peak VO2) VE/MVV: 47% O2pulse: 11  (65% predicted O2pulse)   R/LHC 03/05/18:  2nd Mrg lesion is 65% stenosed.  Ischemic cardiomyopathy, with acute on chronic systolic heart failure.  Angiographic estimation of EF is 10-20%.  Severely elevated LVEDP and moderately elevated pulmonary capillary wedge pressure.  Severe occlusive native vessel disease with occlusion of the proximal to mid LAD, total occlusion of the proximal to mid circumflex, and total occlusion of the mid RCA.  Bypass graft occlusive disease with total occlusion of the SVG to RCA and SVG to diagonal.  Patent LIMA to the LAD with severe diffuse disease in the LAD distal to the graft insertion site.  Patent free radial to the obtuse marginal with 60-70% distal anastomosis stenosis. Fick Cardiac Output 4.81  L/min  Fick Cardiac Output Index 2.06 (L/min)/BSA  RA A Wave 13 mmHg  RA V Wave 14 mmHg  RA Mean 10 mmHg  RV Systolic Pressure 43 mmHg  RV Diastolic Pressure 4 mmHg  RV EDP 12 mmHg  PA Systolic Pressure 45 mmHg  PA Diastolic Pressure 21 mmHg  PA Mean 31 mmHg  PW A Wave 22 mmHg  PW V Wave 34 mmHg  PW Mean 22 mmHg  AO Systolic Pressure 100 mmHg  AO Diastolic Pressure 67 mmHg  AO Mean 81 mmHg  LV Systolic Pressure 96 mmHg  LV Diastolic Pressure 13 mmHg  LV EDP 26 mmHg    Echo 03/03/18: Left ventricle: The cavity size was severely  dilated. Wall   thickness was increased in a pattern of mild LVH. Systolic   function was severely reduced. The estimated ejection fraction   was in the range of 20% to 25%. Diffuse hypokinesis. Doppler   parameters are consistent with restrictive physiology, indicative   of decreased left ventricular diastolic compliance and/or   increased left atrial pressure. Doppler parameters are consistent   with high ventricular filling pressure. - Mitral valve: There was mild regurgitation. - Left atrium: The atrium was mildly dilated. - Right ventricle: The cavity size was mildly dilated. Systolic   function was mildly reduced. - Right atrium: The atrium was mildly dilated.  Echo 2013: EF 40%   Past Medical History:  Diagnosis Date  . 3-vessel CAD 02/22/2004   mid RCA 100% occluded, L-R collaterals; LAD 60-70% bifurcation lesion; Cx proximal 70% and mid 80% after OM1, OM1 100% occluded. --> CABG x 4  . Barrett's esophagus    s/p EGD several years ago with [place]  . Bronchitis, mucopurulent recurrent (HCC)   . CAD (coronary artery disease) of bypass graft 5/'12; 12/'13   Cath for angina and Abn Myoview ST: SVG-OM1 now occluded, attempt at PCI to the native circumflex OM1 unsuccessful; distal LAD beyond patent LIMA 70-80% (not PCI amenable); free radial-OM 2 patent; EF 45-50%  . Erectile dysfunction   . Former heavy cigarette smoker (20-39 per day) May 2013   . GERD (gastroesophageal reflux disease)    ?barrett's, with esophageal dysmotility, on omeprazole  . Glucose intolerance (impaired glucose tolerance)  2009   Prediabetes  . Gout   . History of Non-STEMI (non-ST elevated myocardial infarction) 02/22/2004   Three-vessel disease --> referred for CABG  . History of Non-STEMI (non-ST elevated myocardial infarction) December 2011   Hazy lesion in SVG-OM1 --> staged PCI with 4.0 mm x 20 mm vision BMS (4.5 mm)  . HLD (hyperlipidemia)    Statin intolerant  . Hypertension, benign   .  Ischemic cardiomyopathy Echo 10/2012   EF ~40%; moderate Posterior HypoKinesis, minld-moderate inferior Hypokinesis; mild RV dilation, mild concentric LVH  . 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 Stable angina (HCC) 11/2012   Chronic,some what stable but still present; cardiac cath results above, no significant change from 2012  . Testosterone deficiency    On replacement; goal is low normal.  . Varicose veins December 2013   with venous reflux status VNUS ablation of left greater saphenous wein    Current Outpatient Medications  Medication Sig Dispense Refill  . ALPRAZolam (XANAX) 0.25 MG tablet TAKE 1 TABLET BY MOUTH TWICE DAILY AS NEEDED FOR ANXIETY (Patient taking differently: Take 0.25 mg by mouth 2 (two) times daily as needed for anxiety. ) 60  tablet 3  . aspirin 81 MG chewable tablet Chew 81 mg by mouth daily.    . busPIRone (BUSPAR) 7.5 MG tablet Take 1 tablet (7.5 mg total) by mouth at bedtime. 30 tablet 3  . carvedilol (COREG) 3.125 MG tablet Take 1 tablet (3.125 mg total) by mouth 2 (two) times daily with a meal. 180 tablet 6  . Coenzyme Q10 (COQ10) 200 MG CAPS Take 600 mg by mouth daily.    Marland Kitchen COLCRYS 0.6 MG tablet TAKE AS DIRECTED 2 TABLETS BY MOUTH ON FIRST DAY THEN ONE DAILY AS NEEDED 30 tablet 3  . ENTRESTO 49-51 MG TAKE 1 TABLET BY MOUTH TWICE DAILY 60 tablet 3  . ezetimibe (ZETIA) 10 MG tablet Take 1 tablet (10 mg total) by mouth daily. 90 tablet 0  . fluorouracil (EFUDEX) 5 % cream Apply topically 2 (two) times daily. (Patient taking differently: Apply 1 application topically 2 (two) times daily as needed (ezcema flare up). ) 40 g 1  . furosemide (LASIX) 20 MG tablet Take 1 tablet (20 mg total) by mouth daily. 90 tablet 3  . omeprazole (PRILOSEC OTC) 20 MG tablet Take 40 mg by mouth daily.     . prasugrel (EFFIENT) 10 MG TABS tablet Take 1 tablet (10 mg total) by mouth daily. 90 tablet 3  . ranolazine (RANEXA) 1000 MG SR tablet Take 1  tablet (1,000 mg total) by mouth 2 (two) times daily. 180 tablet 3  . rosuvastatin (CRESTOR) 5 MG tablet Take 1 tablet (5 mg total) by mouth daily. 30 tablet 6  . spironolactone (ALDACTONE) 25 MG tablet Take 0.5 tablets (12.5 mg total) by mouth daily. 45 tablet 3  . vitamin B-12 (CYANOCOBALAMIN) 1000 MCG tablet Take 1,000 mcg by mouth daily.     . fluticasone (FLONASE) 50 MCG/ACT nasal spray Place 2 sprays into both nostrils daily as needed for allergies or rhinitis.    Marland Kitchen nitroGLYCERIN (NITROSTAT) 0.4 MG SL tablet PLACE 1 TABLET UNDER THE TONGUE EVERY 5 MINS AS NEEDED FOR CHEST PAIN (Patient not taking: No sig reported) 25 tablet 3   No current facility-administered medications for this encounter.     Allergies  Allergen Reactions  . Statins Other (See Comments)    cramps  . Zetia [Ezetimibe] Other (See Comments)    cramps  . Clopidogrel Other (See Comments)    Disoriented   . Codeine Rash      Social History   Socioeconomic History  . Marital status: Married    Spouse name: Not on file  . Number of children: Not on file  . Years of education: Not on file  . Highest education level: Not on file  Occupational History  . Not on file  Social Needs  . Financial resource strain: Not on file  . Food insecurity:    Worry: Not on file    Inability: Not on file  . Transportation needs:    Medical: Not on file    Non-medical: Not on file  Tobacco Use  . Smoking status: Former Smoker    Packs/day: 0.50    Years: 20.00    Pack years: 10.00    Types: Cigarettes    Last attempt to quit: 04/28/2012    Years since quitting: 6.6  . Smokeless tobacco: Never Used  . Tobacco comment: long time smoker, smokes occasional -anxiety  Substance and Sexual Activity  . Alcohol use: Yes    Alcohol/week: 0.0 standard drinks    Comment: occasionally  . Drug  use: No  . Sexual activity: Yes  Lifestyle  . Physical activity:    Days per week: Not on file    Minutes per session: Not on file  .  Stress: Not on file  Relationships  . Social connections:    Talks on phone: Not on file    Gets together: Not on file    Attends religious service: Not on file    Active member of club or organization: Not on file    Attends meetings of clubs or organizations: Not on file    Relationship status: Not on file  . Intimate partner violence:    Fear of current or ex partner: Not on file    Emotionally abused: Not on file    Physically abused: Not on file    Forced sexual activity: Not on file  Other Topics Concern  . Not on file  Social History Narrative   Caffeine: 1 cup coffee in am   Lives with wife, 2 dogs   Married, 2 Grown children, 3 grandchildren   Occupation: disability from cardiac status for last year, prior worked for [employer]   Activity: walks driveway, limited by chest pain/SOB    Diet: good water, fruits/vegetables daily      Family History  Problem Relation Age of Onset  . Diabetes Paternal Aunt   . Coronary artery disease Paternal Uncle   . Stroke Maternal Uncle   . Cancer Neg Hx      Wt Readings from Last 3 Encounters:  01/05/19 116.1 kg (256 lb)  12/07/18 114.9 kg (253 lb 3.2 oz)  12/06/18 115.7 kg (255 lb)   Vitals:   01/05/19 0957  BP: 140/72  Pulse: 94  SpO2: 98%  Weight: 116.1 kg (256 lb)    PHYSICAL EXAM: General:  Well appearing. No resp difficulty HEENT: normal Neck: supple. no JVD. Carotids 2+ bilat; no bruits. No lymphadenopathy or thryomegaly appreciated. Cor: PMI nondisplaced. Regular rate & rhythm. No rubs, gallops or murmurs. Lungs: clear Abdomen: soft, nontender, nondistended. No hepatosplenomegaly. No bruits or masses. Good bowel sounds. Extremities: no cyanosis, clubbing, rash, edema Neuro: alert & orientedx3, cranial nerves grossly intact. moves all 4 extremities w/o difficulty. Affect pleasant   ASSESSMENT & PLAN:  1. Systolic HF due to ICM. Echo 2013: EF 40%, Echo 02/2018: EF 20-25% with diffuse HK, mild RV  dysfunction, mild MR, mild biatrial enlargement. EF on cath 10-15% - Echo 07/13/18 EF 20-25% RV mildly HK Personally reviewed - Feeling much better after LE revascularization but CPX still quite limited. Likely NYHA III - CPX 4/19 - pVO2 markedly depressed at 11.1 but slope ok at 32.  - CPX 12/19 - pVO2 10.7 VEVCO2 43 - RHC 7/19 with normal hemodynamics - Volume status ok  - Continue coreg 3.125 mg BID - Increase entresto  97/103 bid mg BID - - Continue spiro 12.5 mg daily. Check labs. - EF remains < 35%. Refer for ICD - [facility] and given goals which we reviewed today including: negative cotinine screens x 6 months, weight loss, mental health counseling - he remains resistant to VAD. Blood Type O-pos - Refer to [facility] exercise program to see if we can improve VO2 with exercise program  2. CAD s/p 4v CABG 2005, LHC 02/2018: 2nd Mrg lesion is 65% stenosed, Severe occlusive native vessel disease with occlusion of the proximal to mid LAD, total occlusion of the proximal to mid circumflex, and total occlusion of the  mid RCA. Bypass graft occlusive disease with total occlusion of the SVG to RCA and SVG to diagonal. Patent LIMA to the LAD with severe diffuse disease in the LAD distal to the graft insertion site. Patent free radial to the obtuse marginal with 60-70% distal anastomosis stenosis. - No s/s ischemia - Continue ASA - Tolerating low-dose pravastatin 20.  Zetia recently restarted. Has consdiered [facility] but says insurance won't pay for it. - Per  Dr. Tresa EndoKelly.   3. HTN - Stable  4. HL - Followed by Dr. Tresa EndoKelly.  - On pravastatin. Zetia recently restarted.   5. Snoring - Per Dr. Tresa EndoKelly.  6. PAD - claudication much improved after LE PCI with Dr. Kirke CorinArida  7. Anxiety - Off celexa due to dizziness. I suspect that this is contributing to his symptoms as well.   Body mass index is 32.87 kg/m.   Total time spent 45 minutes. Over half that time spent discussing  above.    Arvilla Meresaniel Bensimhon, MD 01/05/19

## 2019-01-06 ENCOUNTER — Telehealth (HOSPITAL_COMMUNITY): Payer: Self-pay | Admitting: Licensed Clinical Social Worker

## 2019-01-06 NOTE — Progress Notes (Signed)
BP 124/62 (BP Location: Left Arm, Patient Position: Sitting, Cuff Size: Large)   Pulse 88   Temp 97.6 F (36.4 C) (Oral)   Ht 6' 1.5" (1.867 m)   Wt 254 lb 8 oz (115.4 kg)   SpO2 96%   BMI 33.12 kg/m    CC: cough Subjective:    Patient ID: Calvin Francis, male    DOB: 1961/02/20, 58 y.o.   MRN: [id number]  HPI: Calvin Francis is a 58 y.o. male presenting on 01/07/2019 for Cough (C/o productive cough- worse at night. Started about 2 wks ago. Tried Mucinex, helpful. )   2 wk h/o productive cough, URI symptoms (congestion), body aches. Slowly improving but persistent cough, worse at night time. Rib and back pain from coughing.   No fevers/chills, ST, ear or tooth pain, dyspnea or wheezing.   Treating with mucous relief BID and nyquil with some benefit.  No sick contacts at home. Ex smoker, quit 2013.   Finds buspar has caused vivid dreams      Relevant past medical, surgical, family and social history reviewed and updated as indicated. Interim medical history since our last visit reviewed. Allergies and medications reviewed and updated. Outpatient Medications Prior to Visit  Medication Sig Dispense Refill  . ALPRAZolam (XANAX) 0.25 MG tablet TAKE 1 TABLET BY MOUTH TWICE DAILY AS NEEDED FOR ANXIETY (Patient taking differently: Take 0.25 mg by mouth 2 (two) times daily as needed for anxiety. ) 60 tablet 3  . aspirin 81 MG chewable tablet Chew 81 mg by mouth daily.    . busPIRone (BUSPAR) 7.5 MG tablet Take 1 tablet (7.5 mg total) by mouth at bedtime. 30 tablet 3  . carvedilol (COREG) 3.125 MG tablet Take 1 tablet (3.125 mg total) by mouth 2 (two) times daily with a meal. 180 tablet 6  . Coenzyme Q10 (COQ10) 200 MG CAPS Take 600 mg by mouth daily.    Marland Kitchen COLCRYS 0.6 MG tablet TAKE AS DIRECTED 2 TABLETS BY MOUTH ON FIRST DAY THEN ONE DAILY AS NEEDED 30 tablet 3  . fluorouracil (EFUDEX) 5 % cream Apply topically 2 (two) times daily. (Patient taking differently: Apply 1 application  topically 2 (two) times daily as needed (ezcema flare up). ) 40 g 1  . fluticasone (FLONASE) 50 MCG/ACT nasal spray Place 2 sprays into both nostrils daily as needed for allergies or rhinitis.    . furosemide (LASIX) 20 MG tablet Take 1 tablet (20 mg total) by mouth daily. 90 tablet 3  . nitroGLYCERIN (NITROSTAT) 0.4 MG SL tablet PLACE 1 TABLET UNDER THE TONGUE EVERY 5 MINS AS NEEDED FOR CHEST PAIN 25 tablet 3  . omeprazole (PRILOSEC OTC) 20 MG tablet Take 40 mg by mouth daily.     . prasugrel (EFFIENT) 10 MG TABS tablet Take 1 tablet (10 mg total) by mouth daily. 90 tablet 3  . ranolazine (RANEXA) 1000 MG SR tablet Take 1 tablet (1,000 mg total) by mouth 2 (two) times daily. 180 tablet 3  . rosuvastatin (CRESTOR) 5 MG tablet Take 1 tablet (5 mg total) by mouth daily. 30 tablet 6  . sacubitril-valsartan (ENTRESTO) 97-103 MG Take 1 tablet by mouth 2 (two) times daily. 60 tablet 6  . spironolactone (ALDACTONE) 25 MG tablet Take 0.5 tablets (12.5 mg total) by mouth daily. 45 tablet 3  . vitamin B-12 (CYANOCOBALAMIN) 1000 MCG tablet Take 1,000 mcg by mouth daily.     Marland Kitchen ezetimibe (ZETIA) 10 MG tablet Take 1 tablet (10  mg total) by mouth daily. 90 tablet 0   No facility-administered medications prior to visit.      Per HPI unless specifically indicated in ROS section below Review of Systems Objective:    BP 124/62 (BP Location: Left Arm, Patient Position: Sitting, Cuff Size: Large)   Pulse 88   Temp 97.6 F (36.4 C) (Oral)   Ht 6' 1.5" (1.867 m)   Wt 254 lb 8 oz (115.4 kg)   SpO2 96%   BMI 33.12 kg/m   Wt Readings from Last 3 Encounters:  01/07/19 254 lb 8 oz (115.4 kg)  01/05/19 256 lb (116.1 kg)  12/07/18 253 lb 3.2 oz (114.9 kg)    Physical Exam Vitals signs and nursing note reviewed.  Constitutional:      General: He is not in acute distress.    Appearance: He is well-developed.  HENT:     Head: Normocephalic and atraumatic.     Right Ear: Hearing, tympanic membrane, ear canal  and external ear normal.     Left Ear: Hearing, tympanic membrane, ear canal and external ear normal.     Nose: Mucosal edema (R>L) and congestion present. No rhinorrhea.     Right Sinus: No maxillary sinus tenderness or frontal sinus tenderness.     Left Sinus: No maxillary sinus tenderness or frontal sinus tenderness.     Mouth/Throat:     Pharynx: Oropharynx is clear. Uvula midline. No oropharyngeal exudate or posterior oropharyngeal erythema.     Tonsils: No tonsillar abscesses.  Eyes:     General: No scleral icterus.    Conjunctiva/sclera: Conjunctivae normal.     Pupils: Pupils are equal, round, and reactive to light.  Neck:     Musculoskeletal: Normal range of motion and neck supple.  Cardiovascular:     Rate and Rhythm: Normal rate and regular rhythm.     Heart sounds: Normal heart sounds. No murmur.  Pulmonary:     Effort: Pulmonary effort is normal. No respiratory distress.     Breath sounds: Normal breath sounds. No wheezing or rales.     Comments: Lungs clear Lymphadenopathy:     Cervical: No cervical adenopathy.  Skin:    General: Skin is warm and dry.     Findings: No rash.       Assessment & Plan:   Problem List Items Addressed This Visit    URI with cough and congestion - Primary    Anticipate viral given slow recovery noted.  Will Rx tussionex for night time cough.  Further supportive care reviewed. Update if not improving with treament.           Meds ordered this encounter  Medications  . chlorpheniramine-HYDROcodone (TUSSIONEX PENNKINETIC ER) 10-8 MG/5ML SUER    Sig: Take 5 mLs by mouth 2 (two) times daily as needed for cough.    Dispense:  115 mL    Refill:  0   No orders of the defined types were placed in this encounter.  Patient Instructions  You have a upper respiratory infection with post infectious cough.  Antibiotics are not needed for this. The cough can last a few weeks to go away. Use medication as prescribed: tussionex pennkinetic  for night time cough.  Push fluids and plenty of rest. Please return if you are not improving as expected, or if you have high fevers (>101.5) or difficulty swallowing or worsening productive cough. Call clinic with questions.  Good to see you today. I hope you start feeling better  soon.    Follow up plan: Return if symptoms worsen or fail to improve.  Eustaquio Boyden, MD

## 2019-01-06 NOTE — Telephone Encounter (Signed)
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  CSW called patient who states he was not able to get his new dose at pharmacy because they had already filled his lower dose through the [employer]- if he wanted to get the new dose he would have to pay the copay this month so he went ahead and filled his old dose.  There should be no concerns filling new dose through [employer] next month but they could not fill 2 prescriptions under the foundation for the same month.  Pt now wondering how he should take his medications until he is able to fill new dose.  CSW consulted with RN staff who confirm pt can double current medication to meet new dose and we can supplement with samples once he runs out- pt informed  CSW will continue to follow in clinic and assist as needed  Burna Sis, LCSW Clinical Social Worker [facility] [phone]

## 2019-01-07 ENCOUNTER — Ambulatory Visit (INDEPENDENT_AMBULATORY_CARE_PROVIDER_SITE_OTHER): Payer: Medicare HMO | Admitting: Family Medicine

## 2019-01-07 ENCOUNTER — Encounter: Payer: Self-pay | Admitting: Family Medicine

## 2019-01-07 VITALS — BP 124/62 | HR 88 | Temp 97.6°F | Ht 73.5 in | Wt 254.5 lb

## 2019-01-07 DIAGNOSIS — J069 Acute upper respiratory infection, unspecified: Secondary | ICD-10-CM

## 2019-01-07 MED ORDER — HYDROCOD POLST-CPM POLST ER 10-8 MG/5ML PO SUER: 5.0000 mL | Freq: Two times a day (BID) | ORAL | 0 refills | Status: AC | PRN

## 2019-01-07 NOTE — Patient Instructions (Addendum)
You have a upper respiratory infection with post infectious cough.  Antibiotics are not needed for this. The cough can last a few weeks to go away. Use medication as prescribed: tussionex pennkinetic for night time cough.  Push fluids and plenty of rest. Please return if you are not improving as expected, or if you have high fevers (>101.5) or difficulty swallowing or worsening productive cough. Call clinic with questions.  Good to see you today. I hope you start feeling better soon.

## 2019-01-07 NOTE — Assessment & Plan Note (Signed)
Anticipate viral given slow recovery noted.  Will Rx tussionex for night time cough.  Further supportive care reviewed. Update if not improving with treament.

## 2019-01-11 ENCOUNTER — Other Ambulatory Visit: Payer: Self-pay | Admitting: Cardiovascular Disease

## 2019-01-17 ENCOUNTER — Telehealth: Payer: Self-pay

## 2019-01-17 NOTE — Telephone Encounter (Signed)
Called and left a VM for Mr. Viator regarding the 12-week PREP at the [facility].

## 2019-02-01 ENCOUNTER — Other Ambulatory Visit: Payer: Self-pay | Admitting: Family Medicine

## 2019-02-15 ENCOUNTER — Ambulatory Visit (INDEPENDENT_AMBULATORY_CARE_PROVIDER_SITE_OTHER): Payer: Medicare HMO

## 2019-02-15 DIAGNOSIS — Z23 Encounter for immunization: Secondary | ICD-10-CM

## 2019-02-24 ENCOUNTER — Encounter: Payer: Self-pay | Admitting: Family Medicine

## 2019-02-24 NOTE — Progress Notes (Signed)
BP 124/72 (BP Location: Left Arm, Patient Position: Sitting, Cuff Size: Normal)   Pulse 79   Temp 97.9 F (36.6 C) (Oral)   Ht 6' 1.5" (1.867 m)   Wt 256 lb 2 oz (116.2 kg)   SpO2 96%   BMI 33.33 kg/m    CC: gout f/u visit Subjective:    Patient ID: Calvin Francis, male    DOB: 1961/11/01, 58 y.o.   MRN: [id number]  HPI: Calvin Francis is a 58 y.o. male presenting on 02/25/2019 for Gout (C/o right great toe. Treated previously with prednisone, helpful. Admits he did not f/u to start a daily gout med. Wants to discuss. )   Known gout on colchicine PRN with good effect. Never started allopurinol. Noticing more frequent flares - Q2-3 wks. Always R great toe. Last episode of R podagra was 11/2018. Avoids shrimp, red meats, beer.   On heart transplant list at [facility].  PAD s/p stents bilateral legs with marked relief in claudication symptoms.  Quit smoking 1 yr ago.   Feels he's doing well on buspar 7.5mg  nightly.      Relevant past medical, surgical, family and social history reviewed and updated as indicated. Interim medical history since our last visit reviewed. Allergies and medications reviewed and updated. Outpatient Medications Prior to Visit  Medication Sig Dispense Refill  . ALPRAZolam (XANAX) 0.25 MG tablet TAKE 1 TABLET BY MOUTH TWICE DAILY AS NEEDED FOR ANXIETY (Patient taking differently: Take 0.25 mg by mouth 2 (two) times daily as needed for anxiety. ) 60 tablet 3  . aspirin 81 MG chewable tablet Chew 81 mg by mouth daily.    . busPIRone (BUSPAR) 7.5 MG tablet Take 1 tablet (7.5 mg total) by mouth at bedtime. 30 tablet 3  . carvedilol (COREG) 3.125 MG tablet Take 1 tablet (3.125 mg total) by mouth 2 (two) times daily with a meal. 180 tablet 6  . chlorpheniramine-HYDROcodone (TUSSIONEX PENNKINETIC ER) 10-8 MG/5ML SUER Take 5 mLs by mouth 2 (two) times daily as needed for cough. 115 mL 0  . Coenzyme Q10 (COQ10) 200 MG CAPS Take 600 mg by mouth daily.    Marland Kitchen COLCRYS 0.6 MG  tablet TAKE AS DIRECTED TAKE  TWO  TABLETS  BY  MOUTH  ON  THE  FIRST  DAY,  THEN  ONE  TABLET  DAILY  AS  NEEDED 30 tablet 3  . ENTRESTO 49-51 MG Take 1 tablet by mouth 2 (two) times daily.    Marland Kitchen ezetimibe (ZETIA) 10 MG tablet TAKE 1 TABLET EVERY DAY 90 tablet 0  . fluorouracil (EFUDEX) 5 % cream Apply topically 2 (two) times daily. (Patient taking differently: Apply 1 application topically 2 (two) times daily as needed (ezcema flare up). ) 40 g 1  . fluticasone (FLONASE) 50 MCG/ACT nasal spray Place 2 sprays into both nostrils daily as needed for allergies or rhinitis.    . furosemide (LASIX) 20 MG tablet Take 1 tablet (20 mg total) by mouth daily. 90 tablet 3  . nitroGLYCERIN (NITROSTAT) 0.4 MG SL tablet PLACE 1 TABLET UNDER THE TONGUE EVERY 5 MINS AS NEEDED FOR CHEST PAIN 25 tablet 3  . omeprazole (PRILOSEC OTC) 20 MG tablet Take 40 mg by mouth daily.     . prasugrel (EFFIENT) 10 MG TABS tablet Take 1 tablet (10 mg total) by mouth daily. 90 tablet 3  . ranolazine (RANEXA) 1000 MG SR tablet Take 1 tablet (1,000 mg total) by mouth 2 (two) times daily.  180 tablet 3  . rosuvastatin (CRESTOR) 5 MG tablet Take 1 tablet (5 mg total) by mouth daily. 30 tablet 6  . spironolactone (ALDACTONE) 25 MG tablet Take 0.5 tablets (12.5 mg total) by mouth daily. 45 tablet 3  . vitamin B-12 (CYANOCOBALAMIN) 1000 MCG tablet Take 1,000 mcg by mouth daily.     . sacubitril-valsartan (ENTRESTO) 97-103 MG Take 1 tablet by mouth 2 (two) times daily. 60 tablet 6   No facility-administered medications prior to visit.      Per HPI unless specifically indicated in ROS section below Review of Systems Objective:    BP 124/72 (BP Location: Left Arm, Patient Position: Sitting, Cuff Size: Normal)   Pulse 79   Temp 97.9 F (36.6 C) (Oral)   Ht 6' 1.5" (1.867 m)   Wt 256 lb 2 oz (116.2 kg)   SpO2 96%   BMI 33.33 kg/m   Wt Readings from Last 3 Encounters:  02/25/19 256 lb 2 oz (116.2 kg)  01/07/19 254 lb 8 oz (115.4  kg)  01/05/19 256 lb (116.1 kg)    Physical Exam Vitals signs and nursing note reviewed.  Constitutional:      Appearance: Normal appearance. He is not ill-appearing.  Musculoskeletal:        General: No swelling.     Comments: No significant pain to palpation of R 1st MTPJ Firm deposition of material at R IP joint of 3rd toe  Skin:    Findings: No erythema.     Comments: No redness or warmth of R foot  Neurological:     Mental Status: He is alert.       Results for orders placed or performed during the hospital encounter of 01/05/19  Basic Metabolic Panel (BMET)  Result Value Ref Range   Sodium 138 135 - 145 mmol/L   Potassium 4.2 3.5 - 5.1 mmol/L   Chloride 105 98 - 111 mmol/L   CO2 26 22 - 32 mmol/L   Glucose, Bld 88 70 - 99 mg/dL   BUN 7 6 - 20 mg/dL   Creatinine, Ser 1.61 0.61 - 1.24 mg/dL   Calcium 9.4 8.9 - 09.6 mg/dL   GFR calc non Af Amer >60 >60 mL/min   GFR calc Af Amer >60 >60 mL/min   Anion gap 7 5 - 15  B Nat Peptide  Result Value Ref Range   B Natriuretic Peptide 150.9 (H) 0.0 - 100.0 pg/mL   Lab Results  Component Value Date   LABURIC 8.1 (H) 10/13/2017    Assessment & Plan:  He will return in next few months for CPE Problem List Items Addressed This Visit    Gout    Increased frequency of flares. Currently on tail end of flare. Start allopurinol  daily, provided with prednisone course to fill if gout flare recurs while starting allopurinol. Reviewed colchicine use. Reviewed low purine diet, rec start vit C  daily, discussed tart cherry juice.  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       Other Visit Diagnoses    Podagra    -  Primary   Relevant Medications   allopurinol (ZYLOPRIM) 100 MG tablet   predniSONE (DELTASONE) 20 MG tablet       Meds ordered this encounter  Medications  . allopurinol (ZYLOPRIM) 100 MG tablet    Sig: Take 1 tablet (100 mg total) by mouth daily.    Dispense:  30 tablet    Refill:  6  .  predniSONE (DELTASONE) 20 MG tablet    Sig: Take two tablets daily for 3 days followed by one tablet daily for 4 days    Dispense:  10 tablet    Refill:  0   No orders of the defined types were placed in this encounter.   Follow up plan: No follow-ups on file.  Eustaquio Boyden, MD

## 2019-02-25 ENCOUNTER — Encounter: Payer: Self-pay | Admitting: Family Medicine

## 2019-02-25 ENCOUNTER — Ambulatory Visit (INDEPENDENT_AMBULATORY_CARE_PROVIDER_SITE_OTHER): Payer: Medicare HMO | Admitting: Family Medicine

## 2019-02-25 VITALS — BP 124/72 | HR 79 | Temp 97.9°F | Ht 73.5 in | Wt 256.1 lb

## 2019-02-25 DIAGNOSIS — M109 Gout, unspecified: Secondary | ICD-10-CM | POA: Diagnosis not present

## 2019-02-25 DIAGNOSIS — M1A9XX1 Chronic gout, unspecified, with tophus (tophi): Secondary | ICD-10-CM

## 2019-02-25 MED ORDER — PREDNISONE 20 MG PO TABS: ORAL_TABLET | ORAL | 0 refills | Status: AC

## 2019-02-25 MED ORDER — ALLOPURINOL 100 MG PO TABS: 100.0000 mg | ORAL_TABLET | Freq: Every day | ORAL | 6 refills | Status: AC

## 2019-02-25 NOTE — Patient Instructions (Addendum)
Start allopurinol 100mg  daily for gout prevention. If gout flare recurring after starting allopurinol, may take prednisone taper sent to pharmacy.  Continue colchicine as needed.  Consider vitamin C 500mg  daily as it can help gout, or tart cherry juice.  Schedule physical at your convenience.   Gout  Gout is a condition that causes painful swelling of the joints. Gout is a type of inflammation of the joints (arthritis). This condition is caused by having too much uric acid in the body. Uric acid is a chemical that forms when the body breaks down substances called purines. Purines are important for building body proteins. When the body has too much uric acid, sharp crystals can form and build up inside the joints. This causes pain and swelling. Gout attacks can happen quickly and may be very painful (acute gout). Over time, the attacks can affect more joints and become more frequent (chronic gout). Gout can also cause uric acid to build up under the skin and inside the kidneys. What are the causes? This condition is caused by too much uric acid in your blood. This can happen because:  Your kidneys do not remove enough uric acid from your blood. This is the most common cause.  Your body makes too much uric acid. This can happen with some cancers and cancer treatments. It can also occur if your body is breaking down too many red blood cells (hemolytic anemia).  You eat too many foods that are high in purines. These foods include organ meats and some seafood. Alcohol, especially beer, is also high in purines. A gout attack may be triggered by trauma or stress. What increases the risk? You are more likely to develop this condition if you:  Have a family history of gout.  Are male and middle-aged.  Are male and have gone through menopause.  Are obese.  Frequently drink alcohol, especially beer.  Are dehydrated.  Lose weight too quickly.  Have an organ transplant.  Have lead  poisoning.  Take certain medicines, including aspirin, cyclosporine, diuretics, levodopa, and niacin.  Have kidney disease.  Have a skin condition called psoriasis. What are the signs or symptoms? An attack of acute gout happens quickly. It usually occurs in just one joint. The most common place is the big toe. Attacks often start at night. Other joints that may be affected include joints of the feet, ankle, knee, fingers, wrist, or elbow. Symptoms of this condition may include:  Severe pain.  Warmth.  Swelling.  Stiffness.  Tenderness. The affected joint may be very painful to touch.  Shiny, red, or purple skin.  Chills and fever. Chronic gout may cause symptoms more frequently. More joints may be involved. You may also have white or yellow lumps (tophi) on your hands or feet or in other areas near your joints. How is this diagnosed? This condition is diagnosed based on your symptoms, medical history, and physical exam. You may have tests, such as:  Blood tests to measure uric acid levels.  Removal of joint fluid with a thin needle (aspiration) to look for uric acid crystals.  X-rays to look for joint damage. How is this treated? Treatment for this condition has two phases: treating an acute attack and preventing future attacks. Acute gout treatment may include medicines to reduce pain and swelling, including:  NSAIDs.  Steroids. These are strong anti-inflammatory medicines that can be taken by mouth (orally) or injected into a joint.  Colchicine. This medicine relieves pain and swelling when it is  taken soon after an attack. It can be given by mouth or through an IV. Preventive treatment may include:  Daily use of smaller doses of NSAIDs or colchicine.  Use of a medicine that reduces uric acid levels in your blood.  Changes to your diet. You may need to see a dietitian about what to eat and drink to prevent gout. Follow these instructions at home: During a gout  attack   If directed, put ice on the affected area: ? Put ice in a plastic bag. ? Place a towel between your skin and the bag. ? Leave the ice on for 20 minutes, 2-3 times a day.  Raise (elevate) the affected joint above the level of your heart as often as possible.  Rest the joint as much as possible. If the affected joint is in your leg, you may be given crutches to use.  Follow instructions from your health care provider about eating or drinking restrictions. Avoiding future gout attacks  Follow a low-purine diet as told by your dietitian or health care provider. Avoid foods and drinks that are high in purines, including liver, kidney, anchovies, asparagus, herring, mushrooms, mussels, and beer.  Maintain a healthy weight or lose weight if you are overweight. If you want to lose weight, talk with your health care provider. It is important that you do not lose weight too quickly.  Start or maintain an exercise program as told by your health care provider. Eating and drinking  Drink enough fluids to keep your urine pale yellow.  If you drink alcohol: ? Limit how much you use to:  0-1 drink a day for women.  0-2 drinks a day for men. ? Be aware of how much alcohol is in your drink. In the [place] one drink equals one 12 oz bottle of beer (355 mL) one 5 oz glass of wine (148 mL), or one 1 oz glass of hard liquor (44 mL). General instructions  Take over-the-counter and prescription medicines only as told by your health care provider.  Do not drive or use heavy machinery while taking prescription pain medicine.  Return to your normal activities as told by your health care provider. Ask your health care provider what activities are safe for you.  Keep all follow-up visits as told by your health care provider. This is important. Contact a health care provider if you have:  Another gout attack.  Continuing symptoms of a gout attack after 10 days of treatment.  Side effects from  your medicines.  Chills or a fever.  Burning pain when you urinate.  Pain in your lower back or belly. Get help right away if you:  Have severe or uncontrolled pain.  Cannot urinate. Summary  Gout is painful swelling of the joints caused by inflammation.  The most common site of pain is the big toe, but it can affect other joints in the body.  Medicines and dietary changes can help to prevent and treat gout attacks. This information is not intended to replace advice given to you by your health care provider. Make sure you discuss any questions you have with your health care provider. Document Released: 12/12/2000 Document Revised: 07/07/2018 Document Reviewed: 07/07/2018 Elsevier Interactive Patient Education  2019 [employer].

## 2019-02-25 NOTE — Assessment & Plan Note (Signed)
Increased frequency of flares. Currently on tail end of flare. Start allopurinol 100mg  daily, provided with prednisone course to fill if gout flare recurs while starting allopurinol. Reviewed colchicine use. Reviewed low purine diet, rec start vit C 500mg  daily, discussed tart cherry juice.  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

## 2019-02-28 ENCOUNTER — Ambulatory Visit: Payer: Medicare HMO | Admitting: Cardiovascular Disease

## 2019-03-03 ENCOUNTER — Encounter (HOSPITAL_COMMUNITY): Payer: Self-pay | Admitting: Emergency Medicine

## 2019-03-03 ENCOUNTER — Emergency Department (HOSPITAL_COMMUNITY)
Admission: EM | Admit: 2019-03-03 | Discharge: 2019-03-30 | Disposition: E | Payer: Medicare HMO | Attending: Emergency Medicine | Admitting: Emergency Medicine

## 2019-03-03 DIAGNOSIS — I509 Heart failure, unspecified: Secondary | ICD-10-CM | POA: Diagnosis not present

## 2019-03-03 DIAGNOSIS — R001 Bradycardia, unspecified: Secondary | ICD-10-CM | POA: Diagnosis not present

## 2019-03-03 DIAGNOSIS — I110 Hypertensive heart disease with heart failure: Secondary | ICD-10-CM | POA: Insufficient documentation

## 2019-03-03 DIAGNOSIS — R404 Transient alteration of awareness: Secondary | ICD-10-CM | POA: Diagnosis not present

## 2019-03-03 DIAGNOSIS — Z7982 Long term (current) use of aspirin: Secondary | ICD-10-CM | POA: Insufficient documentation

## 2019-03-03 DIAGNOSIS — I469 Cardiac arrest, cause unspecified: Secondary | ICD-10-CM | POA: Diagnosis not present

## 2019-03-03 DIAGNOSIS — I252 Old myocardial infarction: Secondary | ICD-10-CM | POA: Insufficient documentation

## 2019-03-03 DIAGNOSIS — R55 Syncope and collapse: Secondary | ICD-10-CM | POA: Diagnosis present

## 2019-03-03 DIAGNOSIS — Z87891 Personal history of nicotine dependence: Secondary | ICD-10-CM | POA: Diagnosis not present

## 2019-03-03 DIAGNOSIS — Z955 Presence of coronary angioplasty implant and graft: Secondary | ICD-10-CM | POA: Insufficient documentation

## 2019-03-03 DIAGNOSIS — I4901 Ventricular fibrillation: Secondary | ICD-10-CM | POA: Diagnosis not present

## 2019-03-03 DIAGNOSIS — I2510 Atherosclerotic heart disease of native coronary artery without angina pectoris: Secondary | ICD-10-CM | POA: Diagnosis not present

## 2019-03-03 DIAGNOSIS — Z79899 Other long term (current) drug therapy: Secondary | ICD-10-CM | POA: Insufficient documentation

## 2019-03-03 DIAGNOSIS — R000 Tachycardia, unspecified: Secondary | ICD-10-CM | POA: Diagnosis not present

## 2019-03-03 DIAGNOSIS — F329 Major depressive disorder, single episode, unspecified: Secondary | ICD-10-CM | POA: Diagnosis not present

## 2019-03-03 DIAGNOSIS — R4020 Unspecified coma: Secondary | ICD-10-CM | POA: Diagnosis not present

## 2019-03-03 MED ORDER — SODIUM CHLORIDE 0.9 % IV SOLN
INTRAVENOUS | Status: AC | PRN
  Administered 2019-03-03: 1000 mL via INTRAVENOUS

## 2019-03-03 MED ORDER — EPINEPHRINE PF 1 MG/10ML IJ SOSY
PREFILLED_SYRINGE | INTRAMUSCULAR | Status: AC | PRN
  Administered 2019-03-03 (×3): 1 via INTRAVENOUS

## 2019-03-03 MED ORDER — SODIUM BICARBONATE 8.4 % IV SOLN
INTRAVENOUS | Status: AC | PRN
  Administered 2019-03-03 (×2): 50 meq via INTRAVENOUS

## 2019-03-04 ENCOUNTER — Telehealth: Payer: Self-pay | Admitting: Family Medicine

## 2019-03-04 NOTE — Telephone Encounter (Signed)
Called to speak with wife, unable to reach will try later

## 2019-03-07 ENCOUNTER — Ambulatory Visit: Payer: Medicare HMO | Admitting: Cardiology

## 2019-03-07 ENCOUNTER — Encounter: Payer: Self-pay | Admitting: Family Medicine

## 2019-03-07 NOTE — Telephone Encounter (Signed)
Spoke with wife, expressed my condolences.  

## 2019-03-08 ENCOUNTER — Telehealth: Payer: Self-pay

## 2019-03-08 NOTE — Telephone Encounter (Signed)
Calvin Francis with [employer] left v/m requesting cb to pts wife; needing dx codes for critical illness policy. Pt passed away last week.

## 2019-03-09 NOTE — Telephone Encounter (Signed)
Dawn, can you help me with this request? I am happy to call the spouse, I just need the dx codes.

## 2019-03-09 NOTE — Telephone Encounter (Signed)
Hey,  I can give you the codes but you may want to check with provider and then let me know which illness would be appropriate for this, then I can give you the codes.  I don't want to give the wrong ones.   He has a bunch of diagnosis.  Thanks [employer]

## 2019-03-10 NOTE — Telephone Encounter (Signed)
Would be anything heart related - so  I25.119  I25.5 I25.810 Z98.61 Z95.1 I73.9 I50.9 Is that too much?

## 2019-03-10 NOTE — Telephone Encounter (Signed)
I spoke with Calvin Francis and passed on dx codes. I also provided her with the fax in case we need to fill out further ppw.

## 2019-03-10 NOTE — Telephone Encounter (Signed)
Dr Sharen Hones, can you help me with this? Pt's spouse is needing dx codes for critical illness policy.

## 2019-03-14 ENCOUNTER — Encounter (HOSPITAL_COMMUNITY): Payer: Self-pay | Admitting: *Deleted

## 2019-03-14 NOTE — Progress Notes (Signed)
Death Certificate completed and signed by Dr Gala Romney, mailed to [facility].

## 2019-03-30 NOTE — ED Notes (Signed)
TOD 0407

## 2019-03-30 NOTE — Progress Notes (Signed)
RT assisted ER MD with tube exchange from king airway to a 8.0 ETT. Pt receiving CPR at time of exchange.

## 2019-03-30 NOTE — ED Notes (Signed)
BIB EMS from home, unwitnessed Cardiac Arrest. Pt was found unresponsive upstairs by wife, unknown LSN. Wife attempted to start CPR but unable to get pt moved to floor. Fire arrived and started CPR 0254. Initial rhythm vfib. Shocked X2. Given total 300 Amiodarone, 8 Epi, NS. Rhythm upon arrival PEA. King airway in place, I/O L tib. CBG 245.

## 2019-03-30 NOTE — ED Notes (Signed)
Taken to [facility] by ED Staff

## 2019-03-30 NOTE — ED Provider Notes (Signed)
[facility] EMERGENCY DEPARTMENT Provider Note   CSN: [id number] Arrival date & time: 2019-03-25  0356    History   Chief Complaint Chief Complaint  Patient presents with  . Cardiac Arrest    HPI Calvin Francis is a 58 y.o. male.     Level 5 caveat for acuity of condition.  Patient presents with CPR in progress via EMS.  They report he is "on the heart transplant list".  He apparently collapsed upstairs and was unwitnessed.  CPR was started 254 by the fire department.  Initial rhythm was ventricular fibrillation and patient was shocked twice and given 300 mg amiodarone.  Rhythm progressed to PEA and CPR was continued. patient was given a total of 8 mg of epinephrine.  He did convert to a PEA rhythm and was in PEA during transport.  EMS reports he has been on asystole since they pulled in about 5 minutes ago.  CPR has been ongoing for about 50 minutes.  Patient's wife has arrived.  States he went to bed normally around 9 PM and did not complain of any chest pain or shortness of breath prior to going to sleep.  She was awoken by him around 2:30 AM "gasping for breath and making strange noises".  He seemed like he could not breathe but would not speak.  He was able to get up to the edge of the bed but did not speak.  He then fell backwards and lost consciousness.  Wife called EMS at that time and did not check for a pulse.  The history is provided by the EMS personnel and a relative.  Cardiac Arrest    Past Medical History:  Diagnosis Date  . 3-vessel CAD 02/22/2004   mid RCA 100% occluded, L-R collaterals; LAD 60-70% bifurcation lesion; Cx proximal 70% and mid 80% after OM1, OM1 100% occluded. --> CABG x 4  . Barrett's esophagus    s/p EGD several years ago with [place]  . Bronchitis, mucopurulent recurrent ([place])   . CAD (coronary artery disease) of bypass graft 5/'12; 12/'13   Cath for angina and Abn Myoview ST: SVG-OM1 now occluded, attempt at PCI to the native  circumflex OM1 unsuccessful; distal LAD beyond patent LIMA 70-80% (not PCI amenable); free radial-OM 2 patent; EF 45-50%  . Erectile dysfunction   . Former heavy cigarette smoker (20-39 per day) May 2013   . GERD (gastroesophageal reflux disease)    ?barrett's, with esophageal dysmotility, on omeprazole  . Glucose intolerance (impaired glucose tolerance)  2009   Prediabetes  . Gout   . History of Non-STEMI (non-ST elevated myocardial infarction) 02/22/2004   Three-vessel disease --> referred for CABG  . History of Non-STEMI (non-ST elevated myocardial infarction) December 2011   Hazy lesion in SVG-OM1 --> staged PCI with 4.0 mm x 20 mm vision BMS (4.5 mm)  . HLD (hyperlipidemia)    Statin intolerant  . Hypertension, benign   . Ischemic cardiomyopathy Echo 10/2012   EF ~40%; moderate Posterior HypoKinesis, minld-moderate inferior Hypokinesis; mild RV dilation, mild concentric LVH  . 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 Stable angina ([place]) 11/2012   Chronic,some what stable but still present; cardiac cath results above, no significant change from 2012  . Testosterone deficiency    On replacement; goal is low normal.  . Varicose veins December 2013   with venous reflux status VNUS ablation of left greater saphenous wein  Patient Active Problem List   Diagnosis Date Noted  . Hepatic steatosis 12/12/2018  . Acute pain of left shoulder 03/29/2018  . Congestive heart failure ([place])   . Non-STEMI (non-ST elevated myocardial infarction) ([place])   . Elevated troponin I level 03/04/2018  . Chest pain Feb 27, 202019  . Acquired bilateral ankle pronation 01/18/2018  . PAD (peripheral artery disease) ([place]) 01/18/2018  . Gout   . Headache 04/29/2017  . Health maintenance examination 10/13/2016  . Obstructive sleep apnea of adult 01/17/2016  . Medicare annual wellness visit, subsequent 11/21/2014  . Advanced care planning/counseling discussion 11/21/2014  .  Varicose veins of leg with swelling 09/18/2013  . Severe obesity (BMI 35.0-39.9) with comorbidity ([place]) 09/18/2013  . CAD (coronary artery disease) of bypass graft   . Depression with anxiety 09/06/2013  . ED (erectile dysfunction) of organic origin 09/06/2013  . Ischemic cardiomyopathy   . Prediabetes 08/15/2013  . Stable angina: Class I-II 12/27/2012  . Abnormal cardiovascular stress tests - METS test with VO2 of ~50, HIGH Risk Myoview - Anterior Ischemia 12/15/2012  . Hypogonadism male   . Atherosclerotic heart disease of native coronary artery with angina pectoris ([place])   . Barrett's esophagus   . Essential hypertension   . Hyperlipidemia with target LDL less than 70 with statin related symptoms   . Ex-smoker   . History of percutaneous coronary intervention 12/28/2010  . Statin intolerance 12/05/2009  . 3-vessel CAD 02/22/2004  . S/P CABG x 4 01/30/2004    Past Surgical History:  Procedure Laterality Date  . ABDOMINAL AORTOGRAM W/LOWER EXTREMITY N/A 09/08/2018   Procedure: ABDOMINAL AORTOGRAM W/LOWER EXTREMITY;  Surgeon: Wellington Hampshire, MD;  Location: [facility] CV LAB;  Service: Cardiovascular;  Laterality: N/A;  . CARDIAC CATHETERIZATION  May 01 2011   knowwn occlusion of SVG to RCA  as well as native RCA ; PCI to the SVG to OM1 ;patent LIMA to LAD with diffuse distal  LAD 70-80%,small  vessel dx not thought to be amenable to PCI .RCA 100% occluded ,OM2 patent,Circ diseased aftr OM1. EF 45-50%  . CORONARY ANGIOPLASTY WITH STENT PLACEMENT  12/25/2010   s/p autologous vessel blockage  . CORONARY ARTERY BYPASS GRAFT  02/27/04   LIMA -LAD, Free Radial-OM2 -- patent; SVG-OM1, SVG-RCA, - 100% occluded by recent cath  . ESOPHAGOGASTRODUODENOSCOPY    . LEFT HEART CATHETERIZATION WITH CORONARY/GRAFT ANGIOGRAM N/A 12/27/2012   Procedure: LEFT HEART CATHETERIZATION WITH Beatrix Fetters;  Surgeon: Leonie Man, MD;  Location: [facility] CATH LAB: Known nRCA, mLAD, OM1 & OM2 100%,  Known SVG-RCA 100%, SVG-OM1 100% ISR. Patent LIMA-dLAD w/ 60-80% dLAD. Patent frRAD-OM2. LCx-RPL collaterals.   EF ~35%  . lower venous extremity doppler Left 08/15/2011   normal ,s/p vein ablation  . MET/CPET  11/02/2012   suboptimal effort but peak VO2  was 52% which relatively significant. Myoview was suggestive for ant. ischemia may go along with his distal LAD   . NM MYOCAR PERF WALL MOTION  12/15/2012   high risk study  . PERIPHERAL VASCULAR ATHERECTOMY Right 10/27/2018   Procedure: PERIPHERAL VASCULAR ATHERECTOMY;  Surgeon: Wellington Hampshire, MD;  Location: [facility] CV LAB;  Service: Cardiovascular;  Laterality: Right;  superficial femoral  . PERIPHERAL VASCULAR ATHERECTOMY N/A 11/17/2018   Procedure: PERIPHERAL VASCULAR ATHERECTOMY;  Surgeon: Wellington Hampshire, MD;  Location: [facility] CV LAB;  Service: Cardiovascular;  Laterality: N/A;  . PERIPHERAL VASCULAR BALLOON ANGIOPLASTY  11/17/2018   Procedure: PERIPHERAL VASCULAR BALLOON ANGIOPLASTY;  Surgeon:  Wellington Hampshire, MD;  Location: [facility] CV LAB;  Service: Cardiovascular;;  Left SFA popliteal  . RIGHT HEART CATH N/A 07/28/2018   Procedure: RIGHT HEART CATH;  Surgeon: Jolaine Artist, MD;  Location: [facility] CV LAB;  Service: Cardiovascular;  Laterality: N/A;  . RIGHT/LEFT HEART CATH AND CORONARY/GRAFT ANGIOGRAPHY N/A 03/05/2018   Procedure: RIGHT/LEFT HEART CATH AND CORONARY/GRAFT ANGIOGRAPHY;  Surgeon: Belva Crome, MD;  Location: [facility] LAB;  Service: Cardiovascular;  Laterality: N/A;  . TRANSTHORACIC ECHOCARDIOGRAM  10/2012   mild LVH, EF 40%, mod posterior and inf wall hypokinesis,mild concentric LVH;; RV dilated, normal  fx.trace MR  . VEIN SURGERY  2011   solomon - h/o vericose veins        Home Medications    Prior to Admission medications   Medication Sig Start Date End Date Taking? Authorizing Provider  allopurinol (ZYLOPRIM) 100 MG tablet Take 1 tablet (100 mg total) by mouth daily. 02/25/19    Ria Bush, MD  ALPRAZolam Duanne Moron) 0.25 MG tablet TAKE 1 TABLET BY MOUTH TWICE DAILY AS NEEDED FOR ANXIETY Patient taking differently: Take 0.25 mg by mouth 2 (two) times daily as needed for anxiety.  09/03/18   Ria Bush, MD  aspirin 81 MG chewable tablet Chew 81 mg by mouth daily.    [provider]  busPIRone (BUSPAR) 7.5 MG tablet Take 1 tablet (7.5 mg total) by mouth at bedtime. 12/06/18   Ria Bush, MD  carvedilol (COREG) 3.125 MG tablet Take 1 tablet (3.125 mg total) by mouth 2 (two) times daily with a meal. 05/04/18   Bensimhon, Shaune Pascal, MD  chlorpheniramine-HYDROcodone (TUSSIONEX PENNKINETIC ER) 10-8 MG/5ML SUER Take 5 mLs by mouth 2 (two) times daily as needed for cough. 01/07/19   Ria Bush, MD  Coenzyme Q10 (COQ10) 200 MG CAPS Take 600 mg by mouth daily.    [provider]  COLCRYS 0.6 MG tablet TAKE AS DIRECTED TAKE  TWO  TABLETS  BY  MOUTH  ON  THE  FIRST  DAY,  THEN  ONE  TABLET  DAILY  AS  NEEDED 02/01/19   Ria Bush, MD  ENTRESTO 49-51 MG Take 1 tablet by mouth 2 (two) times daily. 02/10/19   [provider]  ezetimibe (ZETIA) 10 MG tablet TAKE 1 TABLET EVERY DAY 01/12/19   Arida, Muammar A, MD  fluorouracil (EFUDEX) 5 % cream Apply topically 2 (two) times daily. Patient taking differently: Apply 1 application topically 2 (two) times daily as needed (ezcema flare up).  11/07/14   Hyatt, Max T, DPM  fluticasone (FLONASE) 50 MCG/ACT nasal spray Place 2 sprays into both nostrils daily as needed for allergies or rhinitis.    [provider]  furosemide (LASIX) 20 MG tablet Take 1 tablet (20 mg total) by mouth daily. 05/03/18   Lendon Colonel, NP  nitroGLYCERIN (NITROSTAT) 0.4 MG SL tablet PLACE 1 TABLET UNDER THE TONGUE EVERY 5 MINS AS NEEDED FOR CHEST PAIN 09/07/18   Ria Bush, MD  omeprazole (PRILOSEC OTC) 20 MG tablet Take 40 mg by mouth daily.     [provider]  prasugrel (EFFIENT) 10 MG TABS  tablet Take 1 tablet (10 mg total) by mouth daily. 05/03/18   Lendon Colonel, NP  predniSONE (DELTASONE) 20 MG tablet Take two tablets daily for 3 days followed by one tablet daily for 4 days 02/25/19   Ria Bush, MD  ranolazine (RANEXA) 1000 MG SR tablet Take 1 tablet (1,000  mg total) by mouth 2 (two) times daily. 10/12/18   Leonie Man, MD  rosuvastatin (CRESTOR) 5 MG tablet Take 1 tablet (5 mg total) by mouth daily. 12/07/18 03/07/19  Wellington Hampshire, MD  spironolactone (ALDACTONE) 25 MG tablet Take 0.5 tablets (12.5 mg total) by mouth daily. 05/04/18   Bensimhon, Shaune Pascal, MD  vitamin B-12 (CYANOCOBALAMIN) 1000 MCG tablet Take 1,000 mcg by mouth daily.     [provider]    Family History Family History  Problem Relation Age of Onset  . Diabetes Paternal Aunt   . Coronary artery disease Paternal Uncle   . Stroke Maternal Uncle   . Cancer Neg Hx     Social History Social History   Tobacco Use  . Smoking status: Former Smoker    Packs/day: 0.50    Years: 20.00    Pack years: 10.00    Types: Cigarettes    Last attempt to quit: 04/28/2012    Years since quitting: 6.8  . Smokeless tobacco: Never Used  . Tobacco comment: long time smoker, smokes occasional -anxiety  Substance Use Topics  . Alcohol use: Yes    Alcohol/week: 0.0 standard drinks    Comment: occasionally  . Drug use: No     Allergies   Statins; Zetia [ezetimibe]; Clopidogrel; and Codeine   Review of Systems Review of Systems  Unable to perform ROS: Acuity of condition     Physical Exam Updated Vital Signs There were no vitals taken for this visit.  Physical Exam Constitutional:      Appearance: He is obese. He is ill-appearing.     Comments: Unresponsive, CPR in progress  Eyes:     Comments: Pupils fixed and dilated  Cardiovascular:     Comments: Pulses present along with compressions Pulmonary:     Comments: Equal breath sounds with bagging, no crepitance Abdominal:      General: There is distension.  Musculoskeletal:     Comments: I/O left tibia  Skin:    Comments: Cool and mottled, cyanotic from the neck up  Neurological:     Comments: Unresponsive, no spontaneous movement      ED Treatments / Results  Labs (all labs ordered are listed, but only abnormal results are displayed) Labs Reviewed - No data to display  EKG None  Radiology No results found.  Procedures Procedure Name: Intubation Date/Time: 03-09-19 4:53 AM Performed by: Ezequiel Essex, MD Pre-anesthesia Checklist: Patient identified, Emergency Drugs available, Patient being monitored, Timeout performed and Suction available Oxygen Delivery Method: Ambu bag Preoxygenation: Pre-oxygenation with 100% oxygen Ventilation: Mask ventilation without difficulty Laryngoscope Size: Mac and 4 Grade View: Grade II Tube type: Subglottic suction tube Tube size: 7.5 mm Number of attempts: 1 Airway Equipment and Method: Rigid stylet and Video-laryngoscopy Placement Confirmation: ETT inserted through vocal cords under direct vision Secured at: 25 cm Tube secured with: ETT holder Dental Injury: Teeth and Oropharynx as per pre-operative assessment  Difficulty Due To: Difficulty was anticipated      (including critical care time)  Medications Ordered in ED Medications  EPINEPHrine (ADRENALIN) 1 MG/10ML injection (1 Syringe Intravenous Given Mar 09, 2019 0404)  sodium bicarbonate injection (50 mEq Intravenous Given 03/09/19 0402)  0.9 %  sodium chloride infusion ( Intravenous Stopped 09-Mar-2019 0408)     Initial Impression / Assessment and Plan / ED Course  I have reviewed the triage vital signs and the nursing notes.  Pertinent labs & imaging results that were available during my care of the patient  were reviewed by me and considered in my medical decision making (see chart for details).       Patient arrives with CPR in progress.  Did receive 2 shocks for ventricular fibrillation.   Remained in PEA and progressed to asystole.  CPR continued on arrival.  Patient given additional doses of epinephrine as well as IV bicarb and calcium. Airway exchange for endotracheal tube as above.  Patient remained in asystole throughout ED stay.  No cardiac activity on bedside ultrasound.  Death was pronounced at 4:06 AM.  Family updated. Messages sent to patient's cardiologist Dr. Haroldine Laws as well as PCP Dr. Danise Mina. D/w NP Amy for HF clinic.    Cardiopulmonary Resuscitation (CPR) Procedure Note Directed/Performed by: Ezequiel Essex I personally directed ancillary staff and/or performed CPR in an effort to regain return of spontaneous circulation and to maintain cardiac, neuro and systemic perfusion.    CRITICAL CARE Performed by: Ezequiel Essex Total critical care time: 35 minutes Critical care time was exclusive of separately billable procedures and treating other patients. Critical care was necessary to treat or prevent imminent or life-threatening deterioration. Critical care was time spent personally by me on the following activities: development of treatment plan with patient and/or surrogate as well as nursing, discussions with consultants, evaluation of patient's response to treatment, examination of patient, obtaining history from patient or surrogate, ordering and performing treatments and interventions, ordering and review of laboratory studies, ordering and review of radiographic studies, pulse oximetry and re-evaluation of patient's condition.  Final Clinical Impressions(s) / ED Diagnoses   Final diagnoses:  Cardiac arrest [facility])    ED Discharge Orders    None       Ezequiel Essex, MD March 08, 2019 [phone]

## 2019-03-30 NOTE — Code Documentation (Signed)
Patient time of death occurred at 40

## 2019-03-30 NOTE — Progress Notes (Signed)
   March 12, 2019 0400  Clinical Encounter Type  Visited With Family  Visit Type Initial;Psychological support;Spiritual support;Death;ED  Referral From Nurse  Consult/Referral To Chaplain  Spiritual Encounters  Spiritual Needs Emotional;Other (Comment);Grief support (Spiritual Care Conversation/Support)  Stress Factors  Family Stress Factors Loss   I visited with the patient's family with the doctor and nurse. I provided grief support for the family.    Chaplain Clint Bolder M.Div., [facility]

## 2019-03-30 DEATH — deceased

## 2019-04-29 ENCOUNTER — Other Ambulatory Visit: Payer: Medicare HMO

## 2019-05-05 ENCOUNTER — Encounter: Payer: Medicare HMO | Admitting: Family Medicine

## 2019-06-08 ENCOUNTER — Encounter (HOSPITAL_COMMUNITY): Payer: Medicare HMO

## 2019-06-16 ENCOUNTER — Ambulatory Visit: Payer: Medicare HMO
